# Patient Record
Sex: Female | Born: 1950
Health system: Southern US, Community
[De-identification: ages and names within clinical notes are randomized; demographics above are authoritative.]

## PROBLEM LIST (undated history)

## (undated) DIAGNOSIS — E785 Hyperlipidemia, unspecified: Secondary | ICD-10-CM

## (undated) DIAGNOSIS — E039 Hypothyroidism, unspecified: Secondary | ICD-10-CM

## (undated) DIAGNOSIS — M25511 Pain in right shoulder: Secondary | ICD-10-CM

## (undated) DIAGNOSIS — F419 Anxiety disorder, unspecified: Secondary | ICD-10-CM

## (undated) DIAGNOSIS — Z780 Asymptomatic menopausal state: Secondary | ICD-10-CM

## (undated) DIAGNOSIS — Z923 Personal history of irradiation: Secondary | ICD-10-CM

## (undated) DIAGNOSIS — C801 Malignant (primary) neoplasm, unspecified: Secondary | ICD-10-CM

## (undated) DIAGNOSIS — M1990 Unspecified osteoarthritis, unspecified site: Secondary | ICD-10-CM

## (undated) DIAGNOSIS — I10 Essential (primary) hypertension: Secondary | ICD-10-CM

## (undated) DIAGNOSIS — F32A Depression, unspecified: Secondary | ICD-10-CM

## (undated) DIAGNOSIS — M810 Age-related osteoporosis without current pathological fracture: Secondary | ICD-10-CM

## (undated) DIAGNOSIS — Z9221 Personal history of antineoplastic chemotherapy: Secondary | ICD-10-CM

## (undated) DIAGNOSIS — J45909 Unspecified asthma, uncomplicated: Secondary | ICD-10-CM

## (undated) HISTORY — DX: Anxiety disorder, unspecified: F41.9

## (undated) HISTORY — PX: OTHER SURGICAL HISTORY: SHX169

## (undated) HISTORY — DX: Hyperlipidemia, unspecified: E78.5

## (undated) HISTORY — DX: Pain in right shoulder: M25.511

## (undated) HISTORY — DX: Asymptomatic menopausal state: Z78.0

## (undated) HISTORY — DX: Unspecified osteoarthritis, unspecified site: M19.90

## (undated) HISTORY — DX: Unspecified asthma, uncomplicated: J45.909

---

## 2005-05-24 ENCOUNTER — Ambulatory Visit: Payer: Self-pay | Admitting: Unknown Physician Specialty

## 2007-05-19 ENCOUNTER — Ambulatory Visit: Payer: Self-pay | Admitting: Unknown Physician Specialty

## 2008-10-28 ENCOUNTER — Ambulatory Visit: Payer: Self-pay | Admitting: Unknown Physician Specialty

## 2010-01-12 ENCOUNTER — Ambulatory Visit: Payer: Self-pay | Admitting: Unknown Physician Specialty

## 2010-04-06 ENCOUNTER — Ambulatory Visit: Payer: Self-pay | Admitting: Internal Medicine

## 2011-11-05 ENCOUNTER — Ambulatory Visit: Payer: Self-pay | Admitting: Unknown Physician Specialty

## 2012-12-10 ENCOUNTER — Ambulatory Visit: Payer: Self-pay | Admitting: Physician Assistant

## 2013-08-25 DIAGNOSIS — F419 Anxiety disorder, unspecified: Secondary | ICD-10-CM | POA: Insufficient documentation

## 2013-08-25 DIAGNOSIS — I10 Essential (primary) hypertension: Secondary | ICD-10-CM | POA: Insufficient documentation

## 2013-08-25 DIAGNOSIS — M810 Age-related osteoporosis without current pathological fracture: Secondary | ICD-10-CM | POA: Insufficient documentation

## 2013-08-25 DIAGNOSIS — E559 Vitamin D deficiency, unspecified: Secondary | ICD-10-CM | POA: Insufficient documentation

## 2013-08-25 DIAGNOSIS — E785 Hyperlipidemia, unspecified: Secondary | ICD-10-CM | POA: Insufficient documentation

## 2014-01-03 ENCOUNTER — Ambulatory Visit: Payer: Self-pay | Admitting: Physician Assistant

## 2014-07-28 DIAGNOSIS — I10 Essential (primary) hypertension: Secondary | ICD-10-CM | POA: Insufficient documentation

## 2016-01-25 ENCOUNTER — Other Ambulatory Visit: Payer: Self-pay | Admitting: Internal Medicine

## 2016-01-25 DIAGNOSIS — E7800 Pure hypercholesterolemia, unspecified: Secondary | ICD-10-CM | POA: Insufficient documentation

## 2016-01-25 DIAGNOSIS — M810 Age-related osteoporosis without current pathological fracture: Secondary | ICD-10-CM | POA: Insufficient documentation

## 2016-01-25 DIAGNOSIS — Z1239 Encounter for other screening for malignant neoplasm of breast: Secondary | ICD-10-CM

## 2016-02-15 ENCOUNTER — Other Ambulatory Visit: Payer: Self-pay | Admitting: Unknown Physician Specialty

## 2016-02-15 DIAGNOSIS — D104 Benign neoplasm of tonsil: Secondary | ICD-10-CM

## 2016-02-19 ENCOUNTER — Ambulatory Visit: Payer: Self-pay

## 2016-02-22 ENCOUNTER — Ambulatory Visit
Admission: RE | Admit: 2016-02-22 | Discharge: 2016-02-22 | Disposition: A | Discharge status: Home / Self Care | Payer: BLUE CROSS/BLUE SHIELD | Source: Ambulatory Visit | Attending: Unknown Physician Specialty | Admitting: Unknown Physician Specialty

## 2016-02-22 DIAGNOSIS — D104 Benign neoplasm of tonsil: Secondary | ICD-10-CM

## 2016-02-22 DIAGNOSIS — I6523 Occlusion and stenosis of bilateral carotid arteries: Secondary | ICD-10-CM | POA: Insufficient documentation

## 2016-02-22 HISTORY — DX: Essential (primary) hypertension: I10

## 2016-02-22 MED ORDER — IOPAMIDOL (ISOVUE-300) INJECTION 61%
75.0000 mL | Freq: Once | INTRAVENOUS | Status: AC | PRN
  Administered 2016-02-22: 75 mL via INTRAVENOUS

## 2016-02-26 DIAGNOSIS — C801 Malignant (primary) neoplasm, unspecified: Secondary | ICD-10-CM | POA: Insufficient documentation

## 2016-02-26 HISTORY — DX: Malignant (primary) neoplasm, unspecified: C80.1

## 2016-03-01 ENCOUNTER — Other Ambulatory Visit: Payer: Self-pay | Admitting: Unknown Physician Specialty

## 2016-03-01 DIAGNOSIS — C099 Malignant neoplasm of tonsil, unspecified: Secondary | ICD-10-CM

## 2016-03-06 DIAGNOSIS — C099 Malignant neoplasm of tonsil, unspecified: Secondary | ICD-10-CM | POA: Insufficient documentation

## 2016-03-06 NOTE — Progress Notes (Signed)
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  INTERVAL HISTORY: Patient is a 65 year old female who initially presented with complaint of sore throat. Subsequent CT scan revealed a tonsillar mass and biopsy confirmed squamous cell carcinoma. She continues to have mild left neck tenderness, but otherwise feels well. She has no neurologic complaints. She denies any recent fevers. She has good appetite and denies weight loss. She denies any dysphasia. She has no chest pain or shortness of breath. She denies any nausea, vomiting, constipation, or diarrhea. She has no urinary complaints. Patient otherwise feels well and offers no further specific complaints.  REVIEW OF SYSTEMS:   Review of Systems  Constitutional: Negative.  Negative for fever, malaise/fatigue and weight loss.  HENT: Positive for sore throat.   Respiratory: Negative.  Negative for cough and shortness of breath.   Cardiovascular: Negative.  Negative for chest pain and leg swelling.  Gastrointestinal: Negative.  Negative for abdominal pain.  Genitourinary: Negative.   Musculoskeletal: Negative.   Neurological: Negative.  Negative for weakness.  Psychiatric/Behavioral: Negative.  The patient is not nervous/anxious.     As per HPI. Otherwise, a complete review of systems is negative.  PAST MEDICAL HISTORY: Past Medical History:  Diagnosis Date  . Arthritis   . Hypertension   . Postmenopausal     PAST SURGICAL HISTORY: No past surgical history on file.  FAMILY HISTORY: Negative and noncontributory. No reported history of malignancy or chronic disease.  ADVANCED DIRECTIVES (Y/N):  N  HEALTH MAINTENANCE: Social History  Substance Use Topics  . Smoking status: Never Smoker  .  Smokeless tobacco: Never Used  . Alcohol use Not on file     Colonoscopy:  PAP:  Bone density:  Lipid panel:  No Known Allergies  Current Outpatient Prescriptions  Medication Sig Dispense Refill  . alendronate (FOSAMAX) 70 MG tablet Take 70 mg by mouth once a week. Take with a full glass of water on an empty stomach.    . ALPRAZolam (XANAX) 1 MG tablet Take 1 mg by mouth at bedtime as needed for anxiety.    Marland Kitchen amLODipine (NORVASC) 10 MG tablet Take 10 mg by mouth daily.    . Cholecalciferol (VITAMIN D) 2000 units tablet Take 2,000 Units by mouth daily.    . clobetasol cream (TEMOVATE) [id number] % Apply 1 application topically 4 (four) times a week.    . co-enzyme Q-10 30 MG capsule Take 100 mg by mouth daily.    . finasteride (PROSCAR) 5 MG tablet Take 5 mg by mouth daily.    . hydrochlorothiazide (HYDRODIURIL) 25 MG tablet Take 25 mg by mouth daily.    Marland Kitchen lisinopril (PRINIVIL,ZESTRIL) 40 MG tablet Take 40 mg by mouth daily.    . niacin (NIASPAN) 500 MG CR tablet Take 500 mg by mouth at bedtime.    . Omega-3 Fatty Acids (FISH OIL OMEGA-3 PO) Take 1 capsule by mouth 4 (four) times a week.    . Pioglitazone HCl (ACTOS PO) Take 7.5 mg by mouth daily.    . pravastatin (PRAVACHOL) 40 MG tablet Take 40 mg by mouth daily.     No current facility-administered medications for this visit.     OBJECTIVE: Vitals:   03/08/16 1350  BP: (!) 146/77  Pulse: 84  Resp: 18  Temp:  99.2 F (37.3 C)     Body mass index is 24.76 kg/m.    ECOG FS:0 - Asymptomatic  General: Well-developed, well-nourished, no acute distress. Eyes: Pink conjunctiva, anicteric sclera. HEENT: Normocephalic, moist mucous membranes, mass seen on left tonsillar area. No palpable neck lymphadenopathy. Lungs: Clear to auscultation bilaterally. Heart: Regular rate and rhythm. No rubs, murmurs, or gallops. Abdomen: Soft, nontender, nondistended. No organomegaly noted, normoactive bowel sounds. Musculoskeletal: No edema, cyanosis,  or clubbing. Neuro: Alert, answering all questions appropriately. Cranial nerves grossly intact. Skin: No rashes or petechiae noted. Psych: Normal affect. Lymphatics: No calvicular, axillary or inguinal LAD.   LAB RESULTS:  No results found for: NA, K, CL, CO2, GLUCOSE, BUN, CREATININE, CALCIUM, PROT, ALBUMIN, AST, ALT, ALKPHOS, BILITOT, GFRNONAA, GFRAA  No results found for: WBC, NEUTROABS, HGB, HCT, MCV, PLT   STUDIES: Ct Soft Tissue Neck W Contrast  Addendum Date: 02/22/2016   ADDENDUM REPORT: 02/22/2016 11:22 ADDENDUM: Study discussed by telephone with Dr. Beverly Gust on 02/22/2016 At 1110 hours. Electronically Signed   By: Genevie Ann M.D.   On: 02/22/2016 11:22   Result Date: 02/22/2016 CLINICAL DATA:  65 year old female with non painful left submandibular region swelling since November. No known injury. Tonsillar mass. Initial encounter. EXAM: CT NECK WITH CONTRAST TECHNIQUE: Multidetector CT imaging of the neck was performed using the standard protocol following the bolus administration of intravenous contrast. CONTRAST:  27mL ISOVUE-300 IOPAMIDOL (ISOVUE-300) INJECTION 61% COMPARISON:  None. FINDINGS: Pharynx and larynx: The larynx appears normal. The hypopharynx and tongue base are within normal limits. There is a left palatine tonsil rounded soft tissue mass with indistinct margins. Size is estimated at 27 x 26 x 34 mm (AP by transverse by CC) . There is partial effacement of the left parapharyngeal space. The right palatine tonsil appears normal. The nasopharynx appears normal. Negative right parapharyngeal space. Negative retropharyngeal space except for an asymmetrically enlarged left retropharyngeal lymph node (series 2, image 21). See lymph node findings below. Salivary glands: Negative sublingual space. The right submandibular gland and both parotid glands are within normal limits. There is perhaps minimal asymmetric enlargement of the left submandibular gland, and the gland is  in proximity to malignant left level 2 lymph nodes, but no submandibular gland tumor or abnormality is identified. Thyroid: Negative. Lymph nodes: Palpable area of concern marked on series 2, image 42 corresponds to a partially cystic/necrotic left level IIa lymph node measuring up to 25 mm long axis (11 mm short axis). There is a nearby left level IIb 6 mm short axis node (14 mm long axis) which is asymmetric and highly suspicious. There are also multiple small but conspicuous left level III nodes (compare series compare sagittal images 108 through 110 on the left with the contralateral images 67 and 68). There is also an asymmetric and highly suspicious left retropharyngeal node measuring 5 mm short axis (series 2, images 20 and 21). No level 1, level 4 or level 5 lymphadenopathy. Vascular: Major vascular structures in the neck and at the skullbase are patent. There is moderate to severe calcified carotid atherosclerosis at the right ICA origin which appears likely hemodynamically significant (series 2, image 60). Limited intracranial: Negative. Visualized orbits: Negative. Mastoids and visualized paranasal sinuses: Trace ethmoid and maxillary sinus mucosal thickening. Tympanic cavities, mastoids, and pneumatized sphenoid wings and petrous apex air cells are clear. Skeleton: Dentition appears within normal limits. Degenerative changes in the cervical spine including C4-C5 spondylolisthesis. There is left side C2-C3 posterior element ankylosis. Widespread  cervical facet arthropathy. Benign-appearing and chronic appearing T5 compression fracture. No acute or suspicious osseous lesion identified. Upper chest: No superior mediastinal lymphadenopathy. Visible axillary lymph nodes are within normal limits. Negative lung apices. IMPRESSION: 1. Left tonsillar mass estimated at 3.4 cm. Malignant left level II lymph node(s) - the largest up to 25 mm corresponds to the palpable area of concern. Other smaller but suspicious  level II plus left retropharyngeal and level 3 lymph nodes. 2. Constellation consistent with Left Oropharyngeal Squamous Cell Carcinoma imaging stage T2, N1 vs. N2b. PET-CT could be used to further characterize the extent of lymph node metastases. 3. Right greater than left calcified carotid atherosclerosis, likely hemodynamically significant at the right ICA origin. 4. Chronic and benign appearing T5 compression fracture. Advanced degenerative changes in the cervical spine. Electronically Signed: By: Genevie Ann M.D. 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  CT and pathology results reviewed independently. Patient has multiple suspicious ipsilateral lymph nodes that could possibly increase her staging to T2N2bM0 which would be stage IVa disease. Patient has a PET scan scheduled for March 13, 2016 to complete the staging workup. Given the clinical stage of her disease, she will benefit from concurrent XRT and chemotherapy using weekly cisplatin. Pending results of her PET scan, would not recommend induction chemotherapy at this time.  Will arrange consultation with radiation oncology after patient's scheduled PET scan next week. Will also of a referral to dietary in the near future. Patient will follow-up in 1 week after her PET scan to discuss her final staging results and treatment planning.  Approximately 45 minutes was spent in discussion of which greater than 50% was consultation.  Patient expressed understanding and was in agreement with this plan. She also understands that She can call clinic at any time with any questions, concerns, or complaints.   Tonsillar cancer ([place])   Staging form: Pharynx - HPV-Mediated Oropharynx, AJCC 8th Edition   - Clinical stage from 03/08/2016: Stage I (cT2, cN1, cM0, p16: Positive) - Signed by Lloyd Huger, MD on  03/08/2016  Lloyd Huger, MD   03/08/2016 2:56 PM

## 2016-03-07 ENCOUNTER — Ambulatory Visit: Payer: Self-pay

## 2016-03-08 ENCOUNTER — Encounter: Payer: Self-pay | Admitting: Oncology

## 2016-03-08 ENCOUNTER — Inpatient Hospital Stay: Payer: BLUE CROSS/BLUE SHIELD | Attending: Oncology | Admitting: Oncology

## 2016-03-08 DIAGNOSIS — I10 Essential (primary) hypertension: Secondary | ICD-10-CM | POA: Diagnosis not present

## 2016-03-08 DIAGNOSIS — M129 Arthropathy, unspecified: Secondary | ICD-10-CM | POA: Diagnosis not present

## 2016-03-08 DIAGNOSIS — R590 Localized enlarged lymph nodes: Secondary | ICD-10-CM

## 2016-03-08 DIAGNOSIS — M47812 Spondylosis without myelopathy or radiculopathy, cervical region: Secondary | ICD-10-CM | POA: Diagnosis not present

## 2016-03-08 DIAGNOSIS — I6523 Occlusion and stenosis of bilateral carotid arteries: Secondary | ICD-10-CM | POA: Insufficient documentation

## 2016-03-08 DIAGNOSIS — C099 Malignant neoplasm of tonsil, unspecified: Secondary | ICD-10-CM | POA: Diagnosis not present

## 2016-03-08 NOTE — Progress Notes (Signed)
New evaluation for head and neck cancer. States is having mild discomfort to left side of neck.

## 2016-03-13 ENCOUNTER — Ambulatory Visit
Admission: RE | Admit: 2016-03-13 | Discharge: 2016-03-13 | Disposition: A | Discharge status: Home / Self Care | Payer: BLUE CROSS/BLUE SHIELD | Source: Ambulatory Visit | Attending: Unknown Physician Specialty | Admitting: Unknown Physician Specialty

## 2016-03-13 DIAGNOSIS — C099 Malignant neoplasm of tonsil, unspecified: Secondary | ICD-10-CM | POA: Diagnosis present

## 2016-03-13 HISTORY — DX: Malignant (primary) neoplasm, unspecified: C80.1

## 2016-03-13 LAB — GLUCOSE, CAPILLARY: GLUCOSE-CAPILLARY: 103 mg/dL — AB (ref 65–99)

## 2016-03-13 MED ORDER — FLUDEOXYGLUCOSE F - 18 (FDG) INJECTION
12.8100 | Freq: Once | INTRAVENOUS | Status: AC | PRN
  Administered 2016-03-13: 12.81 via INTRAVENOUS

## 2016-03-13 NOTE — Progress Notes (Signed)
[name]  [phone] [phone]  ID: Helen Snow OB: [birth year]  MR#: [id number]  [id number]  Patient Care Team: Glendon Axe, MD as PCP - General (Internal Medicine)  CHIEF COMPLAINT: Clinical stage IVa (T2,N2c,M0) left tonsillar squamous cell carcinoma.  INTERVAL HISTORY: Patient returns to clinic today for further evaluation, discussion of her PET scan results, and treatment planning. She continues to have left neck tenderness. She is also complained of decreased hearing and "drainage" from her left ear. She has no neurologic complaints. She denies any recent fevers. She has a good appetite and denies weight loss. She denies any dysphasia. She has no chest pain or shortness of breath. She denies any nausea, vomiting, constipation, or diarrhea. She has no urinary complaints. Patient otherwise feels well and offers no further specific complaints.  REVIEW OF SYSTEMS:   Review of Systems  Constitutional: Negative.  Negative for fever, malaise/fatigue and weight loss.  HENT: Positive for ear pain, hearing loss and sore throat.   Respiratory: Negative.  Negative for cough and shortness of breath.   Cardiovascular: Negative.  Negative for chest pain and leg swelling.  Gastrointestinal: Negative.  Negative for abdominal pain.  Genitourinary: Negative.   Musculoskeletal: Negative.   Neurological: Negative.  Negative for weakness.  Psychiatric/Behavioral: Negative.  The patient is not nervous/anxious.     As per HPI. Otherwise, a complete review of systems is negative.  PAST MEDICAL HISTORY: Past Medical History:  Diagnosis Date  . Arthritis   . Cancer ([place]) 03/13/2016   Malignant neoplasm of tonsil  . Hypertension   . Postmenopausal     PAST SURGICAL HISTORY: No past surgical history on file.  FAMILY HISTORY: Negative and noncontributory. No reported history of malignancy or chronic disease.  ADVANCED DIRECTIVES (Y/N):  N  HEALTH  MAINTENANCE: Social History  Substance Use Topics  . Smoking status: Never Smoker  . Smokeless tobacco: Never Used  . Alcohol use Not on file     Colonoscopy:  PAP:  Bone density:  Lipid panel:  No Known Allergies  Current Outpatient Prescriptions  Medication Sig Dispense Refill  . alendronate (FOSAMAX) 70 MG tablet Take 70 mg by mouth once a week. Take with a full glass of water on an empty stomach.    . ALPRAZolam (XANAX) 1 MG tablet Take 1 mg by mouth at bedtime as needed for anxiety.    Marland Kitchen amLODipine (NORVASC) 10 MG tablet Take 10 mg by mouth daily.    . Cholecalciferol (VITAMIN D) 2000 units tablet Take 2,000 Units by mouth daily.    . clobetasol cream (TEMOVATE) [id number] % Apply 1 application topically 4 (four) times a week.    . co-enzyme Q-10 30 MG capsule Take 100 mg by mouth daily.    . finasteride (PROSCAR) 5 MG tablet Take 5 mg by mouth daily.    . hydrochlorothiazide (HYDRODIURIL) 25 MG tablet Take 25 mg by mouth daily.    Marland Kitchen lisinopril (PRINIVIL,ZESTRIL) 40 MG tablet Take 40 mg by mouth daily.    . niacin (NIASPAN) 500 MG CR tablet Take 500 mg by mouth at bedtime.    . Omega-3 Fatty Acids (FISH OIL OMEGA-3 PO) Take 1 capsule by mouth 4 (four) times a week.    . Pioglitazone HCl (ACTOS PO) Take 7.5 mg by mouth daily.    . pravastatin (PRAVACHOL) 40 MG tablet Take 40 mg by mouth daily.     No current facility-administered medications for this visit.     OBJECTIVE:  There were no vitals filed for this visit.   There is no height or weight on file to calculate BMI.    ECOG FS:0 - Asymptomatic  General: Well-developed, well-nourished, no acute distress. Eyes: Pink conjunctiva, anicteric sclera. HEENT: Normocephalic, moist mucous membranes, mass seen on left tonsillar area. No palpable neck lymphadenopathy. Fluid seen behind left tympanic membrane. Lungs: Clear to auscultation bilaterally. Heart: Regular rate and rhythm. No rubs, murmurs, or gallops. Abdomen: Soft,  nontender, nondistended. No organomegaly noted, normoactive bowel sounds. Musculoskeletal: No edema, cyanosis, or clubbing. Neuro: Alert, answering all questions appropriately. Cranial nerves grossly intact. Skin: No rashes or petechiae noted. Psych: Normal affect. Lymphatics: No calvicular, axillary or inguinal LAD.   LAB RESULTS:  No results found for: NA, K, CL, CO2, GLUCOSE, BUN, CREATININE, CALCIUM, PROT, ALBUMIN, AST, ALT, ALKPHOS, BILITOT, GFRNONAA, GFRAA  No results found for: WBC, NEUTROABS, HGB, HCT, MCV, PLT   STUDIES: Ct Soft Tissue Neck W Contrast  Addendum Date: 02/22/2016   ADDENDUM REPORT: 02/22/2016 11:22 ADDENDUM: Study discussed by telephone with Dr. Beverly Gust on 02/22/2016 At 1110 hours. Electronically Signed   By: Genevie Ann M.D.   On: 02/22/2016 11:22   Result Date: 02/22/2016 CLINICAL DATA:  66 year old female with non painful left submandibular region swelling since November. No known injury. Tonsillar mass. Initial encounter. EXAM: CT NECK WITH CONTRAST TECHNIQUE: Multidetector CT imaging of the neck was performed using the standard protocol following the bolus administration of intravenous contrast. CONTRAST:  61mL ISOVUE-300 IOPAMIDOL (ISOVUE-300) INJECTION 61% COMPARISON:  None. FINDINGS: Pharynx and larynx: The larynx appears normal. The hypopharynx and tongue base are within normal limits. There is a left palatine tonsil rounded soft tissue mass with indistinct margins. Size is estimated at 27 x 26 x 34 mm (AP by transverse by CC) . There is partial effacement of the left parapharyngeal space. The right palatine tonsil appears normal. The nasopharynx appears normal. Negative right parapharyngeal space. Negative retropharyngeal space except for an asymmetrically enlarged left retropharyngeal lymph node (series 2, image 21). See lymph node findings below. Salivary glands: Negative sublingual space. The right submandibular gland and both parotid glands are within  normal limits. There is perhaps minimal asymmetric enlargement of the left submandibular gland, and the gland is in proximity to malignant left level 2 lymph nodes, but no submandibular gland tumor or abnormality is identified. Thyroid: Negative. Lymph nodes: Palpable area of concern marked on series 2, image 42 corresponds to a partially cystic/necrotic left level IIa lymph node measuring up to 25 mm long axis (11 mm short axis). There is a nearby left level IIb 6 mm short axis node (14 mm long axis) which is asymmetric and highly suspicious. There are also multiple small but conspicuous left level III nodes (compare series compare sagittal images 108 through 110 on the left with the contralateral images 67 and 68). There is also an asymmetric and highly suspicious left retropharyngeal node measuring 5 mm short axis (series 2, images 20 and 21). No level 1, level 4 or level 5 lymphadenopathy. Vascular: Major vascular structures in the neck and at the skullbase are patent. There is moderate to severe calcified carotid atherosclerosis at the right ICA origin which appears likely hemodynamically significant (series 2, image 60). Limited intracranial: Negative. Visualized orbits: Negative. Mastoids and visualized paranasal sinuses: Trace ethmoid and maxillary sinus mucosal thickening. Tympanic cavities, mastoids, and pneumatized sphenoid wings and petrous apex air cells are clear. Skeleton: Dentition appears within normal limits. Degenerative changes in the cervical  spine including C4-C5 spondylolisthesis. There is left side C2-C3 posterior element ankylosis. Widespread cervical facet arthropathy. Benign-appearing and chronic appearing T5 compression fracture. No acute or suspicious osseous lesion identified. Upper chest: No superior mediastinal lymphadenopathy. Visible axillary lymph nodes are within normal limits. Negative lung apices. IMPRESSION: 1. Left tonsillar mass estimated at 3.4 cm. Malignant left level II  lymph node(s) - the largest up to 25 mm corresponds to the palpable area of concern. Other smaller but suspicious level II plus left retropharyngeal and level 3 lymph nodes. 2. Constellation consistent with Left Oropharyngeal Squamous Cell Carcinoma imaging stage T2, N1 vs. N2b. PET-CT could be used to further characterize the extent of lymph node metastases. 3. Right greater than left calcified carotid atherosclerosis, likely hemodynamically significant at the right ICA origin. 4. Chronic and benign appearing T5 compression fracture. Advanced degenerative changes in the cervical spine. Electronically Signed: By: Genevie Ann M.D. On: 02/22/2016 08:45   Nm Pet Image Initial (pi) Skull Base To Thigh  Result Date: 03/13/2016 CLINICAL DATA:  Initial treatment strategy for head neck carcinoma. Malignant neoplasm of the LEFT tonsil. EXAM: NUCLEAR MEDICINE PET SKULL BASE TO THIGH TECHNIQUE: 02/2019 mCi F-18 FDG was injected intravenously. Full-ring PET imaging was performed from the skull base to thigh after the radiotracer. CT data was obtained and used for attenuation correction and anatomic localization. FASTING BLOOD GLUCOSE:  Value: 103 mg/dl COMPARISON:  Neck CT 02/22/2016 FINDINGS: NECK Hypermetabolic mass centered in the LEFT tonsil measures 2.2 cm with SUV max equal 11. There is hypermetabolic thickening of the RIGHT tonsil measuring 1.0 cm with SUV max equal 5.8. There bilateral hypermetabolic level 2 lymph nodes on image 34 of the fused data set. The RIGHT lymph node measures 12 mm with SUV max 4.3. The LEFT level 2 lymph node measures 12 mm (image 33, series 4) with SUV max equal 3.6. There is a more superior LEFT level 2 new lymph node measuring 7 mm (image 25, series 4) with intense metabolic activity for size (SUV max equal 3.8. Incidental note of hypermetabolic brown fat in the posterior neck sub occipital region. CHEST Hypermetabolic brown fat within the supraclavicular and sub pectoralis muscles as well as  along the spine (benign finding). No hypermetabolic axillary or mediastinal lymph nodes. No suspicious pulmonary nodules. ABDOMEN/PELVIS No abnormal hypermetabolic activity within the liver, pancreas, adrenal glands, or spleen. No hypermetabolic lymph nodes in the abdomen or pelvis. Uterus normal. SKELETON No focal hypermetabolic activity to suggest skeletal metastasis. IMPRESSION: 1. Hypermetabolic mass in the LEFT tonsil consists with primary carcinoma. 2. Hypermetabolic thickening in the RIGHT tonsillar region. Cannot exclude malignancy. Consider biopsy. 3. Bilateral level II hypermetabolic cervical lymph nodes. Two nodes on the LEFT and one on the RIGHT. 4. No evidence of distant metastatic nodal disease or soft tissue metastasis. Electronically Signed   By: Suzy Bouchard M.D.   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 PLAN:    1. 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 PET scan results reviewed independently and reported as above with possible disease in right tonsil as well as bilateral lymphadenopathy. Given the clinical stage of her disease, she will benefit from concurrent XRT and chemotherapy using weekly cisplatin. Would not recommend induction chemotherapy at this time. Patient also evaluated by radiation oncology today. Port placement was discussed, but patient declined at this time. Return to clinic on March 27, 2016 to initiate cycle 1 of  weekly cisplatin.   Approximately 30 minutes was spent in discussion of which greater than 50% was consultation.  Patient expressed understanding and was in agreement with this plan. She also understands that She can call clinic at any time with any questions, concerns, or complaints.    Lloyd Huger, MD   03/13/2016 11:39 PM

## 2016-03-14 ENCOUNTER — Ambulatory Visit
Admission: RE | Admit: 2016-03-14 | Discharge: 2016-03-14 | Disposition: A | Discharge status: Home / Self Care | Payer: BLUE CROSS/BLUE SHIELD | Source: Ambulatory Visit | Attending: Radiation Oncology | Admitting: Radiation Oncology

## 2016-03-14 ENCOUNTER — Inpatient Hospital Stay: Payer: BLUE CROSS/BLUE SHIELD | Attending: Oncology | Admitting: Oncology

## 2016-03-14 ENCOUNTER — Encounter: Payer: Self-pay | Admitting: Unknown Physician Specialty

## 2016-03-14 ENCOUNTER — Encounter: Payer: Self-pay | Admitting: Radiation Oncology

## 2016-03-14 VITALS — BP 132/65 | HR 88 | Temp 100.1°F | Resp 18 | Wt 152.1 lb

## 2016-03-14 DIAGNOSIS — G4700 Insomnia, unspecified: Secondary | ICD-10-CM | POA: Insufficient documentation

## 2016-03-14 DIAGNOSIS — C099 Malignant neoplasm of tonsil, unspecified: Secondary | ICD-10-CM

## 2016-03-14 DIAGNOSIS — M129 Arthropathy, unspecified: Secondary | ICD-10-CM | POA: Diagnosis not present

## 2016-03-14 DIAGNOSIS — R590 Localized enlarged lymph nodes: Secondary | ICD-10-CM | POA: Diagnosis not present

## 2016-03-14 DIAGNOSIS — J029 Acute pharyngitis, unspecified: Secondary | ICD-10-CM | POA: Diagnosis not present

## 2016-03-14 DIAGNOSIS — Z923 Personal history of irradiation: Secondary | ICD-10-CM | POA: Insufficient documentation

## 2016-03-14 DIAGNOSIS — M47812 Spondylosis without myelopathy or radiculopathy, cervical region: Secondary | ICD-10-CM | POA: Insufficient documentation

## 2016-03-14 DIAGNOSIS — H9192 Unspecified hearing loss, left ear: Secondary | ICD-10-CM | POA: Diagnosis not present

## 2016-03-14 DIAGNOSIS — I10 Essential (primary) hypertension: Secondary | ICD-10-CM | POA: Diagnosis not present

## 2016-03-14 DIAGNOSIS — F419 Anxiety disorder, unspecified: Secondary | ICD-10-CM | POA: Diagnosis not present

## 2016-03-14 DIAGNOSIS — Z79899 Other long term (current) drug therapy: Secondary | ICD-10-CM | POA: Insufficient documentation

## 2016-03-14 DIAGNOSIS — R110 Nausea: Secondary | ICD-10-CM | POA: Diagnosis not present

## 2016-03-14 DIAGNOSIS — H9202 Otalgia, left ear: Secondary | ICD-10-CM | POA: Diagnosis not present

## 2016-03-14 DIAGNOSIS — C090 Malignant neoplasm of tonsillar fossa: Secondary | ICD-10-CM | POA: Diagnosis not present

## 2016-03-14 DIAGNOSIS — I6523 Occlusion and stenosis of bilateral carotid arteries: Secondary | ICD-10-CM | POA: Diagnosis not present

## 2016-03-14 DIAGNOSIS — Z5111 Encounter for antineoplastic chemotherapy: Secondary | ICD-10-CM | POA: Insufficient documentation

## 2016-03-14 DIAGNOSIS — R51 Headache: Secondary | ICD-10-CM | POA: Diagnosis not present

## 2016-03-14 DIAGNOSIS — K5900 Constipation, unspecified: Secondary | ICD-10-CM | POA: Insufficient documentation

## 2016-03-14 DIAGNOSIS — R5383 Other fatigue: Secondary | ICD-10-CM | POA: Insufficient documentation

## 2016-03-14 DIAGNOSIS — Z510 Encounter for antineoplastic radiation therapy: Secondary | ICD-10-CM | POA: Insufficient documentation

## 2016-03-14 DIAGNOSIS — H6690 Otitis media, unspecified, unspecified ear: Secondary | ICD-10-CM

## 2016-03-14 MED ORDER — AMOXICILLIN-POT CLAVULANATE 875-125 MG PO TABS: 1.0000 | ORAL_TABLET | Freq: Two times a day (BID) | ORAL | 0 refills | Status: DC

## 2016-03-14 NOTE — Consult Note (Signed)
NEW PATIENT EVALUATION  Name: Helen Snow  MRN: [id number]  Date:   03/14/2016     DOB: 1950-12-02   This 66 y.o. 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  REFERRING PHYSICIAN: Glendon Axe, MD  CHIEF COMPLAINT:  Chief Complaint  Patient presents with  . Cancer    Tonsil cancer    DIAGNOSIS: The encounter diagnosis was Tonsillar cancer ([place]).   PREVIOUS INVESTIGATIONS:  PET CT scan reviewed Pathology reports reviewed Clinical notes reviewed  HPI: Patient is a 66 year old female who presented with several month history of sore throat and increasing left ear pain. CT scan showed a left tonsillar mass. Patient was seen by ENT and was noticed to have a left tonsillar mass biopsy positive for squamous cell carcinoma HPV positive. She's been seen by medical oncology. PET CT scan was ordered showing hypermetabolic activity left tonsillar primary region also some hypermetabolic activity and thickening in the right tonsillar region which cannot exclude malignancy. She had bilateral lateral level to hypermetabolic cervical lymph nodes 2 on the left and one on the right. No evidence of distant metastatic disease was noted. She is now referred to radiation oncology for consideration of treatment and opinion. She's been having some drainage from her left ear. She's recently been put on narcotic analgesics for ear pain.  PLANNED TREATMENT REGIMEN: Concurrent chemoradiation  PAST MEDICAL HISTORY:  has a past medical history of Arthritis; Cancer ([place]) (03/13/2016); Hypertension; and Postmenopausal.    PAST SURGICAL HISTORY: History reviewed. No pertinent surgical history.  FAMILY HISTORY: family history is not on file.  SOCIAL HISTORY:  reports that she has never smoked. She has never used smokeless tobacco.  ALLERGIES: Patient has no known allergies.  MEDICATIONS:  Current Outpatient  Prescriptions  Medication Sig Dispense Refill  . alendronate (FOSAMAX) 70 MG tablet Take 70 mg by mouth once a week. Take with a full glass of water on an empty stomach.    . ALPRAZolam (XANAX) 1 MG tablet Take 1 mg by mouth at bedtime as needed for anxiety.    Marland Kitchen amLODipine (NORVASC) 10 MG tablet Take 10 mg by mouth daily.    . Cholecalciferol (VITAMIN D) 2000 units tablet Take 2,000 Units by mouth daily.    . clobetasol cream (TEMOVATE) [id number] % Apply 1 application topically 4 (four) times a week.    . co-enzyme Q-10 30 MG capsule Take 100 mg by mouth daily.    . finasteride (PROSCAR) 5 MG tablet Take 5 mg by mouth daily.    . hydrochlorothiazide (HYDRODIURIL) 25 MG tablet Take 25 mg by mouth daily.    Marland Kitchen lisinopril (PRINIVIL,ZESTRIL) 40 MG tablet Take 40 mg by mouth daily.    . niacin (NIASPAN) 500 MG CR tablet Take 500 mg by mouth at bedtime.    . Omega-3 Fatty Acids (FISH OIL OMEGA-3 PO) Take 1 capsule by mouth 4 (four) times a week.    Marland Kitchen oxyCODONE-acetaminophen (PERCOCET/ROXICET) 5-325 MG tablet   0  . Pioglitazone HCl (ACTOS PO) Take 7.5 mg by mouth daily.    . pravastatin (PRAVACHOL) 40 MG tablet Take 40 mg by mouth daily.     No current facility-administered medications for this encounter.     ECOG PERFORMANCE STATUS:  1 - Symptomatic but completely ambulatory  REVIEW OF SYSTEMS: Except for the ear pain and drainage from her left ear Patient denies any weight loss, fatigue,  weakness, fever, chills or night sweats. Patient denies any loss of vision, blurred vision. Patient denies any ringing  of the ears or hearing loss. No irregular heartbeat. Patient denies heart murmur or history of fainting. Patient denies any chest pain or pain radiating to her upper extremities. Patient denies any shortness of breath, difficulty breathing at night, cough or hemoptysis. Patient denies any swelling in the lower legs. Patient denies any nausea vomiting, vomiting of blood, or coffee ground material in  the vomitus. Patient denies any stomach pain. Patient states has had normal bowel movements no significant constipation or diarrhea. Patient denies any dysuria, hematuria or significant nocturia. Patient denies any problems walking, swelling in the joints or loss of balance. Patient denies any skin changes, loss of hair or loss of weight. Patient denies any excessive worrying or anxiety or significant depression. Patient denies any problems with insomnia. Patient denies excessive thirst, polyuria, polydipsia. Patient denies any swollen glands, patient denies easy bruising or easy bleeding. Patient denies any recent infections, allergies or URI. Patient "s visual fields have not changed significantly in recent time.    PHYSICAL EXAM: BP 132/65   Pulse 88   Temp 100.1 F (37.8 C)   Resp 18   Wt 152 lb 1.9 oz (69 kg)   BMI 25.31 kg/m  Some slight tinged fluid is present in the left ear canal. Oral cavity shows bulging of the left tonsillar fossa right tonsillar fossa looks fairly normal. Indirect mirror examination shows upper airway clear vallecula and base of tongue within normal limits. Neck is clear without evidence of subject gastric cervical or supraclavicular adenopathy. Well-developed well-nourished patient in NAD. HEENT reveals PERLA, EOMI, discs not visualized.  Oral cavity is clear. No oral mucosal lesions are identified. Neck is clear without evidence of cervical or supraclavicular adenopathy. Lungs are clear to A&P. Cardiac examination is essentially unremarkable with regular rate and rhythm without murmur rub or thrill. Abdomen is benign with no organomegaly or masses noted. Motor sensory and DTR levels are equal and symmetric in the upper and lower extremities. Cranial nerves II through XII are grossly intact. Proprioception is intact. No peripheral adenopathy or edema is identified. No motor or sensory levels are noted. Crude visual fields are within normal range.  LABORATORY DATA:  Pathology reports reviewed    RADIOLOGY RESULTS: CT scan and PET CT scan reviewed   IMPRESSION: Stage III HPV positive squamous cell carcinoma the left tonsillar fossa in 66 year old female  PLAN: At this time I discussed the case personally with medical oncology I would plan concurrent chemoradiation with curative intent. I would treat her left tonsillar fossa as well as the right tonsillar fossa using IM RT radiation therapy up to 7000 cGy over 7 weeks. Would also incorporate her hypermetabolic lymph nodes that same dose. I would use PET fusion study to plan my radiation fields. Remaining lymph nodes would be treated to 5400 cGy using I MRT dose painting technique. Risks and benefits of treatment including skin reaction fatigue alteration of blood counts possible dysphasia oral mucositis skin reaction loss of taste and possible xerostomia all were discussed in detail with the patient. She seems to comprehend my treatment plan well.There will be extra effort by both professional staff as well as technical staff to coordinate and manage concurrent chemoradiation and ensuing side effects during her treatments. I personally set up and ordered CT simulation for early next week. I have again discussed the case with medical oncology and will be planning concurrent chemotherapy.  I've asked the patient to start taking a Claritin-D decongestant for some fluid in her ear and I've asked medical oncology to place her on antibiotic therapy.  I would like to take this opportunity to thank you for allowing me to participate in the care of your patient.Armstead Peaks., MD

## 2016-03-15 ENCOUNTER — Encounter: Payer: Self-pay | Admitting: Oncology

## 2016-03-15 DIAGNOSIS — Z7189 Other specified counseling: Secondary | ICD-10-CM | POA: Insufficient documentation

## 2016-03-15 MED ORDER — LIDOCAINE-PRILOCAINE 2.5-2.5 % EX CREA: TOPICAL_CREAM | CUTANEOUS | 3 refills | Status: DC

## 2016-03-15 MED ORDER — PROCHLORPERAZINE MALEATE 10 MG PO TABS: 10.0000 mg | ORAL_TABLET | Freq: Four times a day (QID) | ORAL | 1 refills | Status: DC | PRN

## 2016-03-15 MED ORDER — ONDANSETRON HCL 8 MG PO TABS: 8.0000 mg | ORAL_TABLET | Freq: Two times a day (BID) | ORAL | 1 refills | Status: DC | PRN

## 2016-03-15 NOTE — Progress Notes (Signed)
START OFF PATHWAY REGIMEN - Head and Neck  Off Pathway: Cisplatin 40 mg/m2 weekly (4 weeks per order sheet)  [id number]:Cisplatin 40 mg/m2 weekly (4 weeks per order sheet):   Administer weekly:     Cisplatin (Platinol(R)) 40 mg/m2 in 500 mL NS IV over 2 hours. *Prehydrate and consider post-hydration.* Dose Mod: None  **Always confirm dose/schedule in your pharmacy ordering system**    Patient Characteristics: Nasopharyngeal, Stage II - IVB Disease Classification: Nasopharyngeal AJCC M Stage: X AJCC N Stage: X AJCC T Stage: X Current Disease Status: No Distant Mets or Local Recurrence AJCC Stage Grouping: IVA  Intent of Therapy: Curative Intent, Discussed with Patient

## 2016-03-19 ENCOUNTER — Ambulatory Visit
Admission: RE | Admit: 2016-03-19 | Discharge: 2016-03-19 | Disposition: A | Discharge status: Home / Self Care | Payer: BLUE CROSS/BLUE SHIELD | Source: Ambulatory Visit | Attending: Radiation Oncology | Admitting: Radiation Oncology

## 2016-03-19 DIAGNOSIS — C090 Malignant neoplasm of tonsillar fossa: Secondary | ICD-10-CM | POA: Diagnosis not present

## 2016-03-20 ENCOUNTER — Ambulatory Visit: Payer: Self-pay

## 2016-03-20 NOTE — Patient Instructions (Signed)
Cisplatin injection What is this medicine? CISPLATIN (SIS pla tin) is a chemotherapy drug. It targets fast dividing cells, like cancer cells, and causes these cells to die. This medicine is used to treat many types of cancer like bladder, ovarian, and testicular cancers. This medicine may be used for other purposes; ask your health care provider or pharmacist if you have questions. COMMON BRAND NAME(S): Platinol, Platinol -AQ What should I tell my health care provider before I take this medicine? They need to know if you have any of these conditions: -blood disorders -hearing problems -kidney disease -recent or ongoing radiation therapy -an unusual or allergic reaction to cisplatin, carboplatin, other chemotherapy, other medicines, foods, dyes, or preservatives -pregnant or trying to get pregnant -breast-feeding How should I use this medicine? This drug is given as an infusion into a vein. It is administered in a hospital or clinic by a specially trained health care professional. Talk to your pediatrician regarding the use of this medicine in children. Special care may be needed. Overdosage: If you think you have taken too much of this medicine contact a poison control center or emergency room at once. NOTE: This medicine is only for you. Do not share this medicine with others. What if I miss a dose? It is important not to miss a dose. Call your doctor or health care professional if you are unable to keep an appointment. What may interact with this medicine? -dofetilide -foscarnet -medicines for seizures -medicines to increase blood counts like filgrastim, pegfilgrastim, sargramostim -probenecid -pyridoxine used with altretamine -rituximab -some antibiotics like amikacin, gentamicin, neomycin, polymyxin B, streptomycin, tobramycin -sulfinpyrazone -vaccines -zalcitabine Talk to your doctor or health care professional before taking any of these  medicines: -acetaminophen -aspirin -ibuprofen -ketoprofen -naproxen This list may not describe all possible interactions. Give your health care provider a list of all the medicines, herbs, non-prescription drugs, or dietary supplements you use. Also tell them if you smoke, drink alcohol, or use illegal drugs. Some items may interact with your medicine. What should I watch for while using this medicine? Your condition will be monitored carefully while you are receiving this medicine. You will need important blood work done while you are taking this medicine. This drug may make you feel generally unwell. This is not uncommon, as chemotherapy can affect healthy cells as well as cancer cells. Report any side effects. Continue your course of treatment even though you feel ill unless your doctor tells you to stop. In some cases, you may be given additional medicines to help with side effects. Follow all directions for their use. Call your doctor or health care professional for advice if you get a fever, chills or sore throat, or other symptoms of a cold or flu. Do not treat yourself. This drug decreases your body's ability to fight infections. Try to avoid being around people who are sick. This medicine may increase your risk to bruise or bleed. Call your doctor or health care professional if you notice any unusual bleeding. Be careful brushing and flossing your teeth or using a toothpick because you may get an infection or bleed more easily. If you have any dental work done, tell your dentist you are receiving this medicine. Avoid taking products that contain aspirin, acetaminophen, ibuprofen, naproxen, or ketoprofen unless instructed by your doctor. These medicines may hide a fever. Do not become pregnant while taking this medicine. Women should inform their doctor if they wish to become pregnant or think they might be pregnant. There is a   potential for serious side effects to an unborn child. Talk to  your health care professional or pharmacist for more information. Do not breast-feed an infant while taking this medicine. Drink fluids as directed while you are taking this medicine. This will help protect your kidneys. Call your doctor or health care professional if you get diarrhea. Do not treat yourself. What side effects may I notice from receiving this medicine? Side effects that you should report to your doctor or health care professional as soon as possible: -allergic reactions like skin rash, itching or hives, swelling of the face, lips, or tongue -signs of infection - fever or chills, cough, sore throat, pain or difficulty passing urine -signs of decreased platelets or bleeding - bruising, pinpoint red spots on the skin, black, tarry stools, nosebleeds -signs of decreased red blood cells - unusually weak or tired, fainting spells, lightheadedness -breathing problems -changes in hearing -gout pain -low blood counts - This drug may decrease the number of white blood cells, red blood cells and platelets. You may be at increased risk for infections and bleeding. -nausea and vomiting -pain, swelling, redness or irritation at the injection site -pain, tingling, numbness in the hands or feet -problems with balance, movement -trouble passing urine or change in the amount of urine Side effects that usually do not require medical attention (report to your doctor or health care professional if they continue or are bothersome): -changes in vision -loss of appetite -metallic taste in the mouth or changes in taste This list may not describe all possible side effects. Call your doctor for medical advice about side effects. You may report side effects to FDA at 1-800-FDA-1088. Where should I keep my medicine? This drug is given in a hospital or clinic and will not be stored at home. NOTE: This sheet is a summary. It may not cover all possible information. If you have questions about this medicine,  talk to your doctor, pharmacist, or health care provider.  2017 Elsevier/Gold Standard (2007-06-02 14:40:54)  

## 2016-03-21 ENCOUNTER — Inpatient Hospital Stay: Payer: BLUE CROSS/BLUE SHIELD

## 2016-03-25 DIAGNOSIS — C090 Malignant neoplasm of tonsillar fossa: Secondary | ICD-10-CM | POA: Diagnosis not present

## 2016-03-25 NOTE — Progress Notes (Deleted)
[facility]  [phone]) [phone] [phone]) [phone]  ID: Avila Sandine OB: 22-Oct-1950  MR#: [id number]  CSN#:[id number]  Patient Care Team: Glendon Axe, MD as PCP - General (Internal Medicine)  CHIEF COMPLAINT: Clinical stage IVa (T2,N2c,M0) left tonsillar squamous cell carcinoma.  INTERVAL HISTORY: Patient returns to clinic today for further evaluation, discussion of her PET scan results, and treatment planning. She continues to have left neck tenderness. She is also complained of decreased hearing and "drainage" from her left ear. She has no neurologic complaints. She denies any recent fevers. She has a good appetite and denies weight loss. She denies any dysphasia. She has no chest pain or shortness of breath. She denies any nausea, vomiting, constipation, or diarrhea. She has no urinary complaints. Patient otherwise feels well and offers no further specific complaints.  REVIEW OF SYSTEMS:   Review of Systems  Constitutional: Negative.  Negative for fever, malaise/fatigue and weight loss.  HENT: Positive for ear pain, hearing loss and sore throat.   Respiratory: Negative.  Negative for cough and shortness of breath.   Cardiovascular: Negative.  Negative for chest pain and leg swelling.  Gastrointestinal: Negative.  Negative for abdominal pain.  Genitourinary: Negative.   Musculoskeletal: Negative.   Neurological: Negative.  Negative for weakness.  Psychiatric/Behavioral: Negative.  The patient is not nervous/anxious.     As per HPI. Otherwise, a complete review of systems is negative.  PAST MEDICAL HISTORY: Past Medical History:  Diagnosis Date  . Arthritis   . Cancer ([place]) 03/13/2016   Malignant neoplasm of tonsil  . Hypertension   . Postmenopausal     PAST SURGICAL HISTORY: No past surgical history on file.  FAMILY HISTORY: Negative and noncontributory. No reported history of malignancy or chronic disease.  ADVANCED DIRECTIVES (Y/N):  N  HEALTH  MAINTENANCE: Social History  Substance Use Topics  . Smoking status: Never Smoker  . Smokeless tobacco: Never Used  . Alcohol use Not on file     Colonoscopy:  PAP:  Bone density:  Lipid panel:  No Known Allergies  Current Outpatient Prescriptions  Medication Sig Dispense Refill  . alendronate (FOSAMAX) 70 MG tablet Take 70 mg by mouth once a week. Take with a full glass of water on an empty stomach.    . ALPRAZolam (XANAX) 1 MG tablet Take 1 mg by mouth at bedtime as needed for anxiety.    Marland Kitchen amLODipine (NORVASC) 10 MG tablet Take 10 mg by mouth daily.    Marland Kitchen amoxicillin-clavulanate (AUGMENTIN) 875-125 MG tablet Take 1 tablet by mouth 2 (two) times daily. 14 tablet 0  . Cholecalciferol (VITAMIN D) 2000 units tablet Take 2,000 Units by mouth daily.    . clobetasol cream (TEMOVATE) [id number] % Apply 1 application topically 4 (four) times a week.    . co-enzyme Q-10 30 MG capsule Take 100 mg by mouth daily.    . finasteride (PROSCAR) 5 MG tablet Take 5 mg by mouth daily.    . hydrochlorothiazide (HYDRODIURIL) 25 MG tablet Take 25 mg by mouth daily.    Marland Kitchen lidocaine-prilocaine (EMLA) cream Apply to affected area once 30 g 3  . lisinopril (PRINIVIL,ZESTRIL) 40 MG tablet Take 40 mg by mouth daily.    . niacin (NIASPAN) 500 MG CR tablet Take 500 mg by mouth at bedtime.    . Omega-3 Fatty Acids (FISH OIL OMEGA-3 PO) Take 1 capsule by mouth 4 (four) times a week.    . ondansetron (ZOFRAN) 8 MG tablet Take 1 tablet (8  mg total) by mouth 2 (two) times daily as needed. 30 tablet 1  . oxyCODONE-acetaminophen (PERCOCET/ROXICET) 5-325 MG tablet   0  . Pioglitazone HCl (ACTOS PO) Take 7.5 mg by mouth daily.    . pravastatin (PRAVACHOL) 40 MG tablet Take 40 mg by mouth daily.    . prochlorperazine (COMPAZINE) 10 MG tablet Take 1 tablet (10 mg total) by mouth every 6 (six) hours as needed (Nausea or vomiting). 30 tablet 1   No current facility-administered medications for this visit.      OBJECTIVE: There were no vitals filed for this visit.   There is no height or weight on file to calculate BMI.    ECOG FS:0 - Asymptomatic  General: Well-developed, well-nourished, no acute distress. Eyes: Pink conjunctiva, anicteric sclera. HEENT: Normocephalic, moist mucous membranes, mass seen on left tonsillar area. No palpable neck lymphadenopathy. Fluid seen behind left tympanic membrane. Lungs: Clear to auscultation bilaterally. Heart: Regular rate and rhythm. No rubs, murmurs, or gallops. Abdomen: Soft, nontender, nondistended. No organomegaly noted, normoactive bowel sounds. Musculoskeletal: No edema, cyanosis, or clubbing. Neuro: Alert, answering all questions appropriately. Cranial nerves grossly intact. Skin: No rashes or petechiae noted. Psych: Normal affect. Lymphatics: No calvicular, axillary or inguinal LAD.   LAB RESULTS:  No results found for: NA, K, CL, CO2, GLUCOSE, BUN, CREATININE, CALCIUM, PROT, ALBUMIN, AST, ALT, ALKPHOS, BILITOT, GFRNONAA, GFRAA  No results found for: WBC, NEUTROABS, HGB, HCT, MCV, PLT   STUDIES: Nm Pet Image Initial (pi) Skull Base To Thigh  Result Date: 03/13/2016 CLINICAL DATA:  Initial treatment strategy for head neck carcinoma. Malignant neoplasm of the LEFT tonsil. EXAM: NUCLEAR MEDICINE PET SKULL BASE TO THIGH TECHNIQUE: 02/2019 mCi F-18 FDG was injected intravenously. Full-ring PET imaging was performed from the skull base to thigh after the radiotracer. CT data was obtained and used for attenuation correction and anatomic localization. FASTING BLOOD GLUCOSE:  Value: 103 mg/dl COMPARISON:  Neck CT 02/22/2016 FINDINGS: NECK Hypermetabolic mass centered in the LEFT tonsil measures 2.2 cm with SUV max equal 11. There is hypermetabolic thickening of the RIGHT tonsil measuring 1.0 cm with SUV max equal 5.8. There bilateral hypermetabolic level 2 lymph nodes on image 34 of the fused data set. The RIGHT lymph node measures 12 mm with SUV max  4.3. The LEFT level 2 lymph node measures 12 mm (image 33, series 4) with SUV max equal 3.6. There is a more superior LEFT level 2 new lymph node measuring 7 mm (image 25, series 4) with intense metabolic activity for size (SUV max equal 3.8. Incidental note of hypermetabolic brown fat in the posterior neck sub occipital region. CHEST Hypermetabolic brown fat within the supraclavicular and sub pectoralis muscles as well as along the spine (benign finding). No hypermetabolic axillary or mediastinal lymph nodes. No suspicious pulmonary nodules. ABDOMEN/PELVIS No abnormal hypermetabolic activity within the liver, pancreas, adrenal glands, or spleen. No hypermetabolic lymph nodes in the abdomen or pelvis. Uterus normal. SKELETON No focal hypermetabolic activity to suggest skeletal metastasis. IMPRESSION: 1. Hypermetabolic mass in the LEFT tonsil consists with primary carcinoma. 2. Hypermetabolic thickening in the RIGHT tonsillar region. Cannot exclude malignancy. Consider biopsy. 3. Bilateral level II hypermetabolic cervical lymph nodes. Two nodes on the LEFT and one on the RIGHT. 4. No evidence of distant metastatic nodal disease or soft tissue metastasis. Electronically Signed   By: Suzy Bouchard M.D.   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 PLAN:  1. 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 PET scan results reviewed independently and reported as above with possible disease in right tonsil as well as bilateral lymphadenopathy. Given the clinical stage of her disease, she will benefit from concurrent XRT and chemotherapy using weekly cisplatin. Would not recommend induction chemotherapy at this time. Patient also evaluated by radiation oncology today. Port placement was discussed, but patient declined at this time. Return to clinic on March 27, 2016 to initiate cycle 1 of weekly cisplatin.    Approximately 30 minutes was spent in discussion of which greater than 50% was consultation.  Patient expressed understanding and was in agreement with this plan. She also understands that She can call clinic at any time with any questions, concerns, or complaints.    Lloyd Huger, MD   03/25/2016 9:47 PM

## 2016-03-26 ENCOUNTER — Other Ambulatory Visit: Payer: BLUE CROSS/BLUE SHIELD

## 2016-03-27 ENCOUNTER — Inpatient Hospital Stay: Payer: BLUE CROSS/BLUE SHIELD

## 2016-03-27 ENCOUNTER — Other Ambulatory Visit: Payer: Self-pay | Admitting: Oncology

## 2016-03-27 ENCOUNTER — Inpatient Hospital Stay (HOSPITAL_BASED_OUTPATIENT_CLINIC_OR_DEPARTMENT_OTHER): Payer: BLUE CROSS/BLUE SHIELD | Admitting: Oncology

## 2016-03-27 ENCOUNTER — Inpatient Hospital Stay: Payer: BLUE CROSS/BLUE SHIELD | Admitting: Oncology

## 2016-03-27 VITALS — BP 109/71 | HR 69 | Temp 97.6°F | Resp 18 | Wt 148.1 lb

## 2016-03-27 DIAGNOSIS — M129 Arthropathy, unspecified: Secondary | ICD-10-CM

## 2016-03-27 DIAGNOSIS — I6523 Occlusion and stenosis of bilateral carotid arteries: Secondary | ICD-10-CM

## 2016-03-27 DIAGNOSIS — H9192 Unspecified hearing loss, left ear: Secondary | ICD-10-CM | POA: Diagnosis not present

## 2016-03-27 DIAGNOSIS — C099 Malignant neoplasm of tonsil, unspecified: Secondary | ICD-10-CM

## 2016-03-27 DIAGNOSIS — I10 Essential (primary) hypertension: Secondary | ICD-10-CM

## 2016-03-27 DIAGNOSIS — Z923 Personal history of irradiation: Secondary | ICD-10-CM

## 2016-03-27 DIAGNOSIS — Z79899 Other long term (current) drug therapy: Secondary | ICD-10-CM

## 2016-03-27 DIAGNOSIS — M47812 Spondylosis without myelopathy or radiculopathy, cervical region: Secondary | ICD-10-CM

## 2016-03-27 DIAGNOSIS — J029 Acute pharyngitis, unspecified: Secondary | ICD-10-CM | POA: Diagnosis not present

## 2016-03-27 DIAGNOSIS — H9202 Otalgia, left ear: Secondary | ICD-10-CM | POA: Diagnosis not present

## 2016-03-27 DIAGNOSIS — R590 Localized enlarged lymph nodes: Secondary | ICD-10-CM

## 2016-03-27 LAB — CBC WITH DIFFERENTIAL/PLATELET
BASOS ABS: 0 10*3/uL (ref 0–0.1)
BASOS PCT: 0 %
EOS ABS: 0.1 10*3/uL (ref 0–0.7)
EOS PCT: 1 %
HCT: 34.7 % — ABNORMAL LOW (ref 35.0–47.0)
Hemoglobin: 12.1 g/dL (ref 12.0–16.0)
Lymphocytes Relative: 14 %
Lymphs Abs: 1.1 10*3/uL (ref 1.0–3.6)
MCH: 32.2 pg (ref 26.0–34.0)
MCHC: 34.9 g/dL (ref 32.0–36.0)
MCV: 92.3 fL (ref 80.0–100.0)
MONO ABS: 0.7 10*3/uL (ref 0.2–0.9)
Monocytes Relative: 9 %
Neutro Abs: 5.9 10*3/uL (ref 1.4–6.5)
Neutrophils Relative %: 76 %
PLATELETS: 317 10*3/uL (ref 150–440)
RBC: 3.77 MIL/uL — ABNORMAL LOW (ref 3.80–5.20)
RDW: 12.1 % (ref 11.5–14.5)
WBC: 7.8 10*3/uL (ref 3.6–11.0)

## 2016-03-27 LAB — BASIC METABOLIC PANEL
Anion gap: 7 (ref 5–15)
BUN: 17 mg/dL (ref 6–20)
CALCIUM: 9 mg/dL (ref 8.9–10.3)
CO2: 26 mmol/L (ref 22–32)
Chloride: 99 mmol/L — ABNORMAL LOW (ref 101–111)
Creatinine, Ser: 0.6 mg/dL (ref 0.44–1.00)
GLUCOSE: 98 mg/dL (ref 65–99)
Potassium: 4 mmol/L (ref 3.5–5.1)
SODIUM: 132 mmol/L — AB (ref 135–145)

## 2016-03-27 MED ORDER — POTASSIUM CHLORIDE 2 MEQ/ML IV SOLN
Freq: Once | INTRAVENOUS | Status: AC
  Administered 2016-03-27: 11:00:00 via INTRAVENOUS
  Filled 2016-03-27: qty 1000

## 2016-03-27 MED ORDER — SODIUM CHLORIDE 0.9 % IV SOLN
Freq: Once | INTRAVENOUS | Status: DC
  Filled 2016-03-27: qty 5

## 2016-03-27 MED ORDER — SODIUM CHLORIDE 0.9 % IV SOLN: 40.0000 mg/m2 | Freq: Once | INTRAVENOUS | Status: DC

## 2016-03-27 MED ORDER — PALONOSETRON HCL INJECTION 0.25 MG/5ML: 0.2500 mg | Freq: Once | INTRAVENOUS | Status: DC

## 2016-03-27 MED ORDER — SODIUM CHLORIDE 0.9 % IV SOLN
Freq: Once | INTRAVENOUS | Status: AC
  Administered 2016-03-27: 13:00:00 via INTRAVENOUS
  Filled 2016-03-27: qty 5

## 2016-03-27 MED ORDER — SODIUM CHLORIDE 0.9 % IV SOLN
40.0000 mg/m2 | Freq: Once | INTRAVENOUS | Status: AC
  Administered 2016-03-27: 71 mg via INTRAVENOUS
  Filled 2016-03-27: qty 71

## 2016-03-27 MED ORDER — PALONOSETRON HCL INJECTION 0.25 MG/5ML
0.2500 mg | Freq: Once | INTRAVENOUS | Status: AC
  Administered 2016-03-27: 0.25 mg via INTRAVENOUS
  Filled 2016-03-27: qty 5

## 2016-03-27 MED ORDER — SODIUM CHLORIDE 0.9 % IV SOLN
Freq: Once | INTRAVENOUS | Status: AC
  Administered 2016-03-27: 11:00:00 via INTRAVENOUS
  Filled 2016-03-27: qty 1000

## 2016-03-27 NOTE — Progress Notes (Signed)
Patient is here today for follow up, she mentions pressure in her left ear, the pain went away but the pressure is worse. She has been taking OTC meds to help with her ear, she has been taking tylenol or advil.

## 2016-03-27 NOTE — Progress Notes (Signed)
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  INTERVAL HISTORY: Patient returns to clinic today for further evaluation and initiation of cycle 1 of weekly cisplatin along with concurrent XRT. She continues to have left neck tenderness, but states this is improved. She has decreased hearing, but no further drainage from her ear.  She has no neurologic complaints. She denies any recent fevers. She has a good appetite and denies weight loss. She denies any dysphasia. She has no chest pain or shortness of breath. She denies any nausea, vomiting, constipation, or diarrhea. She has no urinary complaints. Patient otherwise feels well and offers no further specific complaints.  REVIEW OF SYSTEMS:   Review of Systems  Constitutional: Negative.  Negative for fever, malaise/fatigue and weight loss.  HENT: Positive for ear pain, hearing loss and sore throat.   Respiratory: Negative.  Negative for cough and shortness of breath.   Cardiovascular: Negative.  Negative for chest pain and leg swelling.  Gastrointestinal: Negative.  Negative for abdominal pain.  Genitourinary: Negative.   Musculoskeletal: Negative.   Neurological: Negative.  Negative for weakness.  Psychiatric/Behavioral: Negative.  The patient is not nervous/anxious.     As per HPI. Otherwise, a complete review of systems is negative.  PAST MEDICAL HISTORY: Past Medical History:  Diagnosis Date  . Arthritis   . Cancer ([place]) 03/13/2016   Malignant neoplasm of tonsil  . Hypertension   . Postmenopausal     PAST SURGICAL HISTORY: No past surgical history on file.  FAMILY HISTORY: Negative and noncontributory. No reported history of malignancy or chronic disease.  ADVANCED  DIRECTIVES (Y/N):  N  HEALTH MAINTENANCE: Social History  Substance Use Topics  . Smoking status: Never Smoker  . Smokeless tobacco: Never Used  . Alcohol use Not on file     Colonoscopy:  PAP:  Bone density:  Lipid panel:  No Known Allergies  Current Outpatient Prescriptions  Medication Sig Dispense Refill  . alendronate (FOSAMAX) 70 MG tablet Take 70 mg by mouth once a week. Take with a full glass of water on an empty stomach.    . ALPRAZolam (XANAX) 1 MG tablet Take 1 mg by mouth at bedtime as needed for anxiety.    Marland Kitchen amLODipine (NORVASC) 10 MG tablet Take 10 mg by mouth daily.    Marland Kitchen amoxicillin-clavulanate (AUGMENTIN) 875-125 MG tablet Take 1 tablet by mouth 2 (two) times daily. 14 tablet 0  . Cholecalciferol (VITAMIN D) 2000 units tablet Take 2,000 Units by mouth daily.    . clobetasol cream (TEMOVATE) AB-123456789 % Apply 1 application topically 4 (four) times a week.    . co-enzyme Q-10 30 MG capsule Take 100 mg by mouth daily.    . finasteride (PROSCAR) 5 MG tablet Take 5 mg by mouth daily.    . hydrochlorothiazide (HYDRODIURIL) 25 MG tablet Take 25 mg by mouth daily.    Marland Kitchen lidocaine-prilocaine (EMLA) cream Apply to affected area once 30 g 3  . lisinopril (PRINIVIL,ZESTRIL) 40 MG tablet Take 40 mg by mouth daily.    . niacin (NIASPAN) 500 MG CR tablet Take 500 mg by mouth at bedtime.    . Omega-3 Fatty Acids (FISH OIL OMEGA-3 PO) Take 1 capsule by mouth 4 (four) times a week.    . ondansetron (ZOFRAN)  8 MG tablet Take 1 tablet (8 mg total) by mouth 2 (two) times daily as needed. 30 tablet 1  . oxyCODONE-acetaminophen (PERCOCET/ROXICET) 5-325 MG tablet   0  . Pioglitazone HCl (ACTOS PO) Take 7.5 mg by mouth daily.    . pravastatin (PRAVACHOL) 40 MG tablet Take 40 mg by mouth daily.    . prochlorperazine (COMPAZINE) 10 MG tablet Take 1 tablet (10 mg total) by mouth every 6 (six) hours as needed (Nausea or vomiting). 30 tablet 1   No current facility-administered medications for this  visit.     OBJECTIVE: Vitals:   03/27/16 1012  BP: 109/71  Pulse: 69  Resp: 18  Temp: 97.6 F (36.4 C)     Body mass index is 24.65 kg/m.    ECOG FS:0 - Asymptomatic  General: Well-developed, well-nourished, no acute distress. Eyes: Pink conjunctiva, anicteric sclera. HEENT: Normocephalic, moist mucous membranes, mass seen on left tonsillar area. No palpable neck lymphadenopathy.  Lungs: Clear to auscultation bilaterally. Heart: Regular rate and rhythm. No rubs, murmurs, or gallops. Abdomen: Soft, nontender, nondistended. No organomegaly noted, normoactive bowel sounds. Musculoskeletal: No edema, cyanosis, or clubbing. Neuro: Alert, answering all questions appropriately. Cranial nerves grossly intact. Skin: No rashes or petechiae noted. Psych: Normal affect. Lymphatics: No calvicular, axillary or inguinal LAD.   LAB RESULTS:  Lab Results  Component Value Date   NA 132 (L) 03/27/2016   K 4.0 03/27/2016   CL 99 (L) 03/27/2016   CO2 26 03/27/2016   GLUCOSE 98 03/27/2016   BUN 17 03/27/2016   CREATININE 0.60 03/27/2016   CALCIUM 9.0 03/27/2016   GFRNONAA >60 03/27/2016   GFRAA >60 03/27/2016    Lab Results  Component Value Date   WBC 7.8 03/27/2016   NEUTROABS 5.9 03/27/2016   HGB 12.1 03/27/2016   HCT 34.7 (L) 03/27/2016   MCV 92.3 03/27/2016   PLT 317 03/27/2016     STUDIES: Nm Pet Image Initial (pi) Skull Base To Thigh  Result Date: 03/13/2016 CLINICAL DATA:  Initial treatment strategy for head neck carcinoma. Malignant neoplasm of the LEFT tonsil. EXAM: NUCLEAR MEDICINE PET SKULL BASE TO THIGH TECHNIQUE: 02/2019 mCi F-18 FDG was injected intravenously. Full-ring PET imaging was performed from the skull base to thigh after the radiotracer. CT data was obtained and used for attenuation correction and anatomic localization. FASTING BLOOD GLUCOSE:  Value: 103 mg/dl COMPARISON:  Neck CT 02/22/2016 FINDINGS: NECK Hypermetabolic mass centered in the LEFT tonsil  measures 2.2 cm with SUV max equal 11. There is hypermetabolic thickening of the RIGHT tonsil measuring 1.0 cm with SUV max equal 5.8. There bilateral hypermetabolic level 2 lymph nodes on image 34 of the fused data set. The RIGHT lymph node measures 12 mm with SUV max 4.3. The LEFT level 2 lymph node measures 12 mm (image 33, series 4) with SUV max equal 3.6. There is a more superior LEFT level 2 new lymph node measuring 7 mm (image 25, series 4) with intense metabolic activity for size (SUV max equal 3.8. Incidental note of hypermetabolic brown fat in the posterior neck sub occipital region. CHEST Hypermetabolic brown fat within the supraclavicular and sub pectoralis muscles as well as along the spine (benign finding). No hypermetabolic axillary or mediastinal lymph nodes. No suspicious pulmonary nodules. ABDOMEN/PELVIS No abnormal hypermetabolic activity within the liver, pancreas, adrenal glands, or spleen. No hypermetabolic lymph nodes in the abdomen or pelvis. Uterus normal. SKELETON No focal hypermetabolic activity to suggest skeletal metastasis. IMPRESSION: 1. Hypermetabolic mass  in the LEFT tonsil consists with primary carcinoma. 2. Hypermetabolic thickening in the RIGHT tonsillar region. Cannot exclude malignancy. Consider biopsy. 3. Bilateral level II hypermetabolic cervical lymph nodes. Two nodes on the LEFT and one on the RIGHT. 4. No evidence of distant metastatic nodal disease or soft tissue metastasis. Electronically Signed   By: Suzy Bouchard M.D.   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 PLAN:    1. 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 PET scan results reviewed independently and reported as above with possible disease in right tonsil as well as bilateral lymphadenopathy. Given the clinical stage of her disease, she will benefit from concurrent XRT and chemotherapy using weekly  cisplatin. Port placement was discussed, but patient declined at this time. Proceed with cycle 1 of weekly cisplatin. Patient will initiate XRT in the next several days. Return to clinic in 1 week for further evaluation and consideration of cycle 2. Return to clinic on March 27, 2016 to initiate cycle 1 of weekly cisplatin.  2. Hearing loss: Possibly secondary to mass effect of malignancy, monitor. 3. Pain: Continue current treatment as prescribed.   Patient expressed understanding and was in agreement with this plan. She also understands that She can call clinic at any time with any questions, concerns, or complaints.    Lloyd Huger, MD   03/27/2016 10:41 AM

## 2016-03-28 ENCOUNTER — Ambulatory Visit: Payer: BLUE CROSS/BLUE SHIELD | Admitting: Oncology

## 2016-03-28 ENCOUNTER — Other Ambulatory Visit: Payer: BLUE CROSS/BLUE SHIELD

## 2016-03-28 ENCOUNTER — Ambulatory Visit: Payer: BLUE CROSS/BLUE SHIELD

## 2016-04-01 ENCOUNTER — Ambulatory Visit: Payer: BLUE CROSS/BLUE SHIELD

## 2016-04-01 ENCOUNTER — Ambulatory Visit: Payer: BLUE CROSS/BLUE SHIELD | Admitting: Oncology

## 2016-04-01 ENCOUNTER — Other Ambulatory Visit: Payer: BLUE CROSS/BLUE SHIELD

## 2016-04-01 NOTE — Progress Notes (Signed)
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  INTERVAL HISTORY: Patient returns to clinic today for further evaluation and consideration of cycle 2 of weekly cisplatin. She tolerated her first treatment well with only some mild nausea and constipation. She continues to have left neck tenderness. She has no neurologic complaints. She denies any recent fevers. She has a good appetite and denies weight loss. She denies any dysphasia. She has no chest pain or shortness of breath. She denies any nausea, vomiting, constipation, or diarrhea. She has no urinary complaints. Patient otherwise feels well and offers no further specific complaints.  REVIEW OF SYSTEMS:   Review of Systems  Constitutional: Negative.  Negative for fever, malaise/fatigue and weight loss.  HENT: Positive for sore throat. Negative for ear pain and hearing loss.   Respiratory: Negative.  Negative for cough and shortness of breath.   Cardiovascular: Negative.  Negative for chest pain and leg swelling.  Gastrointestinal: Positive for constipation and nausea. Negative for abdominal pain.  Genitourinary: Negative.   Musculoskeletal: Negative.   Neurological: Negative.  Negative for weakness.  Psychiatric/Behavioral: Negative.  The patient is not nervous/anxious.     As per HPI. Otherwise, a complete review of systems is negative.  PAST MEDICAL HISTORY: Past Medical History:  Diagnosis Date  . Arthritis   . Cancer ([place]) 03/13/2016   Malignant neoplasm of tonsil  . Hypertension   . Postmenopausal     PAST SURGICAL HISTORY: No past surgical history on file.  FAMILY HISTORY: Negative and noncontributory. No reported history of malignancy or chronic  disease.  ADVANCED DIRECTIVES (Y/N):  N  HEALTH MAINTENANCE: Social History  Substance Use Topics  . Smoking status: Never Smoker  . Smokeless tobacco: Never Used  . Alcohol use Not on file     Colonoscopy:  PAP:  Bone density:  Lipid panel:  No Known Allergies  Current Outpatient Prescriptions  Medication Sig Dispense Refill  . alendronate (FOSAMAX) 70 MG tablet Take 70 mg by mouth once a week. Take with a full glass of water on an empty stomach.    . ALPRAZolam (XANAX) 1 MG tablet Take 1 mg by mouth at bedtime as needed for anxiety.    Marland Kitchen amLODipine (NORVASC) 10 MG tablet Take 10 mg by mouth daily.    . Cholecalciferol (VITAMIN D) 2000 units tablet Take 2,000 Units by mouth daily.    . clobetasol cream (TEMOVATE) AB-123456789 % Apply 1 application topically 4 (four) times a week.    . co-enzyme Q-10 30 MG capsule Take 100 mg by mouth daily.    . finasteride (PROSCAR) 5 MG tablet Take 5 mg by mouth daily.    . hydrochlorothiazide (HYDRODIURIL) 25 MG tablet Take 25 mg by mouth daily.    Marland Kitchen lidocaine-prilocaine (EMLA) cream Apply to affected area once 30 g 3  . lisinopril (PRINIVIL,ZESTRIL) 40 MG tablet Take 40 mg by mouth daily.    . niacin (NIASPAN) 500 MG CR tablet Take 500 mg by mouth at bedtime.    . Omega-3 Fatty Acids (FISH OIL OMEGA-3 PO) Take 1 capsule by mouth 4 (four) times a week.    . ondansetron (ZOFRAN) 8 MG tablet Take 1 tablet (8 mg total) by mouth 2 (two) times daily as needed. 30 tablet 1  .  oxyCODONE-acetaminophen (PERCOCET/ROXICET) 5-325 MG tablet   0  . Pioglitazone HCl (ACTOS PO) Take 7.5 mg by mouth daily.    . pravastatin (PRAVACHOL) 40 MG tablet Take 40 mg by mouth daily.    . prochlorperazine (COMPAZINE) 10 MG tablet Take 1 tablet (10 mg total) by mouth every 6 (six) hours as needed (Nausea or vomiting). 30 tablet 1   No current facility-administered medications for this visit.     OBJECTIVE: Vitals:   04/02/16 0857  BP: 123/75  Pulse: 73  Resp: 18   Temp: 98.9 F (37.2 C)     Body mass index is 24.73 kg/m.    ECOG FS:0 - Asymptomatic  General: Well-developed, well-nourished, no acute distress. Eyes: Pink conjunctiva, anicteric sclera. HEENT: Normocephalic, moist mucous membranes, mass seen on left tonsillar area. No palpable neck lymphadenopathy. Fluid seen behind left tympanic membrane. Lungs: Clear to auscultation bilaterally. Heart: Regular rate and rhythm. No rubs, murmurs, or gallops. Abdomen: Soft, nontender, nondistended. No organomegaly noted, normoactive bowel sounds. Musculoskeletal: No edema, cyanosis, or clubbing. Neuro: Alert, answering all questions appropriately. Cranial nerves grossly intact. Skin: No rashes or petechiae noted. Psych: Normal affect. Lymphatics: No calvicular, axillary or inguinal LAD.   LAB RESULTS:  Lab Results  Component Value Date   NA 130 (L) 04/02/2016   K 4.0 04/02/2016   CL 97 (L) 04/02/2016   CO2 25 04/02/2016   GLUCOSE 94 04/02/2016   BUN 18 04/02/2016   CREATININE 0.57 04/02/2016   CALCIUM 8.7 (L) 04/02/2016   GFRNONAA >60 04/02/2016   GFRAA >60 04/02/2016    Lab Results  Component Value Date   WBC 7.7 04/02/2016   NEUTROABS 4.9 04/02/2016   HGB 11.6 (L) 04/02/2016   HCT 33.1 (L) 04/02/2016   MCV 91.8 04/02/2016   PLT 264 04/02/2016     STUDIES: Nm Pet Image Initial (pi) Skull Base To Thigh  Result Date: 03/13/2016 CLINICAL DATA:  Initial treatment strategy for head neck carcinoma. Malignant neoplasm of the LEFT tonsil. EXAM: NUCLEAR MEDICINE PET SKULL BASE TO THIGH TECHNIQUE: 02/2019 mCi F-18 FDG was injected intravenously. Full-ring PET imaging was performed from the skull base to thigh after the radiotracer. CT data was obtained and used for attenuation correction and anatomic localization. FASTING BLOOD GLUCOSE:  Value: 103 mg/dl COMPARISON:  Neck CT 02/22/2016 FINDINGS: NECK Hypermetabolic mass centered in the LEFT tonsil measures 2.2 cm with SUV max equal 11. There  is hypermetabolic thickening of the RIGHT tonsil measuring 1.0 cm with SUV max equal 5.8. There bilateral hypermetabolic level 2 lymph nodes on image 34 of the fused data set. The RIGHT lymph node measures 12 mm with SUV max 4.3. The LEFT level 2 lymph node measures 12 mm (image 33, series 4) with SUV max equal 3.6. There is a more superior LEFT level 2 new lymph node measuring 7 mm (image 25, series 4) with intense metabolic activity for size (SUV max equal 3.8. Incidental note of hypermetabolic brown fat in the posterior neck sub occipital region. CHEST Hypermetabolic brown fat within the supraclavicular and sub pectoralis muscles as well as along the spine (benign finding). No hypermetabolic axillary or mediastinal lymph nodes. No suspicious pulmonary nodules. ABDOMEN/PELVIS No abnormal hypermetabolic activity within the liver, pancreas, adrenal glands, or spleen. No hypermetabolic lymph nodes in the abdomen or pelvis. Uterus normal. SKELETON No focal hypermetabolic activity to suggest skeletal metastasis. IMPRESSION: 1. Hypermetabolic mass in the LEFT tonsil consists with primary carcinoma. 2. Hypermetabolic thickening in the RIGHT tonsillar  region. Cannot exclude malignancy. Consider biopsy. 3. Bilateral level II hypermetabolic cervical lymph nodes. Two nodes on the LEFT and one on the RIGHT. 4. No evidence of distant metastatic nodal disease or soft tissue metastasis. Electronically Signed   By: Suzy Bouchard M.D.   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 PLAN:    1. 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 PET scan results reviewed independently and reported as above. Given the clinical stage of her disease, she will benefit from concurrent XRT and chemotherapy using weekly cisplatin. Would not recommend induction chemotherapy at this time. Proceed with cycle 2 of weekly cisplatin today.  Patient is also initiating XRT today. Return to clinic in 1 week for further evaluation and consideration of cycle 3. 2. Ear pain: Likely secondary to malignancy. Continued symptomatic treatment as prescribed. 3. Constipation: Continue OTC treatments as needed.  4. Nausea: Mild, monitor.  Patient expressed understanding and was in agreement with this plan. She also understands that She can call clinic at any time with any questions, concerns, or complaints.    Lloyd Huger, MD   04/02/2016 9:16 AM

## 2016-04-02 ENCOUNTER — Inpatient Hospital Stay: Payer: BLUE CROSS/BLUE SHIELD

## 2016-04-02 ENCOUNTER — Ambulatory Visit: Payer: BLUE CROSS/BLUE SHIELD

## 2016-04-02 ENCOUNTER — Inpatient Hospital Stay (HOSPITAL_BASED_OUTPATIENT_CLINIC_OR_DEPARTMENT_OTHER): Payer: BLUE CROSS/BLUE SHIELD | Admitting: Oncology

## 2016-04-02 VITALS — BP 123/75 | HR 73 | Temp 98.9°F | Resp 18 | Wt 148.6 lb

## 2016-04-02 DIAGNOSIS — I10 Essential (primary) hypertension: Secondary | ICD-10-CM

## 2016-04-02 DIAGNOSIS — Z79899 Other long term (current) drug therapy: Secondary | ICD-10-CM

## 2016-04-02 DIAGNOSIS — M129 Arthropathy, unspecified: Secondary | ICD-10-CM

## 2016-04-02 DIAGNOSIS — K5900 Constipation, unspecified: Secondary | ICD-10-CM | POA: Diagnosis not present

## 2016-04-02 DIAGNOSIS — H9192 Unspecified hearing loss, left ear: Secondary | ICD-10-CM | POA: Diagnosis not present

## 2016-04-02 DIAGNOSIS — R590 Localized enlarged lymph nodes: Secondary | ICD-10-CM

## 2016-04-02 DIAGNOSIS — I6523 Occlusion and stenosis of bilateral carotid arteries: Secondary | ICD-10-CM

## 2016-04-02 DIAGNOSIS — Z923 Personal history of irradiation: Secondary | ICD-10-CM

## 2016-04-02 DIAGNOSIS — C090 Malignant neoplasm of tonsillar fossa: Secondary | ICD-10-CM | POA: Diagnosis not present

## 2016-04-02 DIAGNOSIS — J029 Acute pharyngitis, unspecified: Secondary | ICD-10-CM

## 2016-04-02 DIAGNOSIS — H9202 Otalgia, left ear: Secondary | ICD-10-CM | POA: Diagnosis not present

## 2016-04-02 DIAGNOSIS — M47812 Spondylosis without myelopathy or radiculopathy, cervical region: Secondary | ICD-10-CM

## 2016-04-02 DIAGNOSIS — R110 Nausea: Secondary | ICD-10-CM

## 2016-04-02 DIAGNOSIS — C099 Malignant neoplasm of tonsil, unspecified: Secondary | ICD-10-CM

## 2016-04-02 LAB — BASIC METABOLIC PANEL
ANION GAP: 8 (ref 5–15)
BUN: 18 mg/dL (ref 6–20)
CHLORIDE: 97 mmol/L — AB (ref 101–111)
CO2: 25 mmol/L (ref 22–32)
Calcium: 8.7 mg/dL — ABNORMAL LOW (ref 8.9–10.3)
Creatinine, Ser: 0.57 mg/dL (ref 0.44–1.00)
GFR calc Af Amer: >60 mL/min (ref >60)
GFR calc non Af Amer: >60 mL/min (ref >60)
GLUCOSE: 94 mg/dL (ref 65–99)
POTASSIUM: 4 mmol/L (ref 3.5–5.1)
Sodium: 130 mmol/L — ABNORMAL LOW (ref 135–145)

## 2016-04-02 LAB — CBC WITH DIFFERENTIAL/PLATELET
Basophils Absolute: 0 10*3/uL (ref 0–0.1)
Basophils Relative: 1 %
Eosinophils Absolute: 0.1 10*3/uL (ref 0–0.7)
Eosinophils Relative: 1 %
HEMATOCRIT: 33.1 % — AB (ref 35.0–47.0)
HEMOGLOBIN: 11.6 g/dL — AB (ref 12.0–16.0)
LYMPHS PCT: 21 %
Lymphs Abs: 1.6 10*3/uL (ref 1.0–3.6)
MCH: 32.1 pg (ref 26.0–34.0)
MCHC: 35 g/dL (ref 32.0–36.0)
MCV: 91.8 fL (ref 80.0–100.0)
MONO ABS: 1.1 10*3/uL — AB (ref 0.2–0.9)
Monocytes Relative: 14 %
NEUTROS ABS: 4.9 10*3/uL (ref 1.4–6.5)
Neutrophils Relative %: 63 %
Platelets: 264 10*3/uL (ref 150–440)
RBC: 3.6 MIL/uL — ABNORMAL LOW (ref 3.80–5.20)
RDW: 11.8 % (ref 11.5–14.5)
WBC: 7.7 10*3/uL (ref 3.6–11.0)

## 2016-04-02 MED ORDER — POTASSIUM CHLORIDE 2 MEQ/ML IV SOLN
Freq: Once | INTRAVENOUS | Status: AC
  Administered 2016-04-02: 10:00:00 via INTRAVENOUS
  Filled 2016-04-02: qty 1000

## 2016-04-02 MED ORDER — SODIUM CHLORIDE 0.9 % IV SOLN
40.0000 mg/m2 | Freq: Once | INTRAVENOUS | Status: AC
  Administered 2016-04-02: 71 mg via INTRAVENOUS
  Filled 2016-04-02: qty 71

## 2016-04-02 MED ORDER — SODIUM CHLORIDE 0.9 % IV SOLN
Freq: Once | INTRAVENOUS | Status: AC
  Administered 2016-04-02: 12:00:00 via INTRAVENOUS
  Filled 2016-04-02: qty 5

## 2016-04-02 MED ORDER — SODIUM CHLORIDE 0.9 % IV SOLN
Freq: Once | INTRAVENOUS | Status: AC
  Administered 2016-04-02: 10:00:00 via INTRAVENOUS
  Filled 2016-04-02: qty 1000

## 2016-04-02 MED ORDER — PALONOSETRON HCL INJECTION 0.25 MG/5ML
0.2500 mg | Freq: Once | INTRAVENOUS | Status: AC
  Administered 2016-04-02: 0.25 mg via INTRAVENOUS
  Filled 2016-04-02: qty 5

## 2016-04-02 NOTE — Progress Notes (Signed)
Patient is here today for follow up, she says her stomach is feeling a little uneasy.

## 2016-04-03 ENCOUNTER — Ambulatory Visit
Admission: RE | Admit: 2016-04-03 | Discharge: 2016-04-03 | Disposition: A | Discharge status: Home / Self Care | Payer: BLUE CROSS/BLUE SHIELD | Source: Ambulatory Visit | Attending: Radiation Oncology | Admitting: Radiation Oncology

## 2016-04-03 DIAGNOSIS — C090 Malignant neoplasm of tonsillar fossa: Secondary | ICD-10-CM | POA: Diagnosis not present

## 2016-04-04 ENCOUNTER — Ambulatory Visit: Payer: BLUE CROSS/BLUE SHIELD

## 2016-04-04 DIAGNOSIS — C090 Malignant neoplasm of tonsillar fossa: Secondary | ICD-10-CM | POA: Diagnosis not present

## 2016-04-05 DIAGNOSIS — C090 Malignant neoplasm of tonsillar fossa: Secondary | ICD-10-CM | POA: Diagnosis not present

## 2016-04-08 DIAGNOSIS — C090 Malignant neoplasm of tonsillar fossa: Secondary | ICD-10-CM | POA: Diagnosis not present

## 2016-04-08 NOTE — Progress Notes (Signed)
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  INTERVAL HISTORY: Patient returns to clinic today for further evaluation and consideration of cycle 3 of weekly cisplatin. She is tolerating treatment relatively well.  She complains of fatigue at the end of the day, and headache pain after radiation therapy treatments.  She reports mild nausea, described as an "unsettled stomach", effectively relieved with Zofran daily, Compazine approximately every other day.  Patient also using baking soda and water to decrease stomach acid. Constipation has resolved with 4-5 prunes at night, along with either milk of magnesia, or senna and colace.  She continues to have left neck tenderness.  Her left ear pain and recent infection have resolved, and the pressure and her hearing in that ear is improving. She states she hears "things moving around in there" and attributes that to tumor shrinkage.  She has no neurologic complaints. She denies any recent fevers. Her appetite remains good, although food tastes bitter, she denies weight loss.  She denies any dysphasia or mouth sores. She has no chest pain or shortness of breath. She denies any vomiting or diarrhea. She has no urinary complaints. She denies hematuria or hematochezia.  She reports occasional insomnia, difficulty getting back to sleep.  She is anxious/tearful when talking about XRT treatments, as they cause pain in her head and neck.  Patient otherwise feels well and offers no further specific complaints.  REVIEW OF SYSTEMS:   Review of Systems  Constitutional: Negative.  Negative for fever, malaise/fatigue and weight loss.  HENT: Positive for hearing loss. Negative for ear pain and sore throat.   Eyes:  Positive for redness.  Respiratory: Negative.  Negative for cough and shortness of breath.   Cardiovascular: Negative.  Negative for chest pain and leg swelling.  Gastrointestinal: Positive for constipation and nausea. Negative for abdominal pain.  Genitourinary: Negative.   Musculoskeletal: Negative.   Neurological: Positive for headaches. Negative for tingling, sensory change and weakness.  Psychiatric/Behavioral: The patient is nervous/anxious and has insomnia.     As per HPI. Otherwise, a complete review of systems is negative.  PAST MEDICAL HISTORY: Past Medical History:  Diagnosis Date  . Arthritis   . Cancer ([place]) 03/13/2016   Malignant neoplasm of tonsil  . Hypertension   . Postmenopausal     PAST SURGICAL HISTORY: No past surgical history on file.  FAMILY HISTORY: Negative and noncontributory. No reported history of malignancy or chronic disease.  ADVANCED DIRECTIVES (Y/N):  N  HEALTH MAINTENANCE: Social History  Substance Use Topics  . Smoking status: Never Smoker  . Smokeless tobacco: Never Used  . Alcohol use Not on file     Colonoscopy:  PAP:  Bone density:  Lipid panel:  No Known Allergies  Current Outpatient Prescriptions  Medication Sig Dispense Refill  . alendronate (FOSAMAX) 70 MG tablet Take 70 mg by mouth once a week. Take with a full glass of water on an empty stomach.    Marland Kitchen amLODipine (NORVASC) 10 MG tablet Take 10 mg by mouth daily.    . Cholecalciferol (VITAMIN D) 2000 units tablet Take 2,000 Units by mouth daily.    . clobetasol cream (TEMOVATE) AB-123456789 % Apply 1 application topically 4 (four) times a week.    . co-enzyme Q-10 30 MG capsule Take 100  mg by mouth daily.    . finasteride (PROSCAR) 5 MG tablet Take 5 mg by mouth daily.    . hydrochlorothiazide (HYDRODIURIL) 25 MG tablet Take 25 mg by mouth daily.    Marland Kitchen lidocaine-prilocaine (EMLA) cream Apply to affected area once 30 g 3  . lisinopril (PRINIVIL,ZESTRIL) 40 MG tablet Take 40 mg by  mouth daily.    . niacin (NIASPAN) 500 MG CR tablet Take 500 mg by mouth at bedtime.    . ondansetron (ZOFRAN) 8 MG tablet Take 1 tablet (8 mg total) by mouth 2 (two) times daily as needed. 30 tablet 1  . Pioglitazone HCl (ACTOS PO) Take 7.5 mg by mouth daily.    . pravastatin (PRAVACHOL) 40 MG tablet Take 40 mg by mouth daily.    . prochlorperazine (COMPAZINE) 10 MG tablet Take 1 tablet (10 mg total) by mouth every 6 (six) hours as needed (Nausea or vomiting). 30 tablet 1  . ALPRAZolam (XANAX) 1 MG tablet Take 1 mg by mouth at bedtime as needed for anxiety.    . Omega-3 Fatty Acids (FISH OIL OMEGA-3 PO) Take 1 capsule by mouth 4 (four) times a week.    Marland Kitchen oxyCODONE-acetaminophen (PERCOCET/ROXICET) 5-325 MG tablet   0   No current facility-administered medications for this visit.     OBJECTIVE: Vitals:   04/09/16 0900  BP: 137/80  Pulse: 74  Resp: 18  Temp: 99.2 F (37.3 C)     Body mass index is 24.51 kg/m.    ECOG FS:0 - Asymptomatic  General: Well-developed, well-nourished, no acute distress. Eyes: Pink conjunctiva; reddened, anicteric sclera. HEENT: Normocephalic, moist mucous membranes, mass seen on left tonsillar area. No palpable neck lymphadenopathy.  Lungs: Clear to auscultation bilaterally. Heart: Regular rate and rhythm. No rubs, murmurs, or gallops. Abdomen: Soft, nontender, nondistended. No organomegaly noted, normoactive bowel sounds. Musculoskeletal: No edema, cyanosis, or clubbing. Neuro: Alert, answering all questions appropriately. Cranial nerves grossly intact. Skin: No rashes or petechiae noted. Psych: Normal affect. Lymphatics: No calvicular, axillary or inguinal LAD.   LAB RESULTS:  Lab Results  Component Value Date   NA 130 (L) 04/09/2016   K 4.0 04/09/2016   CL 96 (L) 04/09/2016   CO2 26 04/09/2016   GLUCOSE 91 04/09/2016   BUN 20 04/09/2016   CREATININE 0.70 04/09/2016   CALCIUM 8.8 (L) 04/09/2016   GFRNONAA >60 04/09/2016   GFRAA >60  04/09/2016    Lab Results  Component Value Date   WBC 6.9 04/09/2016   NEUTROABS 5.1 04/09/2016   HGB 11.4 (L) 04/09/2016   HCT 32.7 (L) 04/09/2016   MCV 93.1 04/09/2016   PLT 187 04/09/2016     STUDIES: Nm Pet Image Initial (pi) Skull Base To Thigh  Result Date: 03/13/2016 CLINICAL DATA:  Initial treatment strategy for head neck carcinoma. Malignant neoplasm of the LEFT tonsil. EXAM: NUCLEAR MEDICINE PET SKULL BASE TO THIGH TECHNIQUE: 02/2019 mCi F-18 FDG was injected intravenously. Full-ring PET imaging was performed from the skull base to thigh after the radiotracer. CT data was obtained and used for attenuation correction and anatomic localization. FASTING BLOOD GLUCOSE:  Value: 103 mg/dl COMPARISON:  Neck CT 02/22/2016 FINDINGS: NECK Hypermetabolic mass centered in the LEFT tonsil measures 2.2 cm with SUV max equal 11. There is hypermetabolic thickening of the RIGHT tonsil measuring 1.0 cm with SUV max equal 5.8. There bilateral hypermetabolic level 2 lymph nodes on image 34 of the fused data set. The RIGHT lymph node measures 12 mm with  SUV max 4.3. The LEFT level 2 lymph node measures 12 mm (image 33, series 4) with SUV max equal 3.6. There is a more superior LEFT level 2 new lymph node measuring 7 mm (image 25, series 4) with intense metabolic activity for size (SUV max equal 3.8. Incidental note of hypermetabolic brown fat in the posterior neck sub occipital region. CHEST Hypermetabolic brown fat within the supraclavicular and sub pectoralis muscles as well as along the spine (benign finding). No hypermetabolic axillary or mediastinal lymph nodes. No suspicious pulmonary nodules. ABDOMEN/PELVIS No abnormal hypermetabolic activity within the liver, pancreas, adrenal glands, or spleen. No hypermetabolic lymph nodes in the abdomen or pelvis. Uterus normal. SKELETON No focal hypermetabolic activity to suggest skeletal metastasis. IMPRESSION: 1. Hypermetabolic mass in the LEFT tonsil consists  with primary carcinoma. 2. Hypermetabolic thickening in the RIGHT tonsillar region. Cannot exclude malignancy. Consider biopsy. 3. Bilateral level II hypermetabolic cervical lymph nodes. Two nodes on the LEFT and one on the RIGHT. 4. No evidence of distant metastatic nodal disease or soft tissue metastasis. Electronically Signed   By: Suzy Bouchard M.D.   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 PLAN:    1. 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 PET scan results reviewed independently and reported as above. Given the clinical stage of her disease, she will benefit from concurrent XRT and chemotherapy using weekly cisplatin. Would not recommend induction chemotherapy at this time. Proceed with cycle 3 of weekly cisplatin today. Return to clinic in 1 week for further evaluation and consideration of cycle 4. 2. Ear pain: Resolved.  Likely secondary to malignancy.  3. Constipation: Continue OTC treatments as needed.  4. Nausea: Mild, continue prescribed medications as needed.  Monitor. 5. Insomnia/Anxiety: Discussed 4-7-8 breathing technique at bedtime: breathe in to count of 4, hold breath for count of 7, exhale for count of 8; do 3-5 times for letting go of overactive thoughts.  Provided breath ratio chart to patient for relaxing, balancing, and energizing breathing techniques.   Patient expressed understanding and was in agreement with this plan. She also understands that She can call clinic at any time with any questions, concerns, or complaints.   Lucendia Herrlich, NP  04/09/16 1:29 PM   Patient was seen and evaluated independently and I agree with the assessment and plan as dictated above.  Lloyd Huger, MD 04/12/16 8:46 AM

## 2016-04-09 ENCOUNTER — Inpatient Hospital Stay: Payer: BLUE CROSS/BLUE SHIELD

## 2016-04-09 ENCOUNTER — Inpatient Hospital Stay (HOSPITAL_BASED_OUTPATIENT_CLINIC_OR_DEPARTMENT_OTHER): Payer: BLUE CROSS/BLUE SHIELD | Admitting: Oncology

## 2016-04-09 ENCOUNTER — Ambulatory Visit
Admission: RE | Admit: 2016-04-09 | Discharge: 2016-04-09 | Disposition: A | Discharge status: Home / Self Care | Payer: BLUE CROSS/BLUE SHIELD | Source: Ambulatory Visit | Attending: Radiation Oncology | Admitting: Radiation Oncology

## 2016-04-09 VITALS — BP 137/80 | HR 74 | Temp 99.2°F | Resp 18 | Wt 147.3 lb

## 2016-04-09 DIAGNOSIS — C090 Malignant neoplasm of tonsillar fossa: Secondary | ICD-10-CM | POA: Diagnosis not present

## 2016-04-09 DIAGNOSIS — R51 Headache: Secondary | ICD-10-CM

## 2016-04-09 DIAGNOSIS — F419 Anxiety disorder, unspecified: Secondary | ICD-10-CM

## 2016-04-09 DIAGNOSIS — C099 Malignant neoplasm of tonsil, unspecified: Secondary | ICD-10-CM

## 2016-04-09 DIAGNOSIS — G4700 Insomnia, unspecified: Secondary | ICD-10-CM

## 2016-04-09 DIAGNOSIS — M129 Arthropathy, unspecified: Secondary | ICD-10-CM

## 2016-04-09 DIAGNOSIS — H9202 Otalgia, left ear: Secondary | ICD-10-CM | POA: Diagnosis not present

## 2016-04-09 DIAGNOSIS — I6523 Occlusion and stenosis of bilateral carotid arteries: Secondary | ICD-10-CM

## 2016-04-09 DIAGNOSIS — H9192 Unspecified hearing loss, left ear: Secondary | ICD-10-CM

## 2016-04-09 DIAGNOSIS — R110 Nausea: Secondary | ICD-10-CM

## 2016-04-09 DIAGNOSIS — J029 Acute pharyngitis, unspecified: Secondary | ICD-10-CM

## 2016-04-09 DIAGNOSIS — R590 Localized enlarged lymph nodes: Secondary | ICD-10-CM

## 2016-04-09 DIAGNOSIS — I10 Essential (primary) hypertension: Secondary | ICD-10-CM

## 2016-04-09 DIAGNOSIS — K5900 Constipation, unspecified: Secondary | ICD-10-CM

## 2016-04-09 DIAGNOSIS — Z923 Personal history of irradiation: Secondary | ICD-10-CM

## 2016-04-09 DIAGNOSIS — R5383 Other fatigue: Secondary | ICD-10-CM

## 2016-04-09 DIAGNOSIS — Z79899 Other long term (current) drug therapy: Secondary | ICD-10-CM

## 2016-04-09 DIAGNOSIS — M47812 Spondylosis without myelopathy or radiculopathy, cervical region: Secondary | ICD-10-CM

## 2016-04-09 LAB — CBC WITH DIFFERENTIAL/PLATELET
BASOS PCT: 0 %
Basophils Absolute: 0 10*3/uL (ref 0–0.1)
EOS ABS: 0.1 10*3/uL (ref 0–0.7)
Eosinophils Relative: 1 %
HEMATOCRIT: 32.7 % — AB (ref 35.0–47.0)
Hemoglobin: 11.4 g/dL — ABNORMAL LOW (ref 12.0–16.0)
LYMPHS ABS: 0.9 10*3/uL — AB (ref 1.0–3.6)
Lymphocytes Relative: 13 %
MCH: 32.3 pg (ref 26.0–34.0)
MCHC: 34.7 g/dL (ref 32.0–36.0)
MCV: 93.1 fL (ref 80.0–100.0)
MONO ABS: 0.8 10*3/uL (ref 0.2–0.9)
MONOS PCT: 12 %
NEUTROS ABS: 5.1 10*3/uL (ref 1.4–6.5)
Neutrophils Relative %: 74 %
Platelets: 187 10*3/uL (ref 150–440)
RBC: 3.51 MIL/uL — ABNORMAL LOW (ref 3.80–5.20)
RDW: 12.5 % (ref 11.5–14.5)
WBC: 6.9 10*3/uL (ref 3.6–11.0)

## 2016-04-09 LAB — BASIC METABOLIC PANEL
Anion gap: 8 (ref 5–15)
BUN: 20 mg/dL (ref 6–20)
CALCIUM: 8.8 mg/dL — AB (ref 8.9–10.3)
CO2: 26 mmol/L (ref 22–32)
CREATININE: 0.7 mg/dL (ref 0.44–1.00)
Chloride: 96 mmol/L — ABNORMAL LOW (ref 101–111)
GFR calc non Af Amer: >60 mL/min (ref >60)
Glucose, Bld: 91 mg/dL (ref 65–99)
Potassium: 4 mmol/L (ref 3.5–5.1)
Sodium: 130 mmol/L — ABNORMAL LOW (ref 135–145)

## 2016-04-09 MED ORDER — PALONOSETRON HCL INJECTION 0.25 MG/5ML
0.2500 mg | Freq: Once | INTRAVENOUS | Status: AC
  Administered 2016-04-09: 0.25 mg via INTRAVENOUS
  Filled 2016-04-09: qty 5

## 2016-04-09 MED ORDER — SODIUM CHLORIDE 0.9 % IV SOLN
Freq: Once | INTRAVENOUS | Status: AC
  Administered 2016-04-09: 13:00:00 via INTRAVENOUS
  Filled 2016-04-09: qty 5

## 2016-04-09 MED ORDER — SODIUM CHLORIDE 0.9 % IV SOLN
40.0000 mg/m2 | Freq: Once | INTRAVENOUS | Status: AC
  Administered 2016-04-09: 71 mg via INTRAVENOUS
  Filled 2016-04-09: qty 71

## 2016-04-09 MED ORDER — SODIUM CHLORIDE 0.9 % IV SOLN
Freq: Once | INTRAVENOUS | Status: AC
  Administered 2016-04-09: 11:00:00 via INTRAVENOUS
  Filled 2016-04-09: qty 1000

## 2016-04-09 MED ORDER — POTASSIUM CHLORIDE 2 MEQ/ML IV SOLN
Freq: Once | INTRAVENOUS | Status: AC
  Administered 2016-04-09: 11:00:00 via INTRAVENOUS
  Filled 2016-04-09: qty 1000

## 2016-04-09 NOTE — Progress Notes (Signed)
Patient is here for follow up, she is doing much better as far as her ear pressure. She rates her hearing as about 40% where it was 10% when she had the ear pain and pressure

## 2016-04-10 ENCOUNTER — Ambulatory Visit
Admission: RE | Admit: 2016-04-10 | Discharge: 2016-04-10 | Disposition: A | Discharge status: Home / Self Care | Payer: BLUE CROSS/BLUE SHIELD | Source: Ambulatory Visit | Attending: Radiation Oncology | Admitting: Radiation Oncology

## 2016-04-10 DIAGNOSIS — C090 Malignant neoplasm of tonsillar fossa: Secondary | ICD-10-CM | POA: Diagnosis not present

## 2016-04-11 ENCOUNTER — Ambulatory Visit
Admission: RE | Admit: 2016-04-11 | Discharge: 2016-04-11 | Disposition: A | Discharge status: Home / Self Care | Payer: BLUE CROSS/BLUE SHIELD | Source: Ambulatory Visit | Attending: Radiation Oncology | Admitting: Radiation Oncology

## 2016-04-11 DIAGNOSIS — C090 Malignant neoplasm of tonsillar fossa: Secondary | ICD-10-CM | POA: Diagnosis not present

## 2016-04-12 ENCOUNTER — Ambulatory Visit
Admission: RE | Admit: 2016-04-12 | Discharge: 2016-04-12 | Disposition: A | Discharge status: Home / Self Care | Payer: BLUE CROSS/BLUE SHIELD | Source: Ambulatory Visit | Attending: Radiation Oncology | Admitting: Radiation Oncology

## 2016-04-12 DIAGNOSIS — C090 Malignant neoplasm of tonsillar fossa: Secondary | ICD-10-CM | POA: Diagnosis not present

## 2016-04-15 ENCOUNTER — Ambulatory Visit
Admission: RE | Admit: 2016-04-15 | Discharge: 2016-04-15 | Disposition: A | Discharge status: Home / Self Care | Payer: BLUE CROSS/BLUE SHIELD | Source: Ambulatory Visit | Attending: Radiation Oncology | Admitting: Radiation Oncology

## 2016-04-15 DIAGNOSIS — C090 Malignant neoplasm of tonsillar fossa: Secondary | ICD-10-CM | POA: Diagnosis not present

## 2016-04-15 NOTE — Progress Notes (Signed)
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  INTERVAL HISTORY: Patient returns to clinic today for further evaluation and consideration of cycle 4 of weekly cisplatin. She continues to tolerate her treatments without significant side effects. She has a decreased appetite which she attributes to change in taste. She does admit to weakness and fatigue particularly towards the end of the day. Her left neck tenderness has essentially resolved and her hearing is improving. She has no neurologic complaints. She denies any recent fevers. She denies any dysphasia or mouth sores. She has no chest pain or shortness of breath. She denies any vomiting or diarrhea. She has no urinary complaints. She denies hematuria or hematochezia. Patient otherwise feels well and offers no further specific complaints.  REVIEW OF SYSTEMS:   Review of Systems  Constitutional: Negative.  Negative for fever, malaise/fatigue and weight loss.  HENT: Negative for ear pain, hearing loss and sore throat.   Respiratory: Negative.  Negative for cough and shortness of breath.   Cardiovascular: Negative.  Negative for chest pain and leg swelling.  Gastrointestinal: Positive for constipation and nausea. Negative for abdominal pain.  Genitourinary: Negative.   Musculoskeletal: Negative.   Neurological: Negative for tingling, sensory change, weakness and headaches.  Psychiatric/Behavioral: The patient is nervous/anxious and has insomnia.     As per HPI. Otherwise, a complete review of systems is negative.  PAST MEDICAL HISTORY: Past Medical History:  Diagnosis Date  . Arthritis   . Cancer ([place]) 03/13/2016   Malignant neoplasm of tonsil  . Hypertension   . Postmenopausal      PAST SURGICAL HISTORY: No past surgical history on file.  FAMILY HISTORY: Negative and noncontributory. No reported history of malignancy or chronic disease.  ADVANCED DIRECTIVES (Y/N):  N  HEALTH MAINTENANCE: Social History  Substance Use Topics  . Smoking status: Never Smoker  . Smokeless tobacco: Never Used  . Alcohol use Not on file     Colonoscopy:  PAP:  Bone density:  Lipid panel:  No Known Allergies  Current Outpatient Prescriptions  Medication Sig Dispense Refill  . alendronate (FOSAMAX) 70 MG tablet Take 70 mg by mouth once a week. Take with a full glass of water on an empty stomach.    . ALPRAZolam (XANAX) 1 MG tablet Take 1 mg by mouth at bedtime as needed for anxiety.    Marland Kitchen amLODipine (NORVASC) 10 MG tablet Take 10 mg by mouth daily.    . Cholecalciferol (VITAMIN D) 2000 units tablet Take 2,000 Units by mouth daily.    . clobetasol cream (TEMOVATE) AB-123456789 % Apply 1 application topically 4 (four) times a week.    . co-enzyme Q-10 30 MG capsule Take 100 mg by mouth daily.    . finasteride (PROSCAR) 5 MG tablet Take 5 mg by mouth daily.    . hydrochlorothiazide (HYDRODIURIL) 25 MG tablet Take 25 mg by mouth daily.    Marland Kitchen lidocaine-prilocaine (EMLA) cream Apply to affected area once 30 g 3  . lisinopril (PRINIVIL,ZESTRIL) 40 MG tablet Take 40 mg by mouth daily.    . niacin (NIASPAN) 500 MG CR tablet Take 500 mg by mouth at bedtime.    . Omega-3 Fatty Acids (FISH OIL OMEGA-3 PO) Take 1 capsule by mouth 4 (four) times a week.    Marland Kitchen  ondansetron (ZOFRAN) 8 MG tablet Take 1 tablet (8 mg total) by mouth 2 (two) times daily as needed. 30 tablet 1  . oxyCODONE-acetaminophen (PERCOCET/ROXICET) 5-325 MG tablet   0  . Pioglitazone HCl (ACTOS PO) Take 7.5 mg by mouth daily.    . pravastatin (PRAVACHOL) 40 MG tablet Take 40 mg by mouth daily.    . prochlorperazine (COMPAZINE) 10 MG tablet Take 1 tablet (10 mg total) by mouth every 6 (six) hours as needed (Nausea or vomiting). 30  tablet 1  . zolpidem (AMBIEN) 5 MG tablet Take 1 tablet (5 mg total) by mouth at bedtime as needed for sleep. 30 tablet 0   No current facility-administered medications for this visit.     OBJECTIVE: Vitals:   04/16/16 1042  BP: 108/69  Pulse: 72  Resp: 18  Temp: 97 F (36.1 C)     Body mass index is 24.34 kg/m.    ECOG FS:0 - Asymptomatic  General: Well-developed, well-nourished, no acute distress. Eyes: Pink conjunctiva; reddened, anicteric sclera. HEENT: Normocephalic, moist mucous membranes, mass seen on left tonsillar area. No palpable neck lymphadenopathy.  Lungs: Clear to auscultation bilaterally. Heart: Regular rate and rhythm. No rubs, murmurs, or gallops. Abdomen: Soft, nontender, nondistended. No organomegaly noted, normoactive bowel sounds. Musculoskeletal: No edema, cyanosis, or clubbing. Neuro: Alert, answering all questions appropriately. Cranial nerves grossly intact. Skin: No rashes or petechiae noted. Psych: Normal affect. Lymphatics: No calvicular, axillary or inguinal LAD.   LAB RESULTS:  Lab Results  Component Value Date   NA 130 (L) 04/16/2016   K 3.6 04/16/2016   CL 95 (L) 04/16/2016   CO2 29 04/16/2016   GLUCOSE 93 04/16/2016   BUN 18 04/16/2016   CREATININE 0.68 04/16/2016   CALCIUM 9.3 04/16/2016   GFRNONAA >60 04/16/2016   GFRAA >60 04/16/2016    Lab Results  Component Value Date   WBC 5.3 04/16/2016   NEUTROABS 3.9 04/16/2016   HGB 12.0 04/16/2016   HCT 33.7 (L) 04/16/2016   MCV 91.7 04/16/2016   PLT 185 04/16/2016     STUDIES: No results found.  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 PLAN:    1. 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 PET scan results reviewed independently and reported as above. Given the clinical stage of her disease, she will benefit from concurrent XRT and chemotherapy using weekly cisplatin. Induction chemotherapy  was not recommended. Proceed with cycle 4 of weekly cisplatin today. Return to clinic in 1 week for further evaluation and consideration of cycle 5. 2. Ear pain: Resolved.  Likely secondary to malignancy.  3. Constipation: Continue OTC treatments as needed.  4. Nausea: Mild, continue prescribed medications as needed.  Monitor.   Patient expressed understanding and was in agreement with this plan. She also understands that She can call clinic at any time with any questions, concerns, or complaints.    Lloyd Huger, MD 04/20/16 8:08 AM

## 2016-04-16 ENCOUNTER — Inpatient Hospital Stay: Payer: BLUE CROSS/BLUE SHIELD

## 2016-04-16 ENCOUNTER — Ambulatory Visit
Admission: RE | Admit: 2016-04-16 | Discharge: 2016-04-16 | Disposition: A | Discharge status: Home / Self Care | Payer: BLUE CROSS/BLUE SHIELD | Source: Ambulatory Visit | Attending: Radiation Oncology | Admitting: Radiation Oncology

## 2016-04-16 ENCOUNTER — Inpatient Hospital Stay: Payer: BLUE CROSS/BLUE SHIELD | Attending: Oncology

## 2016-04-16 ENCOUNTER — Inpatient Hospital Stay (HOSPITAL_BASED_OUTPATIENT_CLINIC_OR_DEPARTMENT_OTHER): Payer: BLUE CROSS/BLUE SHIELD | Admitting: Oncology

## 2016-04-16 VITALS — BP 108/69 | HR 72 | Temp 97.0°F | Resp 18 | Wt 146.3 lb

## 2016-04-16 VITALS — BP 125/75 | HR 68 | Temp 97.1°F | Resp 18

## 2016-04-16 DIAGNOSIS — R531 Weakness: Secondary | ICD-10-CM | POA: Insufficient documentation

## 2016-04-16 DIAGNOSIS — R630 Anorexia: Secondary | ICD-10-CM | POA: Insufficient documentation

## 2016-04-16 DIAGNOSIS — R110 Nausea: Secondary | ICD-10-CM | POA: Diagnosis not present

## 2016-04-16 DIAGNOSIS — F419 Anxiety disorder, unspecified: Secondary | ICD-10-CM | POA: Insufficient documentation

## 2016-04-16 DIAGNOSIS — M542 Cervicalgia: Secondary | ICD-10-CM | POA: Diagnosis not present

## 2016-04-16 DIAGNOSIS — D696 Thrombocytopenia, unspecified: Secondary | ICD-10-CM | POA: Diagnosis not present

## 2016-04-16 DIAGNOSIS — G4700 Insomnia, unspecified: Secondary | ICD-10-CM | POA: Diagnosis not present

## 2016-04-16 DIAGNOSIS — R5383 Other fatigue: Secondary | ICD-10-CM

## 2016-04-16 DIAGNOSIS — Z5111 Encounter for antineoplastic chemotherapy: Secondary | ICD-10-CM | POA: Insufficient documentation

## 2016-04-16 DIAGNOSIS — Z79899 Other long term (current) drug therapy: Secondary | ICD-10-CM | POA: Diagnosis not present

## 2016-04-16 DIAGNOSIS — M129 Arthropathy, unspecified: Secondary | ICD-10-CM

## 2016-04-16 DIAGNOSIS — C090 Malignant neoplasm of tonsillar fossa: Secondary | ICD-10-CM | POA: Diagnosis not present

## 2016-04-16 DIAGNOSIS — I10 Essential (primary) hypertension: Secondary | ICD-10-CM | POA: Insufficient documentation

## 2016-04-16 DIAGNOSIS — C099 Malignant neoplasm of tonsil, unspecified: Secondary | ICD-10-CM

## 2016-04-16 DIAGNOSIS — D709 Neutropenia, unspecified: Secondary | ICD-10-CM | POA: Insufficient documentation

## 2016-04-16 DIAGNOSIS — K5900 Constipation, unspecified: Secondary | ICD-10-CM | POA: Diagnosis not present

## 2016-04-16 LAB — BASIC METABOLIC PANEL
Anion gap: 6 (ref 5–15)
BUN: 18 mg/dL (ref 6–20)
CHLORIDE: 95 mmol/L — AB (ref 101–111)
CO2: 29 mmol/L (ref 22–32)
Calcium: 9.3 mg/dL (ref 8.9–10.3)
Creatinine, Ser: 0.68 mg/dL (ref 0.44–1.00)
GFR calc non Af Amer: >60 mL/min (ref >60)
Glucose, Bld: 93 mg/dL (ref 65–99)
POTASSIUM: 3.6 mmol/L (ref 3.5–5.1)
Sodium: 130 mmol/L — ABNORMAL LOW (ref 135–145)

## 2016-04-16 LAB — CBC WITH DIFFERENTIAL/PLATELET
BASOS PCT: 0 %
Basophils Absolute: 0 10*3/uL (ref 0–0.1)
EOS ABS: 0.1 10*3/uL (ref 0–0.7)
Eosinophils Relative: 1 %
HEMATOCRIT: 33.7 % — AB (ref 35.0–47.0)
HEMOGLOBIN: 12 g/dL (ref 12.0–16.0)
LYMPHS ABS: 0.5 10*3/uL — AB (ref 1.0–3.6)
Lymphocytes Relative: 9 %
MCH: 32.6 pg (ref 26.0–34.0)
MCHC: 35.5 g/dL (ref 32.0–36.0)
MCV: 91.7 fL (ref 80.0–100.0)
MONOS PCT: 16 %
Monocytes Absolute: 0.9 10*3/uL (ref 0.2–0.9)
NEUTROS ABS: 3.9 10*3/uL (ref 1.4–6.5)
NEUTROS PCT: 74 %
Platelets: 185 10*3/uL (ref 150–440)
RBC: 3.68 MIL/uL — AB (ref 3.80–5.20)
RDW: 12.7 % (ref 11.5–14.5)
WBC: 5.3 10*3/uL (ref 3.6–11.0)

## 2016-04-16 MED ORDER — SODIUM CHLORIDE 0.9 % IV SOLN
Freq: Once | INTRAVENOUS | Status: AC
  Administered 2016-04-16: 12:00:00 via INTRAVENOUS
  Filled 2016-04-16: qty 1000

## 2016-04-16 MED ORDER — CISPLATIN CHEMO INJECTION 100MG/100ML
40.0000 mg/m2 | Freq: Once | INTRAVENOUS | Status: AC
  Administered 2016-04-16: 71 mg via INTRAVENOUS
  Filled 2016-04-16: qty 71

## 2016-04-16 MED ORDER — ZOLPIDEM TARTRATE 5 MG PO TABS: 5.0000 mg | ORAL_TABLET | Freq: Every evening | ORAL | 0 refills | Status: DC | PRN

## 2016-04-16 MED ORDER — PALONOSETRON HCL INJECTION 0.25 MG/5ML
0.2500 mg | Freq: Once | INTRAVENOUS | Status: AC
  Administered 2016-04-16: 0.25 mg via INTRAVENOUS
  Filled 2016-04-16: qty 5

## 2016-04-16 MED ORDER — SODIUM CHLORIDE 0.9 % IV SOLN
Freq: Once | INTRAVENOUS | Status: AC
  Administered 2016-04-16: 14:00:00 via INTRAVENOUS
  Filled 2016-04-16: qty 5

## 2016-04-16 MED ORDER — DEXTROSE-NACL 5-0.45 % IV SOLN
Freq: Once | INTRAVENOUS | Status: AC
  Administered 2016-04-16: 12:00:00 via INTRAVENOUS
  Filled 2016-04-16: qty 1000

## 2016-04-16 NOTE — Progress Notes (Signed)
Patient is here today for follow up, she does mention she eats but everything is bitter. She would like something to help her sleep.

## 2016-04-17 ENCOUNTER — Ambulatory Visit
Admission: RE | Admit: 2016-04-17 | Discharge: 2016-04-17 | Disposition: A | Discharge status: Home / Self Care | Payer: BLUE CROSS/BLUE SHIELD | Source: Ambulatory Visit | Attending: Radiation Oncology | Admitting: Radiation Oncology

## 2016-04-17 DIAGNOSIS — C090 Malignant neoplasm of tonsillar fossa: Secondary | ICD-10-CM | POA: Diagnosis not present

## 2016-04-18 ENCOUNTER — Ambulatory Visit
Admission: RE | Admit: 2016-04-18 | Discharge: 2016-04-18 | Disposition: A | Discharge status: Home / Self Care | Payer: BLUE CROSS/BLUE SHIELD | Source: Ambulatory Visit | Attending: Radiation Oncology | Admitting: Radiation Oncology

## 2016-04-18 DIAGNOSIS — C090 Malignant neoplasm of tonsillar fossa: Secondary | ICD-10-CM | POA: Diagnosis not present

## 2016-04-19 ENCOUNTER — Ambulatory Visit
Admission: RE | Admit: 2016-04-19 | Discharge: 2016-04-19 | Disposition: A | Discharge status: Home / Self Care | Payer: BLUE CROSS/BLUE SHIELD | Source: Ambulatory Visit | Attending: Radiation Oncology | Admitting: Radiation Oncology

## 2016-04-19 DIAGNOSIS — C090 Malignant neoplasm of tonsillar fossa: Secondary | ICD-10-CM | POA: Diagnosis not present

## 2016-04-22 ENCOUNTER — Ambulatory Visit
Admission: RE | Admit: 2016-04-22 | Discharge: 2016-04-22 | Disposition: A | Discharge status: Home / Self Care | Payer: BLUE CROSS/BLUE SHIELD | Source: Ambulatory Visit | Attending: Radiation Oncology | Admitting: Radiation Oncology

## 2016-04-22 ENCOUNTER — Other Ambulatory Visit: Payer: Self-pay | Admitting: *Deleted

## 2016-04-22 DIAGNOSIS — C090 Malignant neoplasm of tonsillar fossa: Secondary | ICD-10-CM | POA: Diagnosis not present

## 2016-04-22 MED ORDER — SUCRALFATE 1 G PO TABS: 1.0000 g | ORAL_TABLET | Freq: Four times a day (QID) | ORAL | 3 refills | Status: DC

## 2016-04-22 NOTE — Progress Notes (Signed)
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  INTERVAL HISTORY: Patient returns to clinic today for further evaluation and consideration of cycle 5 of weekly cisplatin. She continues to tolerate her treatments without significant side effects. She does admit to mild weakness and fatigue particularly towards the end of the day. Her left neck tenderness has essentially resolved and her hearing is improving. She has no neurologic complaints. She denies any recent fevers. She denies any dysphasia or mouth sores. She has no chest pain or shortness of breath. She denies any vomiting or diarrhea. She has no urinary complaints. She denies hematuria or hematochezia. Patient otherwise feels well and offers no further specific complaints.  REVIEW OF SYSTEMS:   Review of Systems  Constitutional: Negative.  Negative for fever, malaise/fatigue and weight loss.  HENT: Negative for ear pain, hearing loss and sore throat.   Respiratory: Negative.  Negative for cough and shortness of breath.   Cardiovascular: Negative.  Negative for chest pain and leg swelling.  Gastrointestinal: Positive for constipation and nausea. Negative for abdominal pain.  Genitourinary: Negative.   Musculoskeletal: Negative.   Neurological: Negative for tingling, sensory change, weakness and headaches.  Psychiatric/Behavioral: The patient is nervous/anxious and has insomnia.     As per HPI. Otherwise, a complete review of systems is negative.  PAST MEDICAL HISTORY: Past Medical History:  Diagnosis Date  . Arthritis   . Cancer ([place]) 03/13/2016   Malignant neoplasm of tonsil  . Hypertension   . Postmenopausal     PAST SURGICAL HISTORY: No past surgical history on  file.  FAMILY HISTORY: Negative and noncontributory. No reported history of malignancy or chronic disease.  ADVANCED DIRECTIVES (Y/N):  N  HEALTH MAINTENANCE: Social History  Substance Use Topics  . Smoking status: Never Smoker  . Smokeless tobacco: Never Used  . Alcohol use Not on file     Colonoscopy:  PAP:  Bone density:  Lipid panel:  No Known Allergies  Current Outpatient Prescriptions  Medication Sig Dispense Refill  . alendronate (FOSAMAX) 70 MG tablet Take 70 mg by mouth once a week. Take with a full glass of water on an empty stomach.    Marland Kitchen amLODipine (NORVASC) 10 MG tablet Take 10 mg by mouth daily.    . Cholecalciferol (VITAMIN D) 2000 units tablet Take 2,000 Units by mouth daily.    . clobetasol cream (TEMOVATE) AB-123456789 % Apply 1 application topically 4 (four) times a week.    . co-enzyme Q-10 30 MG capsule Take 100 mg by mouth daily.    . finasteride (PROSCAR) 5 MG tablet Take 5 mg by mouth daily.    . hydrochlorothiazide (HYDRODIURIL) 25 MG tablet Take 25 mg by mouth daily.    Marland Kitchen lidocaine-prilocaine (EMLA) cream Apply to affected area once 30 g 3  . lisinopril (PRINIVIL,ZESTRIL) 40 MG tablet Take 40 mg by mouth daily.    . niacin (NIASPAN) 500 MG CR tablet Take 500 mg by mouth at bedtime.    . Omega-3 Fatty Acids (FISH OIL OMEGA-3 PO) Take 1 capsule by mouth 4 (four) times a week.    . ondansetron (ZOFRAN) 8 MG tablet Take 1 tablet (8 mg total) by mouth 2 (two) times daily as needed. 30 tablet 1  . Pioglitazone HCl (ACTOS PO) Take  7.5 mg by mouth daily.    . pravastatin (PRAVACHOL) 40 MG tablet Take 40 mg by mouth daily.    . prochlorperazine (COMPAZINE) 10 MG tablet Take 1 tablet (10 mg total) by mouth every 6 (six) hours as needed (Nausea or vomiting). 30 tablet 1  . sucralfate (CARAFATE) 1 g tablet Take 1 tablet (1 g total) by mouth 4 (four) times daily. 120 tablet 3  . zolpidem (AMBIEN) 5 MG tablet Take 1 tablet (5 mg total) by mouth at bedtime as needed for  sleep. 30 tablet 0  . oxyCODONE-acetaminophen (PERCOCET/ROXICET) 5-325 MG tablet   0   No current facility-administered medications for this visit.     OBJECTIVE: Vitals:   04/23/16 1007  BP: 105/88  Pulse: 73  Resp: 18  Temp: 98.7 F (37.1 C)     Body mass index is 24.56 kg/m.    ECOG FS:0 - Asymptomatic  General: Well-developed, well-nourished, no acute distress. Eyes: Pink conjunctiva; reddened, anicteric sclera. HEENT: Normocephalic, moist mucous membranes, mass seen on left tonsillar area. No palpable neck lymphadenopathy.  Lungs: Clear to auscultation bilaterally. Heart: Regular rate and rhythm. No rubs, murmurs, or gallops. Abdomen: Soft, nontender, nondistended. No organomegaly noted, normoactive bowel sounds. Musculoskeletal: No edema, cyanosis, or clubbing. Neuro: Alert, answering all questions appropriately. Cranial nerves grossly intact. Skin: No rashes or petechiae noted. Psych: Normal affect. Lymphatics: No calvicular, axillary or inguinal LAD.   LAB RESULTS:  Lab Results  Component Value Date   NA 127 (L) 04/23/2016   K 4.1 04/23/2016   CL 93 (L) 04/23/2016   CO2 26 04/23/2016   GLUCOSE 95 04/23/2016   BUN 15 04/23/2016   CREATININE 0.67 04/23/2016   CALCIUM 8.7 (L) 04/23/2016   GFRNONAA >60 04/23/2016   GFRAA >60 04/23/2016    Lab Results  Component Value Date   WBC 4.1 04/23/2016   NEUTROABS 2.9 04/23/2016   HGB 9.9 (L) 04/23/2016   HCT 27.5 (L) 04/23/2016   MCV 91.6 04/23/2016   PLT 128 (L) 04/23/2016     STUDIES: No results found.  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 PLAN:    1. 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 PET scan results reviewed independently. Given the clinical stage of her disease, she will benefit from concurrent XRT and chemotherapy using weekly cisplatin. Induction chemotherapy was not recommended. Proceed with cycle 5 of  weekly cisplatin today. Continue daily XRT which will be completed on May 20, 2016.  Return to clinic in 1 week for further evaluation and consideration of cycle 6. 2. Ear pain: Resolved.  Likely secondary to malignancy.  3. Constipation: Continue OTC treatments as needed.  4. Nausea: Mild, continue prescribed medications as needed.  Monitor.   Patient expressed understanding and was in agreement with this plan. She also understands that She can call clinic at any time with any questions, concerns, or complaints.    Lloyd Huger, MD 04/25/16 6:07 PM

## 2016-04-23 ENCOUNTER — Inpatient Hospital Stay (HOSPITAL_BASED_OUTPATIENT_CLINIC_OR_DEPARTMENT_OTHER): Payer: BLUE CROSS/BLUE SHIELD | Admitting: Oncology

## 2016-04-23 ENCOUNTER — Ambulatory Visit
Admission: RE | Admit: 2016-04-23 | Discharge: 2016-04-23 | Disposition: A | Discharge status: Home / Self Care | Payer: BLUE CROSS/BLUE SHIELD | Source: Ambulatory Visit | Attending: Radiation Oncology | Admitting: Radiation Oncology

## 2016-04-23 ENCOUNTER — Inpatient Hospital Stay: Payer: BLUE CROSS/BLUE SHIELD

## 2016-04-23 VITALS — BP 105/88 | HR 73 | Temp 98.7°F | Resp 18 | Wt 147.6 lb

## 2016-04-23 DIAGNOSIS — C099 Malignant neoplasm of tonsil, unspecified: Secondary | ICD-10-CM

## 2016-04-23 DIAGNOSIS — C090 Malignant neoplasm of tonsillar fossa: Secondary | ICD-10-CM | POA: Diagnosis not present

## 2016-04-23 DIAGNOSIS — K5900 Constipation, unspecified: Secondary | ICD-10-CM | POA: Diagnosis not present

## 2016-04-23 DIAGNOSIS — Z79899 Other long term (current) drug therapy: Secondary | ICD-10-CM

## 2016-04-23 LAB — BASIC METABOLIC PANEL
ANION GAP: 8 (ref 5–15)
BUN: 15 mg/dL (ref 6–20)
CALCIUM: 8.7 mg/dL — AB (ref 8.9–10.3)
CO2: 26 mmol/L (ref 22–32)
Chloride: 93 mmol/L — ABNORMAL LOW (ref 101–111)
Creatinine, Ser: 0.67 mg/dL (ref 0.44–1.00)
Glucose, Bld: 95 mg/dL (ref 65–99)
Potassium: 4.1 mmol/L (ref 3.5–5.1)
Sodium: 127 mmol/L — ABNORMAL LOW (ref 135–145)

## 2016-04-23 LAB — CBC WITH DIFFERENTIAL/PLATELET
BASOS ABS: 0 10*3/uL (ref 0–0.1)
BASOS PCT: 0 %
EOS ABS: 0 10*3/uL (ref 0–0.7)
EOS PCT: 1 %
HCT: 27.5 % — ABNORMAL LOW (ref 35.0–47.0)
Hemoglobin: 9.9 g/dL — ABNORMAL LOW (ref 12.0–16.0)
Lymphocytes Relative: 11 %
Lymphs Abs: 0.4 10*3/uL — ABNORMAL LOW (ref 1.0–3.6)
MCH: 33.1 pg (ref 26.0–34.0)
MCHC: 36.2 g/dL — ABNORMAL HIGH (ref 32.0–36.0)
MCV: 91.6 fL (ref 80.0–100.0)
Monocytes Absolute: 0.6 10*3/uL (ref 0.2–0.9)
Monocytes Relative: 16 %
Neutro Abs: 2.9 10*3/uL (ref 1.4–6.5)
Neutrophils Relative %: 72 %
PLATELETS: 128 10*3/uL — AB (ref 150–440)
RBC: 3 MIL/uL — ABNORMAL LOW (ref 3.80–5.20)
RDW: 13 % (ref 11.5–14.5)
WBC: 4.1 10*3/uL (ref 3.6–11.0)

## 2016-04-23 MED ORDER — DEXAMETHASONE 4 MG PO TABS
8.0000 mg | ORAL_TABLET | Freq: Every day | ORAL | Status: DC
  Administered 2016-04-23: 8 mg via ORAL
  Filled 2016-04-23: qty 2

## 2016-04-23 MED ORDER — SODIUM CHLORIDE 0.9 % IV SOLN
40.0000 mg/m2 | Freq: Once | INTRAVENOUS | Status: AC
  Administered 2016-04-23: 71 mg via INTRAVENOUS
  Filled 2016-04-23: qty 71

## 2016-04-23 MED ORDER — ONDANSETRON HCL 4 MG PO TABS
8.0000 mg | ORAL_TABLET | Freq: Once | ORAL | Status: AC
  Administered 2016-04-23: 8 mg via ORAL
  Filled 2016-04-23: qty 2

## 2016-04-23 MED ORDER — POTASSIUM CHLORIDE 2 MEQ/ML IV SOLN
Freq: Once | INTRAVENOUS | Status: AC
  Administered 2016-04-23: 11:00:00 via INTRAVENOUS
  Filled 2016-04-23: qty 10

## 2016-04-23 MED ORDER — SODIUM CHLORIDE 0.9 % IV SOLN
Freq: Once | INTRAVENOUS | Status: AC
  Administered 2016-04-23: 11:00:00 via INTRAVENOUS
  Filled 2016-04-23: qty 1000

## 2016-04-23 NOTE — Progress Notes (Signed)
Patient does not want IV premeds.  MD changed to PO pre meds.

## 2016-04-23 NOTE — Progress Notes (Signed)
Patient is here for follow up, she is doing ok she has some pain in her throat.

## 2016-04-24 ENCOUNTER — Ambulatory Visit
Admission: RE | Admit: 2016-04-24 | Discharge: 2016-04-24 | Disposition: A | Discharge status: Home / Self Care | Payer: BLUE CROSS/BLUE SHIELD | Source: Ambulatory Visit | Attending: Radiation Oncology | Admitting: Radiation Oncology

## 2016-04-24 DIAGNOSIS — C090 Malignant neoplasm of tonsillar fossa: Secondary | ICD-10-CM | POA: Diagnosis not present

## 2016-04-25 ENCOUNTER — Ambulatory Visit
Admission: RE | Admit: 2016-04-25 | Discharge: 2016-04-25 | Disposition: A | Discharge status: Home / Self Care | Payer: BLUE CROSS/BLUE SHIELD | Source: Ambulatory Visit | Attending: Radiation Oncology | Admitting: Radiation Oncology

## 2016-04-25 DIAGNOSIS — C090 Malignant neoplasm of tonsillar fossa: Secondary | ICD-10-CM | POA: Diagnosis not present

## 2016-04-26 ENCOUNTER — Ambulatory Visit
Admission: RE | Admit: 2016-04-26 | Discharge: 2016-04-26 | Disposition: A | Discharge status: Home / Self Care | Payer: BLUE CROSS/BLUE SHIELD | Source: Ambulatory Visit | Attending: Radiation Oncology | Admitting: Radiation Oncology

## 2016-04-26 DIAGNOSIS — C090 Malignant neoplasm of tonsillar fossa: Secondary | ICD-10-CM | POA: Diagnosis not present

## 2016-04-29 ENCOUNTER — Ambulatory Visit
Admission: RE | Admit: 2016-04-29 | Discharge: 2016-04-29 | Disposition: A | Discharge status: Home / Self Care | Payer: BLUE CROSS/BLUE SHIELD | Source: Ambulatory Visit | Attending: Radiation Oncology | Admitting: Radiation Oncology

## 2016-04-29 DIAGNOSIS — C090 Malignant neoplasm of tonsillar fossa: Secondary | ICD-10-CM | POA: Diagnosis not present

## 2016-04-29 NOTE — Progress Notes (Signed)
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  INTERVAL HISTORY: Patient returns to clinic today for further evaluation and consideration of cycle 6 of weekly cisplatin. She continues to tolerate her treatments without significant side effects, although is having more pain this week. She also continues to have mild weakness and fatigue particularly towards the end of the day. She has no neurologic complaints. She denies any recent fevers. She denies any dysphasia or mouth sores. She has no chest pain or shortness of breath. She denies any vomiting or diarrhea. She has no urinary complaints. She denies hematuria or hematochezia. Patient otherwise feels well and offers no further specific complaints.  REVIEW OF SYSTEMS:   Review of Systems  Constitutional: Negative.  Negative for fever, malaise/fatigue and weight loss.  HENT: Negative for ear pain, hearing loss and sore throat.   Respiratory: Negative.  Negative for cough and shortness of breath.   Cardiovascular: Negative.  Negative for chest pain and leg swelling.  Gastrointestinal: Positive for constipation and nausea. Negative for abdominal pain.  Genitourinary: Negative.   Musculoskeletal: Positive for neck pain.  Neurological: Negative for tingling, sensory change, weakness and headaches.  Psychiatric/Behavioral: The patient is nervous/anxious and has insomnia.     As per HPI. Otherwise, a complete review of systems is negative.  PAST MEDICAL HISTORY: Past Medical History:  Diagnosis Date  . Arthritis   . Cancer ([place]) 03/13/2016   Malignant neoplasm of tonsil  . Hypertension   . Postmenopausal     PAST SURGICAL HISTORY: History reviewed. No pertinent surgical  history.  FAMILY HISTORY: Negative and noncontributory. No reported history of malignancy or chronic disease.  ADVANCED DIRECTIVES (Y/N):  N  HEALTH MAINTENANCE: Social History  Substance Use Topics  . Smoking status: Never Smoker  . Smokeless tobacco: Never Used  . Alcohol use Not on file     Colonoscopy:  PAP:  Bone density:  Lipid panel:  No Known Allergies  Current Outpatient Prescriptions  Medication Sig Dispense Refill  . alendronate (FOSAMAX) 70 MG tablet Take 70 mg by mouth once a week. Take with a full glass of water on an empty stomach.    Marland Kitchen amLODipine (NORVASC) 10 MG tablet Take 10 mg by mouth daily.    . Cholecalciferol (VITAMIN D) 2000 units tablet Take 2,000 Units by mouth daily.    . clobetasol cream (TEMOVATE) AB-123456789 % Apply 1 application topically 4 (four) times a week.    . co-enzyme Q-10 30 MG capsule Take 100 mg by mouth daily.    . finasteride (PROSCAR) 5 MG tablet Take 5 mg by mouth daily.    . hydrochlorothiazide (HYDRODIURIL) 25 MG tablet Take 25 mg by mouth daily.    Marland Kitchen lidocaine-prilocaine (EMLA) cream Apply to affected area once 30 g 3  . lisinopril (PRINIVIL,ZESTRIL) 40 MG tablet Take 40 mg by mouth daily.    . magic mouthwash w/lidocaine SOLN Take 5 mLs by mouth 3 (three) times daily as needed for mouth pain. 300 mL 0  . niacin (NIASPAN) 500 MG CR tablet Take 500 mg by mouth at bedtime.    . Omega-3 Fatty Acids (FISH OIL OMEGA-3 PO) Take 1 capsule by mouth 4 (four) times a week.    . ondansetron (ZOFRAN) 8 MG tablet Take 1 tablet (  8 mg total) by mouth 2 (two) times daily as needed. 30 tablet 1  . Pioglitazone HCl (ACTOS PO) Take 7.5 mg by mouth daily.    . pravastatin (PRAVACHOL) 40 MG tablet Take 40 mg by mouth daily.    . prochlorperazine (COMPAZINE) 10 MG tablet Take 1 tablet (10 mg total) by mouth every 6 (six) hours as needed (Nausea or vomiting). 60 tablet 1  . sucralfate (CARAFATE) 1 g tablet Take 1 tablet (1 g total) by mouth 4 (four) times  daily. 120 tablet 3  . zolpidem (AMBIEN) 5 MG tablet Take 1 tablet (5 mg total) by mouth at bedtime as needed for sleep. 30 tablet 0  . oxyCODONE-acetaminophen (PERCOCET/ROXICET) 5-325 MG tablet Take 1 tablet by mouth every 4 (four) hours as needed for severe pain. 30 tablet 0  . silver sulfADIAZINE (SILVADENE) 1 % cream Apply 1 application topically 2 (two) times daily. 50 g 2   No current facility-administered medications for this visit.     OBJECTIVE: Vitals:   04/30/16 1108  BP: 105/68  Pulse: 75     Body mass index is 24.03 kg/m.    ECOG FS:0 - Asymptomatic  General: Well-developed, well-nourished, no acute distress. Eyes: Pink conjunctiva; reddened, anicteric sclera. HEENT: Normocephalic, moist mucous membranes, mass seen on left tonsillar area. No palpable neck lymphadenopathy.  Lungs: Clear to auscultation bilaterally. Heart: Regular rate and rhythm. No rubs, murmurs, or gallops. Abdomen: Soft, nontender, nondistended. No organomegaly noted, normoactive bowel sounds. Musculoskeletal: No edema, cyanosis, or clubbing. Neuro: Alert, answering all questions appropriately. Cranial nerves grossly intact. Skin: No rashes or petechiae noted. Psych: Normal affect. Lymphatics: No calvicular, axillary or inguinal LAD.   LAB RESULTS:  Lab Results  Component Value Date   NA 128 (L) 04/30/2016   K 3.5 04/30/2016   CL 92 (L) 04/30/2016   CO2 27 04/30/2016   GLUCOSE 108 (H) 04/30/2016   BUN 14 04/30/2016   CREATININE 0.84 04/30/2016   CALCIUM 9.0 04/30/2016   GFRNONAA >60 04/30/2016   GFRAA >60 04/30/2016    Lab Results  Component Value Date   WBC 2.7 (L) 04/30/2016   NEUTROABS 2.0 04/30/2016   HGB 9.6 (L) 04/30/2016   HCT 26.2 (L) 04/30/2016   MCV 91.2 04/30/2016   PLT 115 (L) 04/30/2016     STUDIES: No results found.  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 PLAN:    1. Clinical stage IVa (T2,N2c,M0) left tonsillar  squamous cell carcinoma: HPV positive. PET scan results reviewed independently. Given the clinical stage of her disease, she will benefit from concurrent XRT and chemotherapy using weekly cisplatin. Induction chemotherapy was not recommended. Proceed with cycle 6 of weekly cisplatin today. Continue daily XRT which will be completed on May 20, 2016.  Return to clinic in 1 week for further evaluation and consideration of cycle 7. 2. Ear pain: Resolved.  Likely secondary to malignancy.  3. Constipation: Continue OTC treatments as needed.  4. Nausea: Mild, continue prescribed medications as needed.  Monitor. 5. Neck pain: Patient was given a prescription for Magic mouthwash as well as a refill on her Percocet.   Patient expressed understanding and was in agreement with this plan. She also understands that She can call clinic at any time with any questions, concerns, or complaints.    Lloyd Huger, MD 05/01/16 5:56 PM

## 2016-04-30 ENCOUNTER — Inpatient Hospital Stay (HOSPITAL_BASED_OUTPATIENT_CLINIC_OR_DEPARTMENT_OTHER): Payer: BLUE CROSS/BLUE SHIELD | Admitting: Oncology

## 2016-04-30 ENCOUNTER — Inpatient Hospital Stay: Payer: BLUE CROSS/BLUE SHIELD

## 2016-04-30 ENCOUNTER — Encounter: Payer: Self-pay | Admitting: Oncology

## 2016-04-30 ENCOUNTER — Other Ambulatory Visit: Payer: Self-pay | Admitting: *Deleted

## 2016-04-30 ENCOUNTER — Ambulatory Visit
Admission: RE | Admit: 2016-04-30 | Discharge: 2016-04-30 | Disposition: A | Discharge status: Home / Self Care | Payer: BLUE CROSS/BLUE SHIELD | Source: Ambulatory Visit | Attending: Radiation Oncology | Admitting: Radiation Oncology

## 2016-04-30 VITALS — BP 105/68 | HR 75 | Wt 144.4 lb

## 2016-04-30 DIAGNOSIS — M542 Cervicalgia: Secondary | ICD-10-CM | POA: Diagnosis not present

## 2016-04-30 DIAGNOSIS — R630 Anorexia: Secondary | ICD-10-CM

## 2016-04-30 DIAGNOSIS — C099 Malignant neoplasm of tonsil, unspecified: Secondary | ICD-10-CM

## 2016-04-30 DIAGNOSIS — K5900 Constipation, unspecified: Secondary | ICD-10-CM | POA: Diagnosis not present

## 2016-04-30 DIAGNOSIS — R110 Nausea: Secondary | ICD-10-CM

## 2016-04-30 DIAGNOSIS — I10 Essential (primary) hypertension: Secondary | ICD-10-CM | POA: Diagnosis not present

## 2016-04-30 DIAGNOSIS — R531 Weakness: Secondary | ICD-10-CM

## 2016-04-30 DIAGNOSIS — R5383 Other fatigue: Secondary | ICD-10-CM | POA: Diagnosis not present

## 2016-04-30 DIAGNOSIS — G4700 Insomnia, unspecified: Secondary | ICD-10-CM

## 2016-04-30 DIAGNOSIS — M129 Arthropathy, unspecified: Secondary | ICD-10-CM

## 2016-04-30 DIAGNOSIS — Z79899 Other long term (current) drug therapy: Secondary | ICD-10-CM | POA: Diagnosis not present

## 2016-04-30 DIAGNOSIS — F419 Anxiety disorder, unspecified: Secondary | ICD-10-CM | POA: Diagnosis not present

## 2016-04-30 DIAGNOSIS — C090 Malignant neoplasm of tonsillar fossa: Secondary | ICD-10-CM | POA: Diagnosis not present

## 2016-04-30 LAB — BASIC METABOLIC PANEL
ANION GAP: 9 (ref 5–15)
BUN: 14 mg/dL (ref 6–20)
CALCIUM: 9 mg/dL (ref 8.9–10.3)
CO2: 27 mmol/L (ref 22–32)
Chloride: 92 mmol/L — ABNORMAL LOW (ref 101–111)
Creatinine, Ser: 0.84 mg/dL (ref 0.44–1.00)
Glucose, Bld: 108 mg/dL — ABNORMAL HIGH (ref 65–99)
Potassium: 3.5 mmol/L (ref 3.5–5.1)
Sodium: 128 mmol/L — ABNORMAL LOW (ref 135–145)

## 2016-04-30 LAB — CBC WITH DIFFERENTIAL/PLATELET
Basophils Absolute: 0 10*3/uL (ref 0–0.1)
Basophils Relative: 0 %
Eosinophils Absolute: 0.1 10*3/uL (ref 0–0.7)
Eosinophils Relative: 2 %
HCT: 26.2 % — ABNORMAL LOW (ref 35.0–47.0)
Hemoglobin: 9.6 g/dL — ABNORMAL LOW (ref 12.0–16.0)
Lymphocytes Relative: 10 %
Lymphs Abs: 0.3 10*3/uL — ABNORMAL LOW (ref 1.0–3.6)
MCH: 33.4 pg (ref 26.0–34.0)
MCHC: 36.6 g/dL — ABNORMAL HIGH (ref 32.0–36.0)
MCV: 91.2 fL (ref 80.0–100.0)
Monocytes Absolute: 0.5 10*3/uL (ref 0.2–0.9)
Monocytes Relative: 17 %
Neutro Abs: 2 10*3/uL (ref 1.4–6.5)
Neutrophils Relative %: 71 %
Platelets: 115 10*3/uL — ABNORMAL LOW (ref 150–440)
RBC: 2.88 MIL/uL — ABNORMAL LOW (ref 3.80–5.20)
RDW: 13.5 % (ref 11.5–14.5)
WBC: 2.7 10*3/uL — ABNORMAL LOW (ref 3.6–11.0)

## 2016-04-30 MED ORDER — POTASSIUM CHLORIDE 2 MEQ/ML IV SOLN
Freq: Once | INTRAVENOUS | Status: AC
  Administered 2016-04-30: 11:00:00 via INTRAVENOUS
  Filled 2016-04-30: qty 1000

## 2016-04-30 MED ORDER — SODIUM CHLORIDE 0.9 % IV SOLN
40.0000 mg/m2 | Freq: Once | INTRAVENOUS | Status: AC
  Administered 2016-04-30: 71 mg via INTRAVENOUS
  Filled 2016-04-30: qty 71

## 2016-04-30 MED ORDER — ONDANSETRON HCL 4 MG PO TABS
8.0000 mg | ORAL_TABLET | Freq: Once | ORAL | Status: AC
  Administered 2016-04-30: 8 mg via ORAL
  Filled 2016-04-30: qty 2

## 2016-04-30 MED ORDER — MAGIC MOUTHWASH W/LIDOCAINE: 5.0000 mL | Freq: Three times a day (TID) | ORAL | 0 refills | Status: DC | PRN

## 2016-04-30 MED ORDER — SODIUM CHLORIDE 0.9 % IV SOLN
Freq: Once | INTRAVENOUS | Status: AC
  Administered 2016-04-30: 10:00:00 via INTRAVENOUS
  Filled 2016-04-30: qty 1000

## 2016-04-30 MED ORDER — DEXAMETHASONE 4 MG PO TABS
8.0000 mg | ORAL_TABLET | Freq: Every day | ORAL | Status: DC
  Administered 2016-04-30: 8 mg via ORAL
  Filled 2016-04-30: qty 2

## 2016-04-30 MED ORDER — SILVER SULFADIAZINE 1 % EX CREA: 1.0000 "application " | TOPICAL_CREAM | Freq: Two times a day (BID) | CUTANEOUS | 2 refills | Status: DC

## 2016-04-30 MED ORDER — PROCHLORPERAZINE MALEATE 10 MG PO TABS: 10.0000 mg | ORAL_TABLET | Freq: Four times a day (QID) | ORAL | 1 refills | Status: DC | PRN

## 2016-05-01 ENCOUNTER — Telehealth: Payer: Self-pay | Admitting: *Deleted

## 2016-05-01 ENCOUNTER — Ambulatory Visit: Payer: BLUE CROSS/BLUE SHIELD

## 2016-05-01 MED ORDER — OXYCODONE-ACETAMINOPHEN 5-325 MG PO TABS: 1.0000 | ORAL_TABLET | ORAL | 0 refills | Status: DC | PRN

## 2016-05-01 MED ORDER — MAGIC MOUTHWASH W/LIDOCAINE: 5.0000 mL | Freq: Three times a day (TID) | ORAL | 0 refills | Status: DC | PRN

## 2016-05-01 NOTE — Telephone Encounter (Signed)
States MMW did not get to pharmacy yesterday and she needs a refill of Percocet offered by Dr Grayland Ormond to refill

## 2016-05-02 ENCOUNTER — Ambulatory Visit: Payer: BLUE CROSS/BLUE SHIELD

## 2016-05-03 ENCOUNTER — Ambulatory Visit: Payer: BLUE CROSS/BLUE SHIELD

## 2016-05-06 ENCOUNTER — Ambulatory Visit
Admission: RE | Admit: 2016-05-06 | Discharge: 2016-05-06 | Disposition: A | Discharge status: Home / Self Care | Payer: BLUE CROSS/BLUE SHIELD | Source: Ambulatory Visit | Attending: Radiation Oncology | Admitting: Radiation Oncology

## 2016-05-06 DIAGNOSIS — C090 Malignant neoplasm of tonsillar fossa: Secondary | ICD-10-CM | POA: Diagnosis not present

## 2016-05-06 NOTE — Progress Notes (Signed)
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  INTERVAL HISTORY: Patient returns to clinic today for further evaluation and consideration of cycle 7 of weekly cisplatin. She continues to tolerate her treatments without significant side effects, although she admits to more mouth and throat soreness this week. She also continues to have mild weakness and fatigue particularly towards the end of the day. She has no neurologic complaints. She denies any recent fevers. She denies any dysphasia or mouth sores. She has no chest pain or shortness of breath. She denies any vomiting or diarrhea. She has no urinary complaints. She denies hematuria or hematochezia. Patient otherwise feels well and offers no further specific complaints.  REVIEW OF SYSTEMS:   Review of Systems  Constitutional: Negative.  Negative for fever, malaise/fatigue and weight loss.  HENT: Negative for ear pain, hearing loss and sore throat.   Respiratory: Negative.  Negative for cough and shortness of breath.   Cardiovascular: Negative.  Negative for chest pain and leg swelling.  Gastrointestinal: Negative for abdominal pain, constipation and nausea.  Genitourinary: Negative.   Musculoskeletal: Negative.  Negative for neck pain.  Neurological: Negative for tingling, sensory change, weakness and headaches.  Psychiatric/Behavioral: The patient is nervous/anxious. The patient does not have insomnia.     As per HPI. Otherwise, a complete review of systems is negative.  PAST MEDICAL HISTORY: Past Medical History:  Diagnosis Date  . Arthritis   . Cancer ([place]) 03/13/2016   Malignant neoplasm of tonsil  . Hypertension   . Postmenopausal     PAST SURGICAL HISTORY: No past  surgical history on file.  FAMILY HISTORY: Negative and noncontributory. No reported history of malignancy or chronic disease.  ADVANCED DIRECTIVES (Y/N):  N  HEALTH MAINTENANCE: Social History  Substance Use Topics  . Smoking status: Never Smoker  . Smokeless tobacco: Never Used  . Alcohol use Not on file     Colonoscopy:  PAP:  Bone density:  Lipid panel:  No Known Allergies  Current Outpatient Prescriptions  Medication Sig Dispense Refill  . alendronate (FOSAMAX) 70 MG tablet Take 70 mg by mouth once a week. Take with a full glass of water on an empty stomach.    Marland Kitchen amLODipine (NORVASC) 10 MG tablet Take 10 mg by mouth daily.    . Cholecalciferol (VITAMIN D) 2000 units tablet Take 2,000 Units by mouth daily.    . clobetasol cream (TEMOVATE) AB-123456789 % Apply 1 application topically 4 (four) times a week.    . co-enzyme Q-10 30 MG capsule Take 100 mg by mouth daily.    . finasteride (PROSCAR) 5 MG tablet Take 5 mg by mouth daily.    . hydrochlorothiazide (HYDRODIURIL) 25 MG tablet Take 25 mg by mouth daily.    Marland Kitchen lidocaine-prilocaine (EMLA) cream Apply to affected area once 30 g 3  . lisinopril (PRINIVIL,ZESTRIL) 40 MG tablet Take 40 mg by mouth daily.    . magic mouthwash w/lidocaine SOLN Take 5 mLs by mouth 3 (three) times daily as needed for mouth pain. 300 mL 0  . magic mouthwash w/lidocaine SOLN Take 5 mLs by mouth 3 (three) times daily as needed.  0  . niacin (NIASPAN) 500 MG CR tablet Take 500 mg by mouth at bedtime.    . Omega-3 Fatty Acids (  FISH OIL OMEGA-3 PO) Take 1 capsule by mouth 4 (four) times a week.    . ondansetron (ZOFRAN) 8 MG tablet Take 1 tablet (8 mg total) by mouth 2 (two) times daily as needed. 30 tablet 1  . oxyCODONE-acetaminophen (PERCOCET/ROXICET) 5-325 MG tablet Take 1 tablet by mouth every 4 (four) hours as needed for severe pain. 120 tablet 0  . Pioglitazone HCl (ACTOS PO) Take 7.5 mg by mouth daily.    . pravastatin (PRAVACHOL) 40 MG tablet Take 40  mg by mouth daily.    . prochlorperazine (COMPAZINE) 10 MG tablet Take 1 tablet (10 mg total) by mouth every 6 (six) hours as needed (Nausea or vomiting). 60 tablet 1  . silver sulfADIAZINE (SILVADENE) 1 % cream Apply 1 application topically 2 (two) times daily. 50 g 2  . sucralfate (CARAFATE) 1 g tablet Take 1 tablet (1 g total) by mouth 4 (four) times daily. 120 tablet 3  . zolpidem (AMBIEN) 5 MG tablet Take 1 tablet (5 mg total) by mouth at bedtime as needed for sleep. 30 tablet 0   No current facility-administered medications for this visit.     OBJECTIVE: Vitals:   05/07/16 0916  BP: 105/65  Pulse: 89  Resp: 18  Temp: 97.3 F (36.3 C)     Body mass index is 24.18 kg/m.    ECOG FS:0 - Asymptomatic  General: Well-developed, well-nourished, no acute distress. Eyes: Pink conjunctiva; reddened, anicteric sclera. HEENT: Normocephalic, moist mucous membranes, mass seen on left tonsillar area. No palpable neck lymphadenopathy.  Lungs: Clear to auscultation bilaterally. Heart: Regular rate and rhythm. No rubs, murmurs, or gallops. Abdomen: Soft, nontender, nondistended. No organomegaly noted, normoactive bowel sounds. Musculoskeletal: No edema, cyanosis, or clubbing. Neuro: Alert, answering all questions appropriately. Cranial nerves grossly intact. Skin: No rashes or petechiae noted. Psych: Normal affect. Lymphatics: No calvicular, axillary or inguinal LAD.   LAB RESULTS:  Lab Results  Component Value Date   NA 130 (L) 05/07/2016   K 3.5 05/07/2016   CL 94 (L) 05/07/2016   CO2 29 05/07/2016   GLUCOSE 101 (H) 05/07/2016   BUN 13 05/07/2016   CREATININE 0.82 05/07/2016   CALCIUM 8.8 (L) 05/07/2016   GFRNONAA >60 05/07/2016   GFRAA >60 05/07/2016    Lab Results  Component Value Date   WBC 1.2 (LL) 05/07/2016   NEUTROABS 0.8 (L) 05/07/2016   HGB 8.5 (L) 05/07/2016   HCT 23.5 (L) 05/07/2016   MCV 91.9 05/07/2016   PLT 90 (L) 05/07/2016     STUDIES: No results  found.  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 PLAN:    1. 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 PET scan results reviewed independently. Given the clinical stage of her disease, she will benefit from concurrent XRT and chemotherapy using weekly cisplatin. Induction chemotherapy was not recommended. Delay cycle 7 of weekly cisplatin today secondary to neutropenia and thrombocytopenia. Continue daily XRT which will be completed on May 23, 2016.  Return to clinic in 1 week for further evaluation and reconsideration of cycle 7. 2. Ear pain: Resolved.  Likely secondary to malignancy.  3. Constipation: Continue OTC treatments as needed.  4. Nausea: Mild, continue prescribed medications as needed.  Monitor. 5. Mouth soreness: Continue Magic mouthwash and Percocet as needed. 6. Neutropenia: Delay treatment as above. 7. Thrombocytopenia: Delay treatment as above.   Patient expressed understanding and was in agreement with this plan. She also understands that  She can call clinic at any time with any questions, concerns, or complaints.    Lloyd Huger, MD 05/10/16 11:31 AM

## 2016-05-07 ENCOUNTER — Inpatient Hospital Stay (HOSPITAL_BASED_OUTPATIENT_CLINIC_OR_DEPARTMENT_OTHER): Payer: BLUE CROSS/BLUE SHIELD | Admitting: Oncology

## 2016-05-07 ENCOUNTER — Inpatient Hospital Stay: Payer: BLUE CROSS/BLUE SHIELD

## 2016-05-07 ENCOUNTER — Ambulatory Visit
Admission: RE | Admit: 2016-05-07 | Discharge: 2016-05-07 | Disposition: A | Discharge status: Home / Self Care | Payer: BLUE CROSS/BLUE SHIELD | Source: Ambulatory Visit | Attending: Radiation Oncology | Admitting: Radiation Oncology

## 2016-05-07 VITALS — BP 105/65 | HR 89 | Temp 97.3°F | Resp 18 | Wt 145.3 lb

## 2016-05-07 DIAGNOSIS — G4700 Insomnia, unspecified: Secondary | ICD-10-CM

## 2016-05-07 DIAGNOSIS — C099 Malignant neoplasm of tonsil, unspecified: Secondary | ICD-10-CM

## 2016-05-07 DIAGNOSIS — R630 Anorexia: Secondary | ICD-10-CM

## 2016-05-07 DIAGNOSIS — R110 Nausea: Secondary | ICD-10-CM

## 2016-05-07 DIAGNOSIS — M129 Arthropathy, unspecified: Secondary | ICD-10-CM

## 2016-05-07 DIAGNOSIS — R531 Weakness: Secondary | ICD-10-CM

## 2016-05-07 DIAGNOSIS — D709 Neutropenia, unspecified: Secondary | ICD-10-CM

## 2016-05-07 DIAGNOSIS — I10 Essential (primary) hypertension: Secondary | ICD-10-CM

## 2016-05-07 DIAGNOSIS — R5383 Other fatigue: Secondary | ICD-10-CM

## 2016-05-07 DIAGNOSIS — D696 Thrombocytopenia, unspecified: Secondary | ICD-10-CM | POA: Diagnosis not present

## 2016-05-07 DIAGNOSIS — K5900 Constipation, unspecified: Secondary | ICD-10-CM

## 2016-05-07 DIAGNOSIS — F419 Anxiety disorder, unspecified: Secondary | ICD-10-CM

## 2016-05-07 DIAGNOSIS — Z79899 Other long term (current) drug therapy: Secondary | ICD-10-CM

## 2016-05-07 DIAGNOSIS — M542 Cervicalgia: Secondary | ICD-10-CM

## 2016-05-07 DIAGNOSIS — C090 Malignant neoplasm of tonsillar fossa: Secondary | ICD-10-CM | POA: Diagnosis not present

## 2016-05-07 LAB — BASIC METABOLIC PANEL
ANION GAP: 7 (ref 5–15)
BUN: 13 mg/dL (ref 6–20)
CALCIUM: 8.8 mg/dL — AB (ref 8.9–10.3)
CHLORIDE: 94 mmol/L — AB (ref 101–111)
CO2: 29 mmol/L (ref 22–32)
CREATININE: 0.82 mg/dL (ref 0.44–1.00)
GFR calc non Af Amer: >60 mL/min (ref >60)
Glucose, Bld: 101 mg/dL — ABNORMAL HIGH (ref 65–99)
Potassium: 3.5 mmol/L (ref 3.5–5.1)
SODIUM: 130 mmol/L — AB (ref 135–145)

## 2016-05-07 LAB — CBC WITH DIFFERENTIAL/PLATELET
BASOS ABS: 0 10*3/uL (ref 0–0.1)
BASOS PCT: 0 %
EOS ABS: 0 10*3/uL (ref 0–0.7)
Eosinophils Relative: 2 %
HCT: 23.5 % — ABNORMAL LOW (ref 35.0–47.0)
Hemoglobin: 8.5 g/dL — ABNORMAL LOW (ref 12.0–16.0)
Lymphocytes Relative: 16 %
Lymphs Abs: 0.2 10*3/uL — ABNORMAL LOW (ref 1.0–3.6)
MCH: 33.1 pg (ref 26.0–34.0)
MCHC: 36 g/dL (ref 32.0–36.0)
MCV: 91.9 fL (ref 80.0–100.0)
Monocytes Absolute: 0.3 10*3/uL (ref 0.2–0.9)
Monocytes Relative: 20 %
NEUTROS PCT: 62 %
Neutro Abs: 0.8 10*3/uL — ABNORMAL LOW (ref 1.4–6.5)
Platelets: 90 10*3/uL — ABNORMAL LOW (ref 150–440)
RBC: 2.56 MIL/uL — ABNORMAL LOW (ref 3.80–5.20)
RDW: 14.4 % (ref 11.5–14.5)
WBC: 1.2 10*3/uL — AB (ref 3.6–11.0)

## 2016-05-07 MED ORDER — OXYCODONE-ACETAMINOPHEN 5-325 MG PO TABS: 1.0000 | ORAL_TABLET | ORAL | 0 refills | Status: DC | PRN

## 2016-05-07 NOTE — Progress Notes (Signed)
Patient states she threw up this morning.  Has had problems swallowing.  Throat is sore from radiation.  Eating soft diet.  States she had low BP over the weekend.  Okay today 105/65 Hr 89.  Patient requesting refill for Oxycodone. Asking for 3 weeks worth.

## 2016-05-08 ENCOUNTER — Ambulatory Visit
Admission: RE | Admit: 2016-05-08 | Discharge: 2016-05-08 | Disposition: A | Discharge status: Home / Self Care | Payer: BLUE CROSS/BLUE SHIELD | Source: Ambulatory Visit | Attending: Radiation Oncology | Admitting: Radiation Oncology

## 2016-05-08 DIAGNOSIS — C090 Malignant neoplasm of tonsillar fossa: Secondary | ICD-10-CM | POA: Diagnosis not present

## 2016-05-09 ENCOUNTER — Ambulatory Visit
Admission: RE | Admit: 2016-05-09 | Discharge: 2016-05-09 | Disposition: A | Discharge status: Home / Self Care | Payer: BLUE CROSS/BLUE SHIELD | Source: Ambulatory Visit | Attending: Radiation Oncology | Admitting: Radiation Oncology

## 2016-05-09 DIAGNOSIS — C090 Malignant neoplasm of tonsillar fossa: Secondary | ICD-10-CM | POA: Diagnosis not present

## 2016-05-10 ENCOUNTER — Ambulatory Visit
Admission: RE | Admit: 2016-05-10 | Discharge: 2016-05-10 | Disposition: A | Discharge status: Home / Self Care | Payer: BLUE CROSS/BLUE SHIELD | Source: Ambulatory Visit | Attending: Radiation Oncology | Admitting: Radiation Oncology

## 2016-05-10 DIAGNOSIS — C090 Malignant neoplasm of tonsillar fossa: Secondary | ICD-10-CM | POA: Diagnosis not present

## 2016-05-13 ENCOUNTER — Ambulatory Visit: Payer: BLUE CROSS/BLUE SHIELD

## 2016-05-13 NOTE — Progress Notes (Signed)
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  INTERVAL HISTORY: Patient returns to clinic today for further evaluation and reconsideration of cycle 7 of weekly cisplatin. She has noticed increased weakness and fatigue over the past week. She also has worsening mouth pain and soreness. She has increased nausea and a poor appetite. She has no neurologic complaints. She denies any recent fevers. She has no chest pain or shortness of breath. She denies any vomiting or diarrhea. She has no urinary complaints. She denies hematuria or hematochezia. Patient otherwise feels well and offers no further specific complaints.  REVIEW OF SYSTEMS:   Review of Systems  Constitutional: Positive for malaise/fatigue and weight loss. Negative for fever.  HENT: Negative for ear pain, hearing loss and sore throat.   Respiratory: Negative.  Negative for cough and shortness of breath.   Cardiovascular: Negative.  Negative for chest pain and leg swelling.  Gastrointestinal: Positive for nausea. Negative for abdominal pain and constipation.  Genitourinary: Negative.   Musculoskeletal: Negative.  Negative for neck pain.  Neurological: Positive for weakness. Negative for tingling, sensory change and headaches.  Psychiatric/Behavioral: The patient is nervous/anxious. The patient does not have insomnia.     As per HPI. Otherwise, a complete review of systems is negative.  PAST MEDICAL HISTORY: Past Medical History:  Diagnosis Date  . Arthritis   . Cancer ([place]) 03/13/2016   Malignant neoplasm of tonsil  . Hypertension   . Postmenopausal     PAST SURGICAL HISTORY: History reviewed. No pertinent surgical history.  FAMILY HISTORY: Negative and  noncontributory. No reported history of malignancy or chronic disease.  ADVANCED DIRECTIVES (Y/N):  N  HEALTH MAINTENANCE: Social History  Substance Use Topics  . Smoking status: Never Smoker  . Smokeless tobacco: Never Used  . Alcohol use Not on file     Colonoscopy:  PAP:  Bone density:  Lipid panel:  No Known Allergies  Current Outpatient Prescriptions  Medication Sig Dispense Refill  . alendronate (FOSAMAX) 70 MG tablet Take 70 mg by mouth once a week. Take with a full glass of water on an empty stomach.    Marland Kitchen amLODipine (NORVASC) 10 MG tablet Take 10 mg by mouth daily.    . Cholecalciferol (VITAMIN D) 2000 units tablet Take 2,000 Units by mouth daily.    . clobetasol cream (TEMOVATE) AB-123456789 % Apply 1 application topically 4 (four) times a week.    . co-enzyme Q-10 30 MG capsule Take 100 mg by mouth daily.    . finasteride (PROSCAR) 5 MG tablet Take 5 mg by mouth daily.    . hydrochlorothiazide (HYDRODIURIL) 25 MG tablet Take 25 mg by mouth daily.    Marland Kitchen lidocaine-prilocaine (EMLA) cream Apply to affected area once 30 g 3  . lisinopril (PRINIVIL,ZESTRIL) 40 MG tablet Take 40 mg by mouth daily.    . magic mouthwash w/lidocaine SOLN Take 5 mLs by mouth 3 (three) times daily as needed for mouth pain. 300 mL 0  . magic mouthwash w/lidocaine SOLN Take 5 mLs by mouth 3 (three) times daily as needed.  0  . niacin (NIASPAN) 500 MG CR tablet Take 500 mg by mouth at bedtime.    . Omega-3 Fatty Acids (FISH OIL OMEGA-3 PO) Take 1 capsule by mouth 4 (four) times a  week.    . ondansetron (ZOFRAN) 8 MG tablet Take 1 tablet (8 mg total) by mouth 2 (two) times daily as needed. 30 tablet 1  . oxyCODONE-acetaminophen (PERCOCET/ROXICET) 5-325 MG tablet Take 1 tablet by mouth every 4 (four) hours as needed for severe pain. 120 tablet 0  . Pioglitazone HCl (ACTOS PO) Take 7.5 mg by mouth daily.    . pravastatin (PRAVACHOL) 40 MG tablet Take 40 mg by mouth daily.    . prochlorperazine (COMPAZINE) 10 MG  tablet Take 1 tablet (10 mg total) by mouth every 6 (six) hours as needed (Nausea or vomiting). 60 tablet 1  . silver sulfADIAZINE (SILVADENE) 1 % cream Apply 1 application topically 2 (two) times daily. 50 g 2  . sucralfate (CARAFATE) 1 g tablet Take 1 tablet (1 g total) by mouth 4 (four) times daily. 120 tablet 3  . zolpidem (AMBIEN) 5 MG tablet Take 1 tablet (5 mg total) by mouth at bedtime as needed for sleep. 30 tablet 0   No current facility-administered medications for this visit.     OBJECTIVE: Vitals:   05/14/16 1025  BP: 119/60  Pulse: 81  Temp: 97.3 F (36.3 C)     Body mass index is 24.4 kg/m.    ECOG FS:0 - Asymptomatic  General: Well-developed, well-nourished, no acute distress. Eyes: Pink conjunctiva; reddened, anicteric sclera. HEENT: Normocephalic, moist mucous membranes, mass seen on left tonsillar area. No palpable neck lymphadenopathy.  Lungs: Clear to auscultation bilaterally. Heart: Regular rate and rhythm. No rubs, murmurs, or gallops. Abdomen: Soft, nontender, nondistended. No organomegaly noted, normoactive bowel sounds. Musculoskeletal: No edema, cyanosis, or clubbing. Neuro: Alert, answering all questions appropriately. Cranial nerves grossly intact. Skin: No rashes or petechiae noted. Psych: Normal affect. Lymphatics: No calvicular, axillary or inguinal LAD.   LAB RESULTS:  Lab Results  Component Value Date   NA 125 (L) 05/14/2016   K 4.0 05/14/2016   CL 91 (L) 05/14/2016   CO2 27 05/14/2016   GLUCOSE 110 (H) 05/14/2016   BUN 14 05/14/2016   CREATININE 0.76 05/14/2016   CALCIUM 8.3 (L) 05/14/2016   GFRNONAA >60 05/14/2016   GFRAA >60 05/14/2016    Lab Results  Component Value Date   WBC 1.9 (L) 05/14/2016   NEUTROABS 1.2 (L) 05/14/2016   HGB 7.0 (L) 05/14/2016   HCT 18.9 (L) 05/14/2016   MCV 92.7 05/14/2016   PLT 99 (L) 05/14/2016     STUDIES: No results found.  ASSESSMENT: Clinical stage IVa (T2,N2c,M0) left tonsillar squamous  cell carcinoma. PLAN:    1. 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 PET scan results reviewed independently. Given the clinical stage of her disease, she will benefit from concurrent XRT and chemotherapy using weekly cisplatin. Induction chemotherapy was not recommended. Delay cycle 7 of weekly cisplatin today again secondary to declining performance status, mild neutropenia and thrombocytopenia. Continue daily XRT which will be completed on May 24, 2016.  Return to clinic in 1 week for further evaluation and reconsideration of cycle 7, although likely will discontinue treatment altogether at this time. 2. Ear pain: Resolved.  Likely secondary to malignancy.  3. Constipation: Continue OTC treatments as needed.  4. Nausea: Mild, continue prescribed medications as needed.  Monitor. 5. Mouth soreness: Continue Magic mouthwash and Percocet as needed. 6. Neutropenia: Delay treatment as above. 7. Thrombocytopenia: Delay treatment as above. 8. Hyponatremia: Likely secondary to dehydration. Patient declined IV fluids today. Delay treatment as above.   Patient  expressed understanding and was in agreement with this plan. She also understands that She can call clinic at any time with any questions, concerns, or complaints.    Lloyd Huger, MD 05/18/16 6:28 PM

## 2016-05-14 ENCOUNTER — Inpatient Hospital Stay: Payer: BLUE CROSS/BLUE SHIELD | Attending: Oncology | Admitting: Oncology

## 2016-05-14 ENCOUNTER — Inpatient Hospital Stay: Payer: BLUE CROSS/BLUE SHIELD

## 2016-05-14 ENCOUNTER — Encounter: Payer: Self-pay | Admitting: Oncology

## 2016-05-14 ENCOUNTER — Ambulatory Visit
Admission: RE | Admit: 2016-05-14 | Discharge: 2016-05-14 | Disposition: A | Discharge status: Home / Self Care | Payer: BLUE CROSS/BLUE SHIELD | Source: Ambulatory Visit | Attending: Radiation Oncology | Admitting: Radiation Oncology

## 2016-05-14 VITALS — BP 119/60 | HR 81 | Temp 97.3°F | Wt 146.6 lb

## 2016-05-14 DIAGNOSIS — Z923 Personal history of irradiation: Secondary | ICD-10-CM | POA: Insufficient documentation

## 2016-05-14 DIAGNOSIS — R630 Anorexia: Secondary | ICD-10-CM | POA: Diagnosis not present

## 2016-05-14 DIAGNOSIS — R110 Nausea: Secondary | ICD-10-CM | POA: Diagnosis not present

## 2016-05-14 DIAGNOSIS — E860 Dehydration: Secondary | ICD-10-CM | POA: Insufficient documentation

## 2016-05-14 DIAGNOSIS — R5383 Other fatigue: Secondary | ICD-10-CM

## 2016-05-14 DIAGNOSIS — C099 Malignant neoplasm of tonsil, unspecified: Secondary | ICD-10-CM

## 2016-05-14 DIAGNOSIS — M189 Osteoarthritis of first carpometacarpal joint, unspecified: Secondary | ICD-10-CM

## 2016-05-14 DIAGNOSIS — K5900 Constipation, unspecified: Secondary | ICD-10-CM | POA: Diagnosis not present

## 2016-05-14 DIAGNOSIS — R531 Weakness: Secondary | ICD-10-CM | POA: Diagnosis not present

## 2016-05-14 DIAGNOSIS — M129 Arthropathy, unspecified: Secondary | ICD-10-CM | POA: Diagnosis not present

## 2016-05-14 DIAGNOSIS — K1379 Other lesions of oral mucosa: Secondary | ICD-10-CM | POA: Diagnosis not present

## 2016-05-14 DIAGNOSIS — Z9221 Personal history of antineoplastic chemotherapy: Secondary | ICD-10-CM | POA: Diagnosis not present

## 2016-05-14 DIAGNOSIS — I10 Essential (primary) hypertension: Secondary | ICD-10-CM | POA: Insufficient documentation

## 2016-05-14 DIAGNOSIS — D709 Neutropenia, unspecified: Secondary | ICD-10-CM

## 2016-05-14 DIAGNOSIS — Z79899 Other long term (current) drug therapy: Secondary | ICD-10-CM

## 2016-05-14 DIAGNOSIS — E871 Hypo-osmolality and hyponatremia: Secondary | ICD-10-CM | POA: Diagnosis not present

## 2016-05-14 DIAGNOSIS — D696 Thrombocytopenia, unspecified: Secondary | ICD-10-CM | POA: Diagnosis not present

## 2016-05-14 DIAGNOSIS — C090 Malignant neoplasm of tonsillar fossa: Secondary | ICD-10-CM | POA: Diagnosis not present

## 2016-05-14 LAB — BASIC METABOLIC PANEL
Anion gap: 7 (ref 5–15)
BUN: 14 mg/dL (ref 6–20)
CALCIUM: 8.3 mg/dL — AB (ref 8.9–10.3)
CO2: 27 mmol/L (ref 22–32)
Chloride: 91 mmol/L — ABNORMAL LOW (ref 101–111)
Creatinine, Ser: 0.76 mg/dL (ref 0.44–1.00)
GFR calc Af Amer: >60 mL/min (ref >60)
GFR calc non Af Amer: >60 mL/min (ref >60)
GLUCOSE: 110 mg/dL — AB (ref 65–99)
POTASSIUM: 4 mmol/L (ref 3.5–5.1)
Sodium: 125 mmol/L — ABNORMAL LOW (ref 135–145)

## 2016-05-14 LAB — CBC WITH DIFFERENTIAL/PLATELET
BASOS ABS: 0 10*3/uL (ref 0–0.1)
Basophils Relative: 0 %
Eosinophils Absolute: 0 10*3/uL (ref 0–0.7)
Eosinophils Relative: 1 %
HEMATOCRIT: 18.9 % — AB (ref 35.0–47.0)
Hemoglobin: 7 g/dL — ABNORMAL LOW (ref 12.0–16.0)
LYMPHS ABS: 0.2 10*3/uL — AB (ref 1.0–3.6)
LYMPHS PCT: 9 %
MCH: 34.2 pg — AB (ref 26.0–34.0)
MCHC: 36.9 g/dL — ABNORMAL HIGH (ref 32.0–36.0)
MCV: 92.7 fL (ref 80.0–100.0)
MONO ABS: 0.5 10*3/uL (ref 0.2–0.9)
Monocytes Relative: 27 %
NEUTROS ABS: 1.2 10*3/uL — AB (ref 1.4–6.5)
Neutrophils Relative %: 63 %
Platelets: 99 10*3/uL — ABNORMAL LOW (ref 150–440)
RBC: 2.04 MIL/uL — ABNORMAL LOW (ref 3.80–5.20)
RDW: 18.8 % — AB (ref 11.5–14.5)
WBC: 1.9 10*3/uL — ABNORMAL LOW (ref 3.6–11.0)

## 2016-05-15 ENCOUNTER — Ambulatory Visit
Admission: RE | Admit: 2016-05-15 | Discharge: 2016-05-15 | Disposition: A | Discharge status: Home / Self Care | Payer: BLUE CROSS/BLUE SHIELD | Source: Ambulatory Visit | Attending: Radiation Oncology | Admitting: Radiation Oncology

## 2016-05-15 DIAGNOSIS — C090 Malignant neoplasm of tonsillar fossa: Secondary | ICD-10-CM | POA: Diagnosis not present

## 2016-05-16 ENCOUNTER — Ambulatory Visit
Admission: RE | Admit: 2016-05-16 | Discharge: 2016-05-16 | Disposition: A | Discharge status: Home / Self Care | Payer: BLUE CROSS/BLUE SHIELD | Source: Ambulatory Visit | Attending: Radiation Oncology | Admitting: Radiation Oncology

## 2016-05-16 DIAGNOSIS — C090 Malignant neoplasm of tonsillar fossa: Secondary | ICD-10-CM | POA: Diagnosis not present

## 2016-05-17 ENCOUNTER — Ambulatory Visit
Admission: RE | Admit: 2016-05-17 | Discharge: 2016-05-17 | Disposition: A | Discharge status: Home / Self Care | Payer: BLUE CROSS/BLUE SHIELD | Source: Ambulatory Visit | Attending: Radiation Oncology | Admitting: Radiation Oncology

## 2016-05-17 DIAGNOSIS — C090 Malignant neoplasm of tonsillar fossa: Secondary | ICD-10-CM | POA: Diagnosis not present

## 2016-05-19 NOTE — Progress Notes (Signed)
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  INTERVAL HISTORY: Patient returns to clinic today for further evaluation and discussion of discontinuing treatment. She continues to have increased weakness and fatigue which is unchanged. Her mouth pain and soreness is unchanged as well. She has increased nausea and a poor appetite. She has no neurologic complaints. She denies any recent fevers. She has no chest pain or shortness of breath. She denies any vomiting or diarrhea. She has no urinary complaints. She denies hematuria or hematochezia. Patient otherwise feels well and offers no further specific complaints.  REVIEW OF SYSTEMS:   Review of Systems  Constitutional: Positive for malaise/fatigue and weight loss. Negative for fever.  HENT: Negative for ear pain, hearing loss and sore throat.   Respiratory: Negative.  Negative for cough and shortness of breath.   Cardiovascular: Negative.  Negative for chest pain and leg swelling.  Gastrointestinal: Positive for nausea. Negative for abdominal pain and constipation.  Genitourinary: Negative.   Musculoskeletal: Negative.  Negative for neck pain.  Neurological: Positive for weakness. Negative for tingling, sensory change and headaches.  Psychiatric/Behavioral: The patient is nervous/anxious. The patient does not have insomnia.     As per HPI. Otherwise, a complete review of systems is negative.  PAST MEDICAL HISTORY: Past Medical History:  Diagnosis Date  . Arthritis   . Cancer ([place]) 03/13/2016   Malignant neoplasm of tonsil  . Hypertension   . Postmenopausal     PAST SURGICAL HISTORY: No past surgical history on file.  FAMILY HISTORY: Negative and noncontributory. No reported  history of malignancy or chronic disease.  ADVANCED DIRECTIVES (Y/N):  N  HEALTH MAINTENANCE: Social History  Substance Use Topics  . Smoking status: Never Smoker  . Smokeless tobacco: Never Used  . Alcohol use Not on file     Colonoscopy:  PAP:  Bone density:  Lipid panel:  No Known Allergies  Current Outpatient Prescriptions  Medication Sig Dispense Refill  . alendronate (FOSAMAX) 70 MG tablet Take 70 mg by mouth once a week. Take with a full glass of water on an empty stomach.    Marland Kitchen amLODipine (NORVASC) 10 MG tablet Take 10 mg by mouth daily.    . Cholecalciferol (VITAMIN D) 2000 units tablet Take 2,000 Units by mouth daily.    . clobetasol cream (TEMOVATE) 4.16 % Apply 1 application topically 4 (four) times a week.    . co-enzyme Q-10 30 MG capsule Take 100 mg by mouth daily.    . finasteride (PROSCAR) 5 MG tablet Take 5 mg by mouth daily.    . hydrochlorothiazide (HYDRODIURIL) 25 MG tablet Take 25 mg by mouth daily.    Marland Kitchen lisinopril (PRINIVIL,ZESTRIL) 40 MG tablet Take 40 mg by mouth daily.    . magic mouthwash w/lidocaine SOLN Take 5 mLs by mouth 3 (three) times daily as needed for mouth pain. 300 mL 0  . magic mouthwash w/lidocaine SOLN Take 5 mLs by mouth 3 (three) times daily as needed.  0  . niacin (NIASPAN) 500 MG CR tablet Take 500 mg by mouth at bedtime.    . Omega-3 Fatty Acids (FISH OIL OMEGA-3 PO) Take 1 capsule by mouth 4 (four) times a week.    . Pioglitazone HCl (ACTOS PO) Take 7.5 mg by mouth daily.    Marland Kitchen  pravastatin (PRAVACHOL) 40 MG tablet Take 40 mg by mouth daily.    . prochlorperazine (COMPAZINE) 10 MG tablet Take 1 tablet (10 mg total) by mouth every 6 (six) hours as needed (Nausea or vomiting). 60 tablet 1  . silver sulfADIAZINE (SILVADENE) 1 % cream Apply 1 application topically 2 (two) times daily. 50 g 2  . sucralfate (CARAFATE) 1 g tablet Take 1 tablet (1 g total) by mouth 4 (four) times daily. 120 tablet 3  . zolpidem (AMBIEN) 5 MG tablet Take 1  tablet (5 mg total) by mouth at bedtime as needed for sleep. 30 tablet 0  . oxyCODONE-acetaminophen (PERCOCET/ROXICET) 5-325 MG tablet Take 1 tablet by mouth every 4 (four) hours as needed for severe pain. (Patient not taking: Reported on 05/21/2016) 120 tablet 0   No current facility-administered medications for this visit.     OBJECTIVE: Vitals:   05/21/16 1123  BP: 129/80  Pulse: 88  Resp: 18  Temp: 98.9 F (37.2 C)     Body mass index is 24.74 kg/m.    ECOG FS:0 - Asymptomatic  General: Well-developed, well-nourished, no acute distress. Eyes: Pink conjunctiva; reddened, anicteric sclera. HEENT: Normocephalic, moist mucous membranes, mass seen on left tonsillar area. No palpable neck lymphadenopathy.  Lungs: Clear to auscultation bilaterally. Heart: Regular rate and rhythm. No rubs, murmurs, or gallops. Abdomen: Soft, nontender, nondistended. No organomegaly noted, normoactive bowel sounds. Musculoskeletal: No edema, cyanosis, or clubbing. Neuro: Alert, answering all questions appropriately. Cranial nerves grossly intact. Skin: No rashes or petechiae noted. Psych: Normal affect. Lymphatics: No calvicular, axillary or inguinal LAD.   LAB RESULTS:  Lab Results  Component Value Date   NA 124 (L) 05/21/2016   K 3.6 05/21/2016   CL 91 (L) 05/21/2016   CO2 25 05/21/2016   GLUCOSE 107 (H) 05/21/2016   BUN 8 05/21/2016   CREATININE 0.65 05/21/2016   CALCIUM 8.5 (L) 05/21/2016   GFRNONAA >60 05/21/2016   GFRAA >60 05/21/2016    Lab Results  Component Value Date   WBC 7.0 05/21/2016   NEUTROABS 5.9 05/21/2016   HGB 7.5 (L) 05/21/2016   HCT 20.7 (L) 05/21/2016   MCV 96.4 05/21/2016   PLT 203 05/21/2016     STUDIES: No results found.  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  PLAN:    1. 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 PET scan results reviewed independently. Given the  clinical stage of her disease, she will benefit from concurrent XRT and chemotherapy using weekly cisplatin. Induction chemotherapy was not recommended. After discussion with the patient, given her declining performance status will discontinue chemotherapy altogether. She has been instructed to continue her daily XRT which will be completed on May 24, 2016.  No further treatments are planned at this time. Will reimage with PET scan in 8 weeks and then patient will follow-up 1-2 days later for further evaluation and discussion of the results. 2. Ear pain: Resolved.  Likely secondary to malignancy.  3. Constipation: Continue OTC treatments as needed.  4. Nausea:Continue prescribed medications as needed.  Monitor. 5. Mouth soreness: Continue Magic mouthwash and Percocet as needed. 6. Hyponatremia: Likely secondary to dehydration. Patient declined IV fluids today. Delay treatment as above.   Patient expressed understanding and was in agreement with this plan. She also understands that She can call clinic at any time with any questions, concerns, or complaints.    Lloyd Huger, MD 05/23/16 9:05 AM

## 2016-05-20 ENCOUNTER — Ambulatory Visit
Admission: RE | Admit: 2016-05-20 | Discharge: 2016-05-20 | Disposition: A | Discharge status: Home / Self Care | Payer: BLUE CROSS/BLUE SHIELD | Source: Ambulatory Visit | Attending: Radiation Oncology | Admitting: Radiation Oncology

## 2016-05-20 ENCOUNTER — Ambulatory Visit: Payer: BLUE CROSS/BLUE SHIELD

## 2016-05-20 DIAGNOSIS — C090 Malignant neoplasm of tonsillar fossa: Secondary | ICD-10-CM | POA: Diagnosis not present

## 2016-05-21 ENCOUNTER — Other Ambulatory Visit: Payer: Self-pay | Admitting: Oncology

## 2016-05-21 ENCOUNTER — Inpatient Hospital Stay: Payer: BLUE CROSS/BLUE SHIELD

## 2016-05-21 ENCOUNTER — Inpatient Hospital Stay (HOSPITAL_BASED_OUTPATIENT_CLINIC_OR_DEPARTMENT_OTHER): Payer: BLUE CROSS/BLUE SHIELD | Admitting: Oncology

## 2016-05-21 ENCOUNTER — Ambulatory Visit
Admission: RE | Admit: 2016-05-21 | Discharge: 2016-05-21 | Disposition: A | Discharge status: Home / Self Care | Payer: BLUE CROSS/BLUE SHIELD | Source: Ambulatory Visit | Attending: Radiation Oncology | Admitting: Radiation Oncology

## 2016-05-21 VITALS — BP 129/80 | HR 88 | Temp 98.9°F | Resp 18 | Wt 148.7 lb

## 2016-05-21 DIAGNOSIS — R531 Weakness: Secondary | ICD-10-CM

## 2016-05-21 DIAGNOSIS — E871 Hypo-osmolality and hyponatremia: Secondary | ICD-10-CM

## 2016-05-21 DIAGNOSIS — R5383 Other fatigue: Secondary | ICD-10-CM

## 2016-05-21 DIAGNOSIS — C099 Malignant neoplasm of tonsil, unspecified: Secondary | ICD-10-CM

## 2016-05-21 DIAGNOSIS — E709 Disorder of aromatic amino-acid metabolism, unspecified: Secondary | ICD-10-CM | POA: Diagnosis not present

## 2016-05-21 DIAGNOSIS — R630 Anorexia: Secondary | ICD-10-CM | POA: Diagnosis not present

## 2016-05-21 DIAGNOSIS — I10 Essential (primary) hypertension: Secondary | ICD-10-CM | POA: Diagnosis not present

## 2016-05-21 DIAGNOSIS — K1379 Other lesions of oral mucosa: Secondary | ICD-10-CM | POA: Diagnosis not present

## 2016-05-21 DIAGNOSIS — K5900 Constipation, unspecified: Secondary | ICD-10-CM

## 2016-05-21 DIAGNOSIS — R110 Nausea: Secondary | ICD-10-CM | POA: Diagnosis not present

## 2016-05-21 DIAGNOSIS — E860 Dehydration: Secondary | ICD-10-CM | POA: Diagnosis not present

## 2016-05-21 DIAGNOSIS — C090 Malignant neoplasm of tonsillar fossa: Secondary | ICD-10-CM | POA: Diagnosis not present

## 2016-05-21 DIAGNOSIS — D696 Thrombocytopenia, unspecified: Secondary | ICD-10-CM | POA: Diagnosis not present

## 2016-05-21 DIAGNOSIS — M129 Arthropathy, unspecified: Secondary | ICD-10-CM

## 2016-05-21 DIAGNOSIS — Z923 Personal history of irradiation: Secondary | ICD-10-CM

## 2016-05-21 DIAGNOSIS — Z9221 Personal history of antineoplastic chemotherapy: Secondary | ICD-10-CM

## 2016-05-21 DIAGNOSIS — Z79899 Other long term (current) drug therapy: Secondary | ICD-10-CM

## 2016-05-21 LAB — CBC WITH DIFFERENTIAL/PLATELET
BASOS PCT: 0 %
Basophils Absolute: 0 10*3/uL (ref 0–0.1)
EOS PCT: 0 %
Eosinophils Absolute: 0 10*3/uL (ref 0–0.7)
HEMATOCRIT: 20.7 % — AB (ref 35.0–47.0)
Hemoglobin: 7.5 g/dL — ABNORMAL LOW (ref 12.0–16.0)
Lymphocytes Relative: 5 %
Lymphs Abs: 0.3 10*3/uL — ABNORMAL LOW (ref 1.0–3.6)
MCH: 34.8 pg — ABNORMAL HIGH (ref 26.0–34.0)
MCHC: 36.1 g/dL — AB (ref 32.0–36.0)
MCV: 96.4 fL (ref 80.0–100.0)
MONO ABS: 0.8 10*3/uL (ref 0.2–0.9)
MONOS PCT: 11 %
NEUTROS ABS: 5.9 10*3/uL (ref 1.4–6.5)
Neutrophils Relative %: 84 %
Platelets: 203 10*3/uL (ref 150–440)
RBC: 2.15 MIL/uL — ABNORMAL LOW (ref 3.80–5.20)
RDW: 21.2 % — AB (ref 11.5–14.5)
WBC: 7 10*3/uL (ref 3.6–11.0)

## 2016-05-21 LAB — BASIC METABOLIC PANEL
Anion gap: 8 (ref 5–15)
BUN: 8 mg/dL (ref 6–20)
CALCIUM: 8.5 mg/dL — AB (ref 8.9–10.3)
CO2: 25 mmol/L (ref 22–32)
CREATININE: 0.65 mg/dL (ref 0.44–1.00)
Chloride: 91 mmol/L — ABNORMAL LOW (ref 101–111)
GFR calc Af Amer: >60 mL/min (ref >60)
GFR calc non Af Amer: >60 mL/min (ref >60)
GLUCOSE: 107 mg/dL — AB (ref 65–99)
Potassium: 3.6 mmol/L (ref 3.5–5.1)
Sodium: 124 mmol/L — ABNORMAL LOW (ref 135–145)

## 2016-05-21 NOTE — Progress Notes (Signed)
Patient is here for follow up, everything she eats tastes like trash but she does have protein shakes.

## 2016-05-22 ENCOUNTER — Ambulatory Visit
Admission: RE | Admit: 2016-05-22 | Discharge: 2016-05-22 | Disposition: A | Discharge status: Home / Self Care | Payer: BLUE CROSS/BLUE SHIELD | Source: Ambulatory Visit | Attending: Radiation Oncology | Admitting: Radiation Oncology

## 2016-05-22 ENCOUNTER — Inpatient Hospital Stay: Payer: BLUE CROSS/BLUE SHIELD

## 2016-05-22 DIAGNOSIS — C090 Malignant neoplasm of tonsillar fossa: Secondary | ICD-10-CM | POA: Diagnosis not present

## 2016-05-23 ENCOUNTER — Ambulatory Visit
Admission: RE | Admit: 2016-05-23 | Discharge: 2016-05-23 | Disposition: A | Discharge status: Home / Self Care | Payer: BLUE CROSS/BLUE SHIELD | Source: Ambulatory Visit | Attending: Radiation Oncology | Admitting: Radiation Oncology

## 2016-05-23 ENCOUNTER — Ambulatory Visit: Payer: BLUE CROSS/BLUE SHIELD

## 2016-05-23 DIAGNOSIS — C090 Malignant neoplasm of tonsillar fossa: Secondary | ICD-10-CM | POA: Diagnosis not present

## 2016-05-24 ENCOUNTER — Ambulatory Visit
Admission: RE | Admit: 2016-05-24 | Discharge: 2016-05-24 | Disposition: A | Discharge status: Home / Self Care | Payer: BLUE CROSS/BLUE SHIELD | Source: Ambulatory Visit | Attending: Radiation Oncology | Admitting: Radiation Oncology

## 2016-05-24 DIAGNOSIS — C090 Malignant neoplasm of tonsillar fossa: Secondary | ICD-10-CM | POA: Diagnosis not present

## 2016-06-07 ENCOUNTER — Encounter: Payer: Self-pay | Admitting: *Deleted

## 2016-06-20 ENCOUNTER — Other Ambulatory Visit: Payer: Self-pay | Admitting: *Deleted

## 2016-06-21 ENCOUNTER — Other Ambulatory Visit: Payer: Self-pay | Admitting: *Deleted

## 2016-06-21 MED ORDER — SUCRALFATE 1 G PO TABS: 1.0000 g | ORAL_TABLET | Freq: Four times a day (QID) | ORAL | 2 refills | Status: DC

## 2016-06-27 ENCOUNTER — Ambulatory Visit: Payer: BLUE CROSS/BLUE SHIELD | Admitting: Radiation Oncology

## 2016-07-02 ENCOUNTER — Ambulatory Visit
Admission: RE | Admit: 2016-07-02 | Discharge: 2016-07-02 | Disposition: A | Discharge status: Home / Self Care | Payer: BLUE CROSS/BLUE SHIELD | Source: Ambulatory Visit | Attending: Radiation Oncology | Admitting: Radiation Oncology

## 2016-07-02 ENCOUNTER — Encounter: Payer: Self-pay | Admitting: Radiation Oncology

## 2016-07-02 VITALS — BP 123/71 | HR 72 | Temp 98.1°F | Resp 18 | Wt 141.3 lb

## 2016-07-02 DIAGNOSIS — Z85818 Personal history of malignant neoplasm of other sites of lip, oral cavity, and pharynx: Secondary | ICD-10-CM | POA: Diagnosis not present

## 2016-07-02 DIAGNOSIS — C099 Malignant neoplasm of tonsil, unspecified: Secondary | ICD-10-CM

## 2016-07-02 DIAGNOSIS — Z923 Personal history of irradiation: Secondary | ICD-10-CM | POA: Insufficient documentation

## 2016-07-02 NOTE — Progress Notes (Signed)
Radiation Oncology Follow up Note  Name: Helen Snow   Date:   07/02/2016 MRN:  [id number] DOB: [birth year]    This 66 y.o. female presents to the clinic today for one-month follow-up status post combined modality treatment for stage III squamous cell carcinoma the left tonsil.  REFERRING PROVIDER: Glendon Axe, MD  HPI: Patient is a 66 year old female now out 1 month having completed combined modality treatment for stage III (T2 N2 M0) squamous cell carcinoma of the left tonsil HPV positive. Seen today in routine follow-up she is really doing well. No head and neck pain or dysphagia.. Her ear pain has resolved. She is not yet scheduled follow-up with ENT.  COMPLICATIONS OF TREATMENT: none  FOLLOW UP COMPLIANCE: keeps appointments   PHYSICAL EXAM:  BP 123/71   Pulse 72   Temp 98.1 F (36.7 C)   Resp 18   Wt 141 lb 5 oz (64.1 kg)   BMI 23.52 kg/m  Oral cavity is clear tonsil regions within normal limits indirect mirror examination shows upper airway clear vallecula and base of tongue within normal limits. Neck is clear without evidence of subject gastric cervical or supraclavicular adenopathy. Well-developed well-nourished patient in NAD. HEENT reveals PERLA, EOMI, discs not visualized.  Oral cavity is clear. No oral mucosal lesions are identified. Neck is clear without evidence of cervical or supraclavicular adenopathy. Lungs are clear to A&P. Cardiac examination is essentially unremarkable with regular rate and rhythm without murmur rub or thrill. Abdomen is benign with no organomegaly or masses noted. Motor sensory and DTR levels are equal and symmetric in the upper and lower extremities. Cranial nerves II through XII are grossly intact. Proprioception is intact. No peripheral adenopathy or edema is identified. No motor or sensory levels are noted. Crude visual fields are within normal range.  RADIOLOGY RESULTS: No current films for review  PLAN: Present time she is current doing well  1 month out with no significant side effects. I'm please were overall progress. She has no evidence of disease. I've asked her to make a follow-up appointment with ENT for the next several weeks and I have asked to see her back in 4 months for follow-up. She knows to call sooner with any concerns. She is scheduled for a PET CT scan in about 2 weeks and I will review that when it becomes available.  I would like to take this opportunity to thank you for allowing me to participate in the care of your patient.Armstead Peaks., MD

## 2016-07-05 ENCOUNTER — Inpatient Hospital Stay: Payer: BLUE CROSS/BLUE SHIELD | Attending: Oncology

## 2016-07-08 ENCOUNTER — Telehealth: Payer: Self-pay

## 2016-07-08 NOTE — Progress Notes (Signed)
Nutrition  Met patient at survivorship nutrition program and patient gave me phone number to call and speak with her.  Called patient this pm and left message on voicemail. Will await return call and follow-up as able.  Tomeca Helm B. Zenia Resides, [place], [place] Registered Dietitian [phone] (pager)

## 2016-07-08 NOTE — Telephone Encounter (Signed)
Survivorship Care Plan visit completed.  Treatment summary reviewed and given to patient.  ASCO answers booklet reviewed and given to patient.  CARE program and Cancer Transitions discussed with patient along with other resources cancer center offers to patients and caregivers.  Patient verbalized understanding.    

## 2016-07-17 ENCOUNTER — Ambulatory Visit
Admission: RE | Admit: 2016-07-17 | Discharge: 2016-07-17 | Disposition: A | Discharge status: Home / Self Care | Payer: Medicare Other | Source: Ambulatory Visit | Attending: Oncology | Admitting: Oncology

## 2016-07-17 ENCOUNTER — Inpatient Hospital Stay: Payer: Medicare Other | Attending: Oncology | Admitting: *Deleted

## 2016-07-17 DIAGNOSIS — C099 Malignant neoplasm of tonsil, unspecified: Secondary | ICD-10-CM

## 2016-07-17 DIAGNOSIS — I700 Atherosclerosis of aorta: Secondary | ICD-10-CM | POA: Insufficient documentation

## 2016-07-17 DIAGNOSIS — M129 Arthropathy, unspecified: Secondary | ICD-10-CM | POA: Diagnosis not present

## 2016-07-17 DIAGNOSIS — D649 Anemia, unspecified: Secondary | ICD-10-CM | POA: Insufficient documentation

## 2016-07-17 DIAGNOSIS — R5383 Other fatigue: Secondary | ICD-10-CM | POA: Diagnosis not present

## 2016-07-17 DIAGNOSIS — C760 Malignant neoplasm of head, face and neck: Secondary | ICD-10-CM | POA: Diagnosis not present

## 2016-07-17 DIAGNOSIS — I10 Essential (primary) hypertension: Secondary | ICD-10-CM | POA: Diagnosis not present

## 2016-07-17 DIAGNOSIS — R531 Weakness: Secondary | ICD-10-CM | POA: Insufficient documentation

## 2016-07-17 DIAGNOSIS — K5900 Constipation, unspecified: Secondary | ICD-10-CM | POA: Diagnosis not present

## 2016-07-17 DIAGNOSIS — E871 Hypo-osmolality and hyponatremia: Secondary | ICD-10-CM | POA: Diagnosis not present

## 2016-07-17 LAB — BASIC METABOLIC PANEL
ANION GAP: 7 (ref 5–15)
BUN: 21 mg/dL — ABNORMAL HIGH (ref 6–20)
CO2: 27 mmol/L (ref 22–32)
Calcium: 9.1 mg/dL (ref 8.9–10.3)
Chloride: 96 mmol/L — ABNORMAL LOW (ref 101–111)
Creatinine, Ser: 0.81 mg/dL (ref 0.44–1.00)
GFR calc Af Amer: >60 mL/min (ref >60)
Glucose, Bld: 144 mg/dL — ABNORMAL HIGH (ref 65–99)
POTASSIUM: 3.6 mmol/L (ref 3.5–5.1)
SODIUM: 130 mmol/L — AB (ref 135–145)

## 2016-07-17 LAB — CBC WITH DIFFERENTIAL/PLATELET
BASOS ABS: 0 10*3/uL (ref 0–0.1)
Basophils Relative: 0 %
EOS ABS: 0 10*3/uL (ref 0–0.7)
EOS PCT: 1 %
HCT: 31.8 % — ABNORMAL LOW (ref 35.0–47.0)
Hemoglobin: 11.5 g/dL — ABNORMAL LOW (ref 12.0–16.0)
Lymphocytes Relative: 20 %
Lymphs Abs: 0.7 10*3/uL — ABNORMAL LOW (ref 1.0–3.6)
MCH: 35.3 pg — ABNORMAL HIGH (ref 26.0–34.0)
MCHC: 36.1 g/dL — ABNORMAL HIGH (ref 32.0–36.0)
MCV: 97.9 fL (ref 80.0–100.0)
Monocytes Absolute: 0.4 10*3/uL (ref 0.2–0.9)
Monocytes Relative: 10 %
Neutro Abs: 2.6 10*3/uL (ref 1.4–6.5)
Neutrophils Relative %: 69 %
PLATELETS: 153 10*3/uL (ref 150–440)
RBC: 3.24 MIL/uL — ABNORMAL LOW (ref 3.80–5.20)
RDW: 11.8 % (ref 11.5–14.5)
WBC: 3.7 10*3/uL (ref 3.6–11.0)

## 2016-07-17 LAB — GLUCOSE, CAPILLARY: GLUCOSE-CAPILLARY: 93 mg/dL (ref 65–99)

## 2016-07-17 MED ORDER — FLUDEOXYGLUCOSE F - 18 (FDG) INJECTION
12.3500 | Freq: Once | INTRAVENOUS | Status: AC | PRN
  Administered 2016-07-17: 12.35 via INTRAVENOUS

## 2016-07-18 NOTE — Progress Notes (Signed)
Nutrition Assessment   Reason for Assessment:   Patient with no taste following radiation and weight loss.  Met in survivor program.  ASSESSMENT:  Received return phone call from patient this am from message left on 4/30.   66 year old female with stage III squamous cell cancer left tonsil HPV positive.  Patient has completed radiation on 3/16 was not able to finish chemotherapy due to declining status.  Patient reports had PET scan yesterday.   Patient reports that taste has not fully come back at this time and food does not have much taste.  Reports that she is continuing to eat but is only eating to live.  Patient reports that it is hard to eat for one person as she is widowed.  She often finds herself going to [employer], [employer] or other places to pick up something because she does not want to go to all the trouble to prepare a meal that she can't taste.  Reports that she likes and enjoys carnation instant breakfast and will add low carb ice cream to it and make milkshake.    Patient reports that she recently has gone back to work and that has helped take her mind off of her cancer and lack of taste.    Nutrition Focused Physical Exam: deferred, phone call  Medications: reviewed  Labs: reviewed  Anthropometrics:   Height: 65 inches Weight: 136 lb (5/9) UBW: 148 lb in Jan 2018 BMI: 22  8% weight loss in the last 4 months, significant Patient reports that she is happy with her current weight and wants to maintain this weight   NUTRITION DIAGNOSIS: Inadequate oral intake related to cancer related treatments as evidenced by 8% weight loss in 4 months and poor appetite   INTERVENTION:   Discussed strategies to help with lack of taste.  Fact sheet will be left with RN as patient coming in for visit on May 14th. Discussed importance of small frequent meals Discussed importance of good overall nutrition despite decreased taste.  Encouraged good sources of calories and protein.  Patient  verbalized understanding    MONITORING, EVALUATION, GOAL: Patient will consume adequate calories to prevent further weight loss   NEXT VISIT: as needed  Keegen Heffern B. Zenia Resides, [place], [place] Registered Dietitian [phone] (pager)

## 2016-07-19 DIAGNOSIS — Z131 Encounter for screening for diabetes mellitus: Secondary | ICD-10-CM | POA: Diagnosis not present

## 2016-07-19 DIAGNOSIS — E7800 Pure hypercholesterolemia, unspecified: Secondary | ICD-10-CM | POA: Diagnosis not present

## 2016-07-19 DIAGNOSIS — I10 Essential (primary) hypertension: Secondary | ICD-10-CM | POA: Diagnosis not present

## 2016-07-19 DIAGNOSIS — E559 Vitamin D deficiency, unspecified: Secondary | ICD-10-CM | POA: Diagnosis not present

## 2016-07-21 NOTE — Progress Notes (Signed)
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  INTERVAL HISTORY: Patient returns to clinic today for further evaluation and discussion of her PET scan results. She has mild weakness and fatigue in the morning, but otherwise feels well and back to baseline. She does not complain of mouth pain or dysphagia today. She has a good appetite. She has no neurologic complaints. She denies any recent fevers. She has no chest pain or shortness of breath. She denies any nausea, vomiting, constipation, or diarrhea. She has no urinary complaints. Patient offers no further specific complaints today.  REVIEW OF SYSTEMS:   Review of Systems  Constitutional: Negative for fever, malaise/fatigue and weight loss.  HENT: Negative for ear pain, hearing loss and sore throat.   Respiratory: Negative.  Negative for cough and shortness of breath.   Cardiovascular: Negative.  Negative for chest pain and leg swelling.  Gastrointestinal: Negative.  Negative for abdominal pain, constipation and nausea.  Genitourinary: Negative.   Musculoskeletal: Negative.  Negative for neck pain.  Neurological: Negative for tingling, sensory change, weakness and headaches.  Psychiatric/Behavioral: Negative.  The patient is not nervous/anxious and does not have insomnia.     As per HPI. Otherwise, a complete review of systems is negative.  PAST MEDICAL HISTORY: Past Medical History:  Diagnosis Date  . Arthritis   . Cancer ([place]) 03/13/2016   Malignant neoplasm of tonsil  . Hypertension   . Postmenopausal     PAST SURGICAL HISTORY: No past surgical  history on file.  FAMILY HISTORY: Negative and noncontributory. No reported history of malignancy or chronic disease.  ADVANCED DIRECTIVES (Y/N):  N  HEALTH MAINTENANCE: Social History  Substance Use Topics  . Smoking status: Never Smoker  . Smokeless tobacco: Never Used  . Alcohol use Not on file     Colonoscopy:  PAP:  Bone density:  Lipid panel:  No Known Allergies  Current Outpatient Prescriptions  Medication Sig Dispense Refill  . alendronate (FOSAMAX) 70 MG tablet Take 70 mg by mouth once a week. Take with a full glass of water on an empty stomach.    Marland Kitchen amLODipine (NORVASC) 10 MG tablet Take 5 mg by mouth daily.     . Cholecalciferol (VITAMIN D) 2000 units tablet Take 2,000 Units by mouth daily.    . clobetasol cream (TEMOVATE) 4.74 % Apply 1 application topically 4 (four) times a week.    . co-enzyme Q-10 30 MG capsule Take 100 mg by mouth daily.    . finasteride (PROSCAR) 5 MG tablet Take 5 mg by mouth daily.    . hydrochlorothiazide (HYDRODIURIL) 25 MG tablet Take 12.5 mg by mouth daily.     Marland Kitchen lisinopril (PRINIVIL,ZESTRIL) 40 MG tablet Take 20 mg by mouth daily.     . magic mouthwash w/lidocaine SOLN Take 5 mLs by mouth 3 (three) times daily as needed for mouth pain. 300 mL 0  . niacin (NIASPAN) 500 MG CR tablet Take 500 mg by mouth at bedtime.    . Omega-3 Fatty Acids (FISH OIL OMEGA-3 PO) Take 1 capsule by mouth 4 (four) times a week.    . Pioglitazone HCl (ACTOS PO) Take 15 mg by mouth daily.     Marland Kitchen  pravastatin (PRAVACHOL) 40 MG tablet Take 40 mg by mouth daily.    Marland Kitchen zolpidem (AMBIEN) 5 MG tablet Take 1 tablet (5 mg total) by mouth at bedtime as needed for sleep. 30 tablet 0   No current facility-administered medications for this visit.     OBJECTIVE: Vitals:   07/22/16 1350  BP: 110/70  Pulse: 79  Resp: 18  Temp: 98.2 F (36.8 C)     Body mass index is 23.2 kg/m.    ECOG FS:0 - Asymptomatic  General: Well-developed, well-nourished, no acute  distress. Eyes: Pink conjunctiva; reddened, anicteric sclera. HEENT: Normocephalic, moist mucous membranes, clear oropharynx. No palpable neck lymphadenopathy.  Lungs: Clear to auscultation bilaterally. Heart: Regular rate and rhythm. No rubs, murmurs, or gallops. Abdomen: Soft, nontender, nondistended. No organomegaly noted, normoactive bowel sounds. Musculoskeletal: No edema, cyanosis, or clubbing. Neuro: Alert, answering all questions appropriately. Cranial nerves grossly intact. Skin: No rashes or petechiae noted. Psych: Normal affect. Lymphatics: No calvicular, axillary or inguinal LAD.   LAB RESULTS:  Lab Results  Component Value Date   NA 130 (L) 07/17/2016   K 3.6 07/17/2016   CL 96 (L) 07/17/2016   CO2 27 07/17/2016   GLUCOSE 144 (H) 07/17/2016   BUN 21 (H) 07/17/2016   CREATININE 0.81 07/17/2016   CALCIUM 9.1 07/17/2016   GFRNONAA >60 07/17/2016   GFRAA >60 07/17/2016    Lab Results  Component Value Date   WBC 3.7 07/17/2016   NEUTROABS 2.6 07/17/2016   HGB 11.5 (L) 07/17/2016   HCT 31.8 (L) 07/17/2016   MCV 97.9 07/17/2016   PLT 153 07/17/2016     STUDIES: Nm Pet Image Restag (ps) Skull Base To Thigh  Result Date: 07/17/2016 CLINICAL DATA:  Subsequent treatment strategy for head and neck cancer. Left tonsil. EXAM: NUCLEAR MEDICINE PET SKULL BASE TO THIGH TECHNIQUE: 12.35 mCi F-18 FDG was injected intravenously. Full-ring PET imaging was performed from the skull base to thigh after the radiotracer. CT data was obtained and used for attenuation correction and anatomic localization. FASTING BLOOD GLUCOSE:  Value: 93 mg/dl COMPARISON:  03/13/2016 FINDINGS: NECK Resolution of abnormal increased radiotracer uptake within the left tonsillar region. There has also been interval resolution of hypermetabolic cervical lymph nodes. CHEST No hypermetabolic mediastinal or hilar nodes. Aortic atherosclerosis noted. Calcifications within the LAD and left circumflex coronary  artery noted. No suspicious pulmonary nodules on the CT scan. ABDOMEN/PELVIS No abnormal hypermetabolic activity within the liver, pancreas, adrenal glands, or spleen. Aortic atherosclerosis noted. No hypermetabolic lymph nodes in the abdomen or pelvis. SKELETON No focal hypermetabolic activity to suggest skeletal metastasis. IMPRESSION: 1. Examination demonstrates interval complete metabolic response to therapy. 2. No areas of hypermetabolism identified to suggest distant metastatic disease. Electronically Signed   By: Kerby Moors M.D.   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  PLAN:    1. 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 PET scan results from Jul 17, 2017 reviewed independently and reported as above with complete metabolic response to therapy. No further intervention is needed at this time. Patient reports she has an ENT appointment on Thursday, Jul 25, 2016. Will consider repeat imaging with CT scan in 6-9 months.  Return to clinic in 3 months with repeat laboratory work and further evaluation.  2. Constipation: Continue OTC treatments as needed.  3. Hyponatremia: Mild, monitor.  4. Anemia: Mild.   Patient expressed understanding and was in  agreement with this plan. She also understands that She can call clinic at any time with any questions, concerns, or complaints.    Lloyd Huger, MD 07/22/16 2:11 PM

## 2016-07-22 ENCOUNTER — Inpatient Hospital Stay (HOSPITAL_BASED_OUTPATIENT_CLINIC_OR_DEPARTMENT_OTHER): Payer: Medicare Other | Admitting: Oncology

## 2016-07-22 VITALS — BP 110/70 | HR 79 | Temp 98.2°F | Resp 18 | Wt 139.4 lb

## 2016-07-22 DIAGNOSIS — C099 Malignant neoplasm of tonsil, unspecified: Secondary | ICD-10-CM

## 2016-07-22 DIAGNOSIS — I10 Essential (primary) hypertension: Secondary | ICD-10-CM | POA: Diagnosis not present

## 2016-07-22 DIAGNOSIS — R5383 Other fatigue: Secondary | ICD-10-CM | POA: Diagnosis not present

## 2016-07-22 DIAGNOSIS — D649 Anemia, unspecified: Secondary | ICD-10-CM | POA: Diagnosis not present

## 2016-07-22 DIAGNOSIS — K5900 Constipation, unspecified: Secondary | ICD-10-CM

## 2016-07-22 DIAGNOSIS — R531 Weakness: Secondary | ICD-10-CM

## 2016-07-22 DIAGNOSIS — E871 Hypo-osmolality and hyponatremia: Secondary | ICD-10-CM

## 2016-07-22 DIAGNOSIS — M129 Arthropathy, unspecified: Secondary | ICD-10-CM

## 2016-07-22 DIAGNOSIS — I700 Atherosclerosis of aorta: Secondary | ICD-10-CM | POA: Diagnosis not present

## 2016-07-22 NOTE — Progress Notes (Signed)
Complains of fatigue and generalized weakness in the mornings.

## 2016-07-25 DIAGNOSIS — C099 Malignant neoplasm of tonsil, unspecified: Secondary | ICD-10-CM | POA: Diagnosis not present

## 2016-08-08 DIAGNOSIS — I10 Essential (primary) hypertension: Secondary | ICD-10-CM | POA: Diagnosis not present

## 2016-08-08 DIAGNOSIS — M8589 Other specified disorders of bone density and structure, multiple sites: Secondary | ICD-10-CM | POA: Diagnosis not present

## 2016-08-08 DIAGNOSIS — E7800 Pure hypercholesterolemia, unspecified: Secondary | ICD-10-CM | POA: Diagnosis not present

## 2016-08-08 DIAGNOSIS — Z1231 Encounter for screening mammogram for malignant neoplasm of breast: Secondary | ICD-10-CM | POA: Diagnosis not present

## 2016-08-08 DIAGNOSIS — Z0000 Encounter for general adult medical examination without abnormal findings: Secondary | ICD-10-CM | POA: Diagnosis not present

## 2016-10-21 NOTE — Progress Notes (Signed)
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  INTERVAL HISTORY: Patient returns to clinic today for follow up for left tonsillar squamous cell carcinoma. Most recent PET showed complete metabolic response to treatment. She says that she feels well and is at her baseline. She complains of a new ulceration in her mouth that is sensitive to texture but not temperature. She has not tried otc treatments. She continues to suffer from dry mouth since XRT. She uses biotene with some relief. She has mild weakness and fatigue but feels this is improving. She denies mouth pain and dysphagia. She feels that her appetite is good but notices her tastes have changed. She has lost 13lbs since March but says this is purposeful. She has no neurologic complaints. She denies any recent fevers. She has no chest pain or shortness of breath. She denies any nausea, vomiting, constipation, or diarrhea. She has no urinary complaints. Patient offers no further specific complaints today.  REVIEW OF SYSTEMS:   Review of Systems  Constitutional: Positive for malaise/fatigue and weight loss. Negative for fever.  HENT: Negative for ear pain, hearing loss and sore throat.        Mouth sore  Respiratory: Negative.  Negative for cough and shortness of breath.   Cardiovascular: Negative.  Negative for chest pain and leg swelling.  Gastrointestinal: Negative.  Negative for abdominal pain, constipation and nausea.  Genitourinary: Negative.   Musculoskeletal: Negative.  Negative for neck pain.  Neurological: Negative for tingling, sensory change,  weakness and headaches.  Psychiatric/Behavioral: Negative.  The patient is not nervous/anxious and does not have insomnia.     As per HPI. Otherwise, a complete review of systems is negative.  PAST MEDICAL HISTORY: Past Medical History:  Diagnosis Date  . Arthritis   . Cancer ([place]) 03/13/2016   Malignant neoplasm of tonsil  . Hypertension   . Postmenopausal     PAST SURGICAL HISTORY: No past surgical history on file.  FAMILY HISTORY: Negative and noncontributory. No reported history of malignancy or chronic disease.  ADVANCED DIRECTIVES (Y/N):  N  HEALTH MAINTENANCE: Social History  Substance Use Topics  . Smoking status: Never Smoker  . Smokeless tobacco: Never Used  . Alcohol use Not on file     Colonoscopy:  PAP:  Bone density:  Lipid panel:  No Known Allergies  Current Outpatient Prescriptions  Medication Sig Dispense Refill  . amLODipine (NORVASC) 10 MG tablet Take 5 mg by mouth daily.     . Cholecalciferol (VITAMIN D) 2000 units tablet Take 2,000 Units by mouth daily.    . clobetasol cream (TEMOVATE) 4.58 % Apply 1 application topically 4 (four) times a week.    . finasteride (PROSCAR) 5 MG tablet Take 5 mg by mouth daily.    . hydrochlorothiazide (HYDRODIURIL) 25 MG tablet Take 12.5 mg by mouth daily.     Marland Kitchen lisinopril (PRINIVIL,ZESTRIL) 40 MG tablet Take 20 mg by mouth daily.     . magic mouthwash w/lidocaine SOLN Take 5 mLs by mouth 3 (three) times daily as needed for mouth pain. 300 mL 0  . niacin (NIASPAN) 500 MG CR tablet Take 500 mg by mouth at bedtime.    Marland Kitchen  Pioglitazone HCl (ACTOS PO) Take 15 mg by mouth daily.     . pravastatin (PRAVACHOL) 40 MG tablet Take 40 mg by mouth daily.    Marland Kitchen alendronate (FOSAMAX) 70 MG tablet Take 70 mg by mouth once a week. Take with a full glass of water on an empty stomach.    . co-enzyme Q-10 30 MG capsule Take 100 mg by mouth daily.    . Omega-3 Fatty Acids (FISH OIL OMEGA-3 PO) Take 1 capsule by mouth 4 (four) times a  week.    . zolpidem (AMBIEN) 5 MG tablet Take 1 tablet (5 mg total) by mouth at bedtime as needed for sleep. (Patient not taking: Reported on 10/23/2016) 30 tablet 0   No current facility-administered medications for this visit.     OBJECTIVE: Vitals:   10/23/16 1453  BP: 112/73  Pulse: 65  Resp: 18  Temp: (!) 97.3 F (36.3 C)     Body mass index is 22.42 kg/m.    ECOG FS:0 - Asymptomatic  General: Well-developed, well-nourished, no acute distress. Eyes: Pink conjunctiva; reddened, anicteric sclera. HEENT: Normocephalic, moist mucous membranes, clear oropharynx. No palpable neck lymphadenopathy. Small ulcer  Lungs: Clear to auscultation bilaterally. Heart: Regular rate and rhythm. No rubs, murmurs, or gallops. Abdomen: Soft, nontender, nondistended. No organomegaly noted, normoactive bowel sounds. Musculoskeletal: No edema, cyanosis, or clubbing. Neuro: Alert, answering all questions appropriately. Cranial nerves grossly intact. Skin: No rashes or petechiae noted. Psych: Normal affect. Lymphatics: No calvicular, axillary or inguinal LAD.   LAB RESULTS:  Lab Results  Component Value Date   NA 131 (L) 10/23/2016   K 3.9 10/23/2016   CL 94 (L) 10/23/2016   CO2 27 10/23/2016   GLUCOSE 99 10/23/2016   BUN 13 10/23/2016   CREATININE 0.78 10/23/2016   CALCIUM 9.3 10/23/2016   PROT 6.7 10/23/2016   ALBUMIN 4.7 10/23/2016   AST 33 10/23/2016   ALT 29 10/23/2016   ALKPHOS 52 10/23/2016   BILITOT 0.9 10/23/2016   GFRNONAA >60 10/23/2016   GFRAA >60 10/23/2016    Lab Results  Component Value Date   WBC 5.4 10/23/2016   NEUTROABS 4.0 10/23/2016   HGB 11.1 (L) 10/23/2016   HCT 30.7 (L) 10/23/2016   MCV 98.0 10/23/2016   PLT 166 10/23/2016     STUDIES: No results found.  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  PLAN:    1. 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 PET scan  results from Jul 17, 2017 reviewed independently and reported as above with complete metabolic response to therapy. No further intervention is needed at this time. Patient followed by ENT. Will repeat CT scan in 3 months. Few days later return to clinic for labs and re-evaluation.  2. Mouth ulceration- appeared 2 weeks ago but stable. Encouraged patient to follow up with ENT if area changes or worsens. Ok to continue use of biotene and/or magic mouthwash 3. Weight loss- patient has lost 13 lbs since March. She says that her tastes have changed and she is hoping to reduce her weight to 127lbs. She has seen dietician in the past and uses nutritional supplements drinks prn. Encouraged her to take in adequate calories and protein.  4. Hyponatremia: Mild, monitor. Will repeat labs at next appointment 4. Anemia: Mild.  Encouraged patient to eat iron rich foods. Patient declined further blood work today but may consider iron studies at future visits.    Patient expressed understanding  and was in agreement with this plan. She also understands that She can call clinic at any time with any questions, concerns, or complaints.   Jatavis Malek G. Zenia Resides, NP 10/23/16 3:12PM  Patient was seen and evaluated independently and I agree with the assessment and plan as dictated above.  Lloyd Huger, MD 10/24/16 10:13 AM

## 2016-10-23 ENCOUNTER — Other Ambulatory Visit: Payer: Self-pay

## 2016-10-23 ENCOUNTER — Inpatient Hospital Stay: Payer: Medicare Other

## 2016-10-23 ENCOUNTER — Inpatient Hospital Stay: Payer: Medicare Other | Attending: Oncology | Admitting: Oncology

## 2016-10-23 VITALS — BP 112/73 | HR 65 | Temp 97.3°F | Resp 18 | Wt 134.7 lb

## 2016-10-23 DIAGNOSIS — E039 Hypothyroidism, unspecified: Secondary | ICD-10-CM | POA: Diagnosis not present

## 2016-10-23 DIAGNOSIS — M129 Arthropathy, unspecified: Secondary | ICD-10-CM

## 2016-10-23 DIAGNOSIS — C099 Malignant neoplasm of tonsil, unspecified: Secondary | ICD-10-CM | POA: Diagnosis not present

## 2016-10-23 DIAGNOSIS — K117 Disturbances of salivary secretion: Secondary | ICD-10-CM

## 2016-10-23 DIAGNOSIS — R634 Abnormal weight loss: Secondary | ICD-10-CM

## 2016-10-23 DIAGNOSIS — Z923 Personal history of irradiation: Secondary | ICD-10-CM | POA: Diagnosis not present

## 2016-10-23 DIAGNOSIS — Z79899 Other long term (current) drug therapy: Secondary | ICD-10-CM

## 2016-10-23 DIAGNOSIS — D649 Anemia, unspecified: Secondary | ICD-10-CM

## 2016-10-23 DIAGNOSIS — I10 Essential (primary) hypertension: Secondary | ICD-10-CM | POA: Insufficient documentation

## 2016-10-23 DIAGNOSIS — K121 Other forms of stomatitis: Secondary | ICD-10-CM | POA: Diagnosis not present

## 2016-10-23 LAB — COMPREHENSIVE METABOLIC PANEL
ALT: 29 U/L (ref 14–54)
AST: 33 U/L (ref 15–41)
Albumin: 4.7 g/dL (ref 3.5–5.0)
Alkaline Phosphatase: 52 U/L (ref 38–126)
Anion gap: 10 (ref 5–15)
BILIRUBIN TOTAL: 0.9 mg/dL (ref 0.3–1.2)
BUN: 13 mg/dL (ref 6–20)
CO2: 27 mmol/L (ref 22–32)
Calcium: 9.3 mg/dL (ref 8.9–10.3)
Chloride: 94 mmol/L — ABNORMAL LOW (ref 101–111)
Creatinine, Ser: 0.78 mg/dL (ref 0.44–1.00)
GFR calc Af Amer: >60 mL/min (ref >60)
GFR calc non Af Amer: >60 mL/min (ref >60)
GLUCOSE: 99 mg/dL (ref 65–99)
POTASSIUM: 3.9 mmol/L (ref 3.5–5.1)
Sodium: 131 mmol/L — ABNORMAL LOW (ref 135–145)
TOTAL PROTEIN: 6.7 g/dL (ref 6.5–8.1)

## 2016-10-23 LAB — CBC WITH DIFFERENTIAL/PLATELET
Basophils Absolute: 0 10*3/uL (ref 0–0.1)
Basophils Relative: 0 %
EOS ABS: 0.1 10*3/uL (ref 0–0.7)
EOS PCT: 2 %
HCT: 30.7 % — ABNORMAL LOW (ref 35.0–47.0)
Hemoglobin: 11.1 g/dL — ABNORMAL LOW (ref 12.0–16.0)
LYMPHS ABS: 0.7 10*3/uL — AB (ref 1.0–3.6)
LYMPHS PCT: 14 %
MCH: 35.5 pg — AB (ref 26.0–34.0)
MCHC: 36.2 g/dL — AB (ref 32.0–36.0)
MCV: 98 fL (ref 80.0–100.0)
MONO ABS: 0.6 10*3/uL (ref 0.2–0.9)
MONOS PCT: 10 %
Neutro Abs: 4 10*3/uL (ref 1.4–6.5)
Neutrophils Relative %: 74 %
PLATELETS: 166 10*3/uL (ref 150–440)
RBC: 3.14 MIL/uL — AB (ref 3.80–5.20)
RDW: 12 % (ref 11.5–14.5)
WBC: 5.4 10*3/uL (ref 3.6–11.0)

## 2016-10-23 NOTE — Progress Notes (Signed)
Patient is here today for follow up, she is doing fairly well. Has no taste for food but appetite is good. She does mention a sore on the inside of her gum in the back on the right side.

## 2016-10-24 LAB — THYROID PANEL WITH TSH
Free Thyroxine Index: 2 (ref 1.2–4.9)
T3 Uptake Ratio: 26 % (ref 24–39)
T4 TOTAL: 7.8 ug/dL (ref 4.5–12.0)
TSH: 4.32 u[IU]/mL (ref 0.450–4.500)

## 2016-10-31 ENCOUNTER — Ambulatory Visit
Admission: RE | Admit: 2016-10-31 | Discharge: 2016-10-31 | Disposition: A | Discharge status: Home / Self Care | Payer: Medicare Other | Source: Ambulatory Visit | Attending: Radiation Oncology | Admitting: Radiation Oncology

## 2016-10-31 VITALS — BP 98/59 | HR 65 | Temp 97.5°F | Resp 16 | Ht 66.0 in | Wt 135.1 lb

## 2016-10-31 DIAGNOSIS — Z923 Personal history of irradiation: Secondary | ICD-10-CM | POA: Insufficient documentation

## 2016-10-31 DIAGNOSIS — Z85819 Personal history of malignant neoplasm of unspecified site of lip, oral cavity, and pharynx: Secondary | ICD-10-CM | POA: Diagnosis not present

## 2016-10-31 DIAGNOSIS — C099 Malignant neoplasm of tonsil, unspecified: Secondary | ICD-10-CM

## 2016-10-31 NOTE — Progress Notes (Signed)
Radiation Oncology Follow up Note  Name: Helen Snow   Date:   10/31/2016 MRN:  [id number] DOB: [birth year]    This 66 y.o. female presents to the clinic today for six-month follow-up status post concurrent chemoradiation for stage for a left tonsillar squamous cell carcinoma.  REFERRING PROVIDER: Glendon Axe, MD  HPI: Patient is a 66 year old female now out 6 months having completed concurrent chemoradiation for stage for a (T2 N2 cM0) left tonsillar squamous cell carcinoma. She is seen today in routine follow-up and is doing well. She still claims she has some taste loss she specifically denies dysphagia or head and neck pain.. She has continued close follow-up care with ENT who reports no evidence of disease. She is scheduled for a CT scan in the next several months which I've asked for a copy of. Her last pets CT scan which I have reviewed back in May shows complete metabolic response to treatment  COMPLICATIONS OF TREATMENT: none  FOLLOW UP COMPLIANCE: keeps appointments   PHYSICAL EXAM:  BP (!) 98/59 (BP Location: Left Arm, Patient Position: Sitting, Cuff Size: Small)   Pulse 65   Temp (!) 97.5 F (36.4 C) (Tympanic)   Resp 16   Ht 5\' 6"  (1.676 m)   Wt 135 lb 2.3 oz (61.3 kg)   BMI 21.81 kg/m  Oral cavity is clear no oral mucosal lesions are identified tonsillar regions are within normal limits. Indirect mirror examination shows upper airway clear vallecula and base of tongue within normal limits. Neck is clear without evidence of subject gastric cervical or supraclavicular adenopathy. Well-developed well-nourished patient in NAD. HEENT reveals PERLA, EOMI, discs not visualized.  Oral cavity is clear. No oral mucosal lesions are identified. Neck is clear without evidence of cervical or supraclavicular adenopathy. Lungs are clear to A&P. Cardiac examination is essentially unremarkable with regular rate and rhythm without murmur rub or thrill. Abdomen is benign with no organomegaly or  masses noted. Motor sensory and DTR levels are equal and symmetric in the upper and lower extremities. Cranial nerves II through XII are grossly intact. Proprioception is intact. No peripheral adenopathy or edema is identified. No motor or sensory levels are noted. Crude visual fields are within normal range.  RADIOLOGY RESULTS: PET CT scan reviewed and compatible with the above-stated findings  PLAN: Present time she continues to do well with no evidence of disease. I'm please were overall progress. I've asked to see her back in 6 months for follow-up. Will review her CT scan when it becomes available. She also continues close follow-up care with ENT.  I would like to take this opportunity to thank you for allowing me to participate in the care of your patient.Armstead Peaks., MD

## 2016-10-31 NOTE — Progress Notes (Signed)
Patient here for follow up. No changes since last appointment.  

## 2016-12-25 DIAGNOSIS — Z23 Encounter for immunization: Secondary | ICD-10-CM | POA: Diagnosis not present

## 2016-12-25 DIAGNOSIS — C099 Malignant neoplasm of tonsil, unspecified: Secondary | ICD-10-CM | POA: Diagnosis not present

## 2017-01-01 DIAGNOSIS — M810 Age-related osteoporosis without current pathological fracture: Secondary | ICD-10-CM | POA: Diagnosis not present

## 2017-01-20 ENCOUNTER — Ambulatory Visit
Admission: RE | Admit: 2017-01-20 | Discharge: 2017-01-20 | Disposition: A | Discharge status: Home / Self Care | Payer: Medicare Other | Source: Ambulatory Visit | Attending: Nurse Practitioner | Admitting: Nurse Practitioner

## 2017-01-20 DIAGNOSIS — Z9889 Other specified postprocedural states: Secondary | ICD-10-CM | POA: Insufficient documentation

## 2017-01-20 DIAGNOSIS — C099 Malignant neoplasm of tonsil, unspecified: Secondary | ICD-10-CM | POA: Diagnosis not present

## 2017-01-20 DIAGNOSIS — Z8589 Personal history of malignant neoplasm of other organs and systems: Secondary | ICD-10-CM | POA: Insufficient documentation

## 2017-01-20 DIAGNOSIS — I6523 Occlusion and stenosis of bilateral carotid arteries: Secondary | ICD-10-CM | POA: Diagnosis not present

## 2017-01-20 LAB — POCT I-STAT CREATININE: Creatinine, Ser: 0.3 mg/dL — ABNORMAL LOW (ref 0.44–1.00)

## 2017-01-20 MED ORDER — IOPAMIDOL (ISOVUE-300) INJECTION 61%
75.0000 mL | Freq: Once | INTRAVENOUS | Status: AC | PRN
  Administered 2017-01-20: 75 mL via INTRAVENOUS

## 2017-01-23 ENCOUNTER — Other Ambulatory Visit: Payer: Medicare Other

## 2017-01-23 ENCOUNTER — Ambulatory Visit: Payer: Medicare Other | Admitting: Oncology

## 2017-02-04 NOTE — Progress Notes (Signed)
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  INTERVAL HISTORY: Patient returns to clinic today for further evaluation and discussion of her CT scan results. She currently feels well and is asymptomatic.  She has occasional dry mouth, but denies any pain or dysphasia. She has a good appetite. She has no neurologic complaints. She denies any recent fevers. She has no chest pain or shortness of breath. She denies any nausea, vomiting, constipation, or diarrhea. She has no urinary complaints. Patient offers no further specific complaints today.  REVIEW OF SYSTEMS:   Review of Systems  Constitutional: Negative for fever, malaise/fatigue and weight loss.  HENT: Negative for ear pain, hearing loss and sore throat.   Respiratory: Negative.  Negative for cough and shortness of breath.   Cardiovascular: Negative.  Negative for chest pain and leg swelling.  Gastrointestinal: Negative.  Negative for abdominal pain, constipation and nausea.  Genitourinary: Negative.   Musculoskeletal: Negative.  Negative for neck pain.  Neurological: Negative for tingling, sensory change, weakness and headaches.  Psychiatric/Behavioral: Negative.  The patient is not nervous/anxious and does not have insomnia.     As per HPI. Otherwise, a complete review of systems is negative.  PAST MEDICAL HISTORY: Past Medical History:  Diagnosis Date  . Arthritis   . Cancer ([place]) 03/13/2016   Malignant neoplasm of tonsil  . Hypertension   . Postmenopausal     PAST SURGICAL HISTORY: History reviewed. No pertinent surgical history.  FAMILY  HISTORY: Negative and noncontributory. No reported history of malignancy or chronic disease.  ADVANCED DIRECTIVES (Y/N):  N  HEALTH MAINTENANCE: Social History   Tobacco Use  . Smoking status: Never Smoker  . Smokeless tobacco: Never Used  Substance Use Topics  . Alcohol use: Not on file  . Drug use: Not on file     Colonoscopy:  PAP:  Bone density:  Lipid panel:  No Known Allergies  Current Outpatient Medications  Medication Sig Dispense Refill  . alendronate (FOSAMAX) 70 MG tablet Take 70 mg by mouth once a week. Take with a full glass of water on an empty stomach.    Marland Kitchen amLODipine (NORVASC) 10 MG tablet Take 5 mg by mouth daily.     . Cholecalciferol (VITAMIN D) 2000 units tablet Take 2,000 Units by mouth daily.    . clobetasol cream (TEMOVATE) 3.55 % Apply 1 application topically 4 (four) times a week.    . co-enzyme Q-10 30 MG capsule Take 100 mg by mouth daily.    . finasteride (PROSCAR) 5 MG tablet Take 5 mg by mouth daily.    . hydrochlorothiazide (HYDRODIURIL) 25 MG tablet Take 12.5 mg by mouth daily.     Marland Kitchen lisinopril (PRINIVIL,ZESTRIL) 40 MG tablet Take 20 mg by mouth daily.     . magic mouthwash w/lidocaine SOLN Take 5 mLs by mouth 3 (three) times daily as needed for mouth pain. 300 mL 0  . niacin (NIASPAN) 500 MG CR tablet Take 500 mg by mouth at bedtime.    . Omega-3 Fatty Acids (FISH OIL OMEGA-3 PO) Take 1 capsule by mouth 4 (four) times a week.    . Pioglitazone HCl (ACTOS PO) Take 15 mg by mouth  daily.     . pravastatin (PRAVACHOL) 40 MG tablet Take 40 mg by mouth daily.    Marland Kitchen zolpidem (AMBIEN) 5 MG tablet Take 1 tablet (5 mg total) by mouth at bedtime as needed for sleep. 30 tablet 0   No current facility-administered medications for this visit.     OBJECTIVE: Vitals:   02/05/17 1355  BP: 119/68  Pulse: (!) 57  Resp: 18  Temp: (!) 97.3 F (36.3 C)     Body mass index is 22 kg/m.    ECOG FS:0 - Asymptomatic  General: Well-developed, well-nourished,  no acute distress. Eyes: Pink conjunctiva; reddened, anicteric sclera. HEENT: Normocephalic, moist mucous membranes, clear oropharynx. No palpable neck lymphadenopathy.  Lungs: Clear to auscultation bilaterally. Heart: Regular rate and rhythm. No rubs, murmurs, or gallops. Abdomen: Soft, nontender, nondistended. No organomegaly noted, normoactive bowel sounds. Musculoskeletal: No edema, cyanosis, or clubbing. Neuro: Alert, answering all questions appropriately. Cranial nerves grossly intact. Skin: No rashes or petechiae noted. Psych: Normal affect. Lymphatics: No calvicular, axillary or inguinal LAD.   LAB RESULTS:  Lab Results  Component Value Date   NA 131 (L) 02/05/2017   K 3.9 02/05/2017   CL 95 (L) 02/05/2017   CO2 26 02/05/2017   GLUCOSE 90 02/05/2017   BUN 16 02/05/2017   CREATININE 0.74 02/05/2017   CALCIUM 9.0 02/05/2017   PROT 6.7 10/23/2016   ALBUMIN 4.7 10/23/2016   AST 33 10/23/2016   ALT 29 10/23/2016   ALKPHOS 52 10/23/2016   BILITOT 0.9 10/23/2016   GFRNONAA >60 02/05/2017   GFRAA >60 02/05/2017    Lab Results  Component Value Date   WBC 4.7 02/05/2017   NEUTROABS 3.2 02/05/2017   HGB 12.1 02/05/2017   HCT 34.6 (L) 02/05/2017   MCV 97.9 02/05/2017   PLT 160 02/05/2017     STUDIES: Ct Soft Tissue Neck W Contrast  Result Date: 01/20/2017 CLINICAL DATA:  Tonsillar squamous cell carcinoma post treatment follow-up EXAM: CT NECK WITH CONTRAST TECHNIQUE: Multidetector CT imaging of the neck was performed using the standard protocol following the bolus administration of intravenous contrast. CONTRAST:  17mL ISOVUE-300 IOPAMIDOL (ISOVUE-300) INJECTION 61% COMPARISON:  PET 07/17/2016.  CT neck 02/22/2016 FINDINGS: Pharynx and larynx: Left tonsillar mass has resolved. Mild edema of the larynx and epiglottis. Airway intact. Salivary glands: No inflammation, mass, or stone. Thyroid: Negative Lymph nodes: No pathologic lymph nodes in the neck. Left retropharyngeal  node has resolved. Multiple enlarged left neck nodes have resolved. Vascular: Extensive atherosclerotic calcification right carotid bifurcation, likely with significant stenosis. Mild atherosclerotic disease left carotid bifurcation Limited intracranial: Negative Visualized orbits: Negative Mastoids and visualized paranasal sinuses: Mild mucosal edema in the maxillary sinus bilaterally. Otherwise negative Skeleton: Disc and facet degeneration in the cervical spine. No acute skeletal abnormality. Upper chest: Lung apices clear Other: None IMPRESSION: Interval resolution of left tonsillar mass and left neck adenopathy. No recurrent tumor or adenopathy on today's study Atherosclerotic calcification the carotid bifurcation. There is probable significant stenosis on the right. Correlate with Doppler study. Electronically Signed   By: Franchot Gallo M.D.   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  PLAN:    1. 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 CT scan results from January 20, 2017 reviewed independently and reported as above with no evidence of recurrent or progressive disease. No further intervention is needed at this time.  Patient does not require additional  imaging unless there is suspicion of recurrence.  Patient reports a normal endoscopy examine recently and continues to follow-up with ENT approximately every 6 months.  Return to clinic in 6 months with repeat laboratory work and further evaluation.  2. Constipation: Continue OTC treatments as needed.  3. Hyponatremia: Mild, monitor.  4. Anemia: Mild.   Patient expressed understanding and was in agreement with this plan. She also understands that She can call clinic at any time with any questions, concerns, or complaints.    Lloyd Huger, MD 02/07/17 3:59 PM

## 2017-02-05 ENCOUNTER — Encounter: Payer: Self-pay | Admitting: Oncology

## 2017-02-05 ENCOUNTER — Inpatient Hospital Stay (HOSPITAL_BASED_OUTPATIENT_CLINIC_OR_DEPARTMENT_OTHER): Payer: Medicare Other | Admitting: Oncology

## 2017-02-05 ENCOUNTER — Other Ambulatory Visit: Payer: Self-pay

## 2017-02-05 ENCOUNTER — Inpatient Hospital Stay: Payer: Medicare Other | Attending: Oncology

## 2017-02-05 VITALS — BP 119/68 | HR 57 | Temp 97.3°F | Resp 18 | Wt 136.3 lb

## 2017-02-05 DIAGNOSIS — R600 Localized edema: Secondary | ICD-10-CM | POA: Diagnosis not present

## 2017-02-05 DIAGNOSIS — Z79899 Other long term (current) drug therapy: Secondary | ICD-10-CM | POA: Insufficient documentation

## 2017-02-05 DIAGNOSIS — M129 Arthropathy, unspecified: Secondary | ICD-10-CM | POA: Diagnosis not present

## 2017-02-05 DIAGNOSIS — C099 Malignant neoplasm of tonsil, unspecified: Secondary | ICD-10-CM

## 2017-02-05 DIAGNOSIS — K5900 Constipation, unspecified: Secondary | ICD-10-CM

## 2017-02-05 DIAGNOSIS — D649 Anemia, unspecified: Secondary | ICD-10-CM

## 2017-02-05 DIAGNOSIS — I10 Essential (primary) hypertension: Secondary | ICD-10-CM | POA: Insufficient documentation

## 2017-02-05 DIAGNOSIS — E871 Hypo-osmolality and hyponatremia: Secondary | ICD-10-CM | POA: Insufficient documentation

## 2017-02-05 LAB — CBC WITH DIFFERENTIAL/PLATELET
BASOS PCT: 0 %
Basophils Absolute: 0 10*3/uL (ref 0–0.1)
EOS ABS: 0.1 10*3/uL (ref 0–0.7)
EOS PCT: 1 %
HCT: 34.6 % — ABNORMAL LOW (ref 35.0–47.0)
HEMOGLOBIN: 12.1 g/dL (ref 12.0–16.0)
LYMPHS ABS: 0.9 10*3/uL — AB (ref 1.0–3.6)
Lymphocytes Relative: 18 %
MCH: 34.4 pg — AB (ref 26.0–34.0)
MCHC: 35.1 g/dL (ref 32.0–36.0)
MCV: 97.9 fL (ref 80.0–100.0)
MONO ABS: 0.5 10*3/uL (ref 0.2–0.9)
MONOS PCT: 12 %
Neutro Abs: 3.2 10*3/uL (ref 1.4–6.5)
Neutrophils Relative %: 69 %
PLATELETS: 160 10*3/uL (ref 150–440)
RBC: 3.54 MIL/uL — ABNORMAL LOW (ref 3.80–5.20)
RDW: 11.6 % (ref 11.5–14.5)
WBC: 4.7 10*3/uL (ref 3.6–11.0)

## 2017-02-05 LAB — BASIC METABOLIC PANEL
Anion gap: 10 (ref 5–15)
BUN: 16 mg/dL (ref 6–20)
CALCIUM: 9 mg/dL (ref 8.9–10.3)
CHLORIDE: 95 mmol/L — AB (ref 101–111)
CO2: 26 mmol/L (ref 22–32)
CREATININE: 0.74 mg/dL (ref 0.44–1.00)
GFR calc non Af Amer: >60 mL/min (ref >60)
GLUCOSE: 90 mg/dL (ref 65–99)
Potassium: 3.9 mmol/L (ref 3.5–5.1)
Sodium: 131 mmol/L — ABNORMAL LOW (ref 135–145)

## 2017-02-05 NOTE — Progress Notes (Signed)
Patient here today for follow up, CT results. Patient reports continued changes in taste and dry mouth.

## 2017-04-28 ENCOUNTER — Encounter: Payer: Self-pay | Admitting: *Deleted

## 2017-05-22 ENCOUNTER — Ambulatory Visit: Payer: Medicare Other | Admitting: Radiation Oncology

## 2017-05-29 ENCOUNTER — Encounter: Payer: Self-pay | Admitting: Radiation Oncology

## 2017-05-29 ENCOUNTER — Ambulatory Visit
Admission: RE | Admit: 2017-05-29 | Discharge: 2017-05-29 | Disposition: A | Discharge status: Home / Self Care | Payer: PPO | Source: Ambulatory Visit | Attending: Radiation Oncology | Admitting: Radiation Oncology

## 2017-05-29 ENCOUNTER — Other Ambulatory Visit: Payer: Self-pay

## 2017-05-29 VITALS — BP 140/72 | HR 63 | Temp 98.3°F | Resp 18 | Wt 140.4 lb

## 2017-05-29 DIAGNOSIS — Z923 Personal history of irradiation: Secondary | ICD-10-CM | POA: Insufficient documentation

## 2017-05-29 DIAGNOSIS — C099 Malignant neoplasm of tonsil, unspecified: Secondary | ICD-10-CM

## 2017-05-29 DIAGNOSIS — Z9221 Personal history of antineoplastic chemotherapy: Secondary | ICD-10-CM | POA: Insufficient documentation

## 2017-05-29 DIAGNOSIS — Z85819 Personal history of malignant neoplasm of unspecified site of lip, oral cavity, and pharynx: Secondary | ICD-10-CM | POA: Insufficient documentation

## 2017-05-29 NOTE — Progress Notes (Signed)
Radiation Oncology Follow up Note  Name: Helen Snow   Date:   05/29/2017 MRN:  [id number] DOB: 1950/07/05    This 67 y.o. female presents to the clinic today for 1 year follow-up status post concurrent chemotherapy radiation therapy for stage IV left tonsillar squamous cell carcinoma.  REFERRING PROVIDER: Glendon Axe, MD  HPI: patient is a 67 year old female now out 1 year having completed concurrent chemoradiation for stage for (T2 N2 cM0) left tonsillar squamous cell carcinoma. She seen today in routine follow-up and is doing well. She specifically denies any dysphagia or head and neck pain. Taste is returning to normal. She states her mouth is slightly dry not really problematic.Marland Kitchen  COMPLICATIONS OF TREATMENT: none  FOLLOW UP COMPLIANCE: keeps appointments   PHYSICAL EXAM:  BP 140/72   Pulse 63   Temp 98.3 F (36.8 C)   Resp 18   Wt 140 lb 6.9 oz (63.7 kg)   BMI 22.67 kg/m  Oral cavity is clear no oral mucosal lesions are identified in indirect mirror examination shows upper airway clear vallecula and base of tongue within normal limits neck is clear without evidence of subject gastric cervical or supraclavicular adenopathy. Well-developed well-nourished patient in NAD. HEENT reveals PERLA, EOMI, discs not visualized.  Oral cavity is clear. No oral mucosal lesions are identified. Neck is clear without evidence of cervical or supraclavicular adenopathy. Lungs are clear to A&P. Cardiac examination is essentially unremarkable with regular rate and rhythm without murmur rub or thrill. Abdomen is benign with no organomegaly or masses noted. Motor sensory and DTR levels are equal and symmetric in the upper and lower extremities. Cranial nerves II through XII are grossly intact. Proprioception is intact. No peripheral adenopathy or edema is identified. No motor or sensory levels are noted. Crude visual fields are within normal range.  RADIOLOGY RESULTS: CT scan from November 2018 reviewed  showing no evidence of disease  PLAN: present time she continues to do well with no evidence of disease. I'm please were overall progress. I've asked to see her back in 1 year for follow-up. She continues close follow-up care with ENT and medical oncology. Patient knows to call with any concerns.  I would like to take this opportunity to thank you for allowing me to participate in the care of your patient.Noreene Filbert, MD

## 2017-06-13 DIAGNOSIS — Z79899 Other long term (current) drug therapy: Secondary | ICD-10-CM | POA: Diagnosis not present

## 2017-06-13 DIAGNOSIS — L661 Lichen planopilaris: Secondary | ICD-10-CM | POA: Diagnosis not present

## 2017-06-25 DIAGNOSIS — C099 Malignant neoplasm of tonsil, unspecified: Secondary | ICD-10-CM | POA: Diagnosis not present

## 2017-06-25 DIAGNOSIS — R070 Pain in throat: Secondary | ICD-10-CM | POA: Diagnosis not present

## 2017-06-26 DIAGNOSIS — E7800 Pure hypercholesterolemia, unspecified: Secondary | ICD-10-CM | POA: Diagnosis not present

## 2017-06-26 DIAGNOSIS — M810 Age-related osteoporosis without current pathological fracture: Secondary | ICD-10-CM | POA: Diagnosis not present

## 2017-06-26 DIAGNOSIS — I6521 Occlusion and stenosis of right carotid artery: Secondary | ICD-10-CM | POA: Diagnosis not present

## 2017-06-26 DIAGNOSIS — F411 Generalized anxiety disorder: Secondary | ICD-10-CM | POA: Diagnosis not present

## 2017-06-26 DIAGNOSIS — R05 Cough: Secondary | ICD-10-CM | POA: Diagnosis not present

## 2017-06-26 DIAGNOSIS — I10 Essential (primary) hypertension: Secondary | ICD-10-CM | POA: Diagnosis not present

## 2017-06-26 DIAGNOSIS — Z23 Encounter for immunization: Secondary | ICD-10-CM | POA: Diagnosis not present

## 2017-08-17 NOTE — Progress Notes (Signed)
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  INTERVAL HISTORY: Patient returns to clinic today for routine six-month evaluation.  She continues to feel well and remains asymptomatic. She has occasional dry mouth, but denies any pain or dysphasia. She has a good appetite. She has no neurologic complaints. She denies any recent fevers. She has no chest pain or shortness of breath. She denies any nausea, vomiting, constipation, or diarrhea. She has no urinary complaints.  Patient feels that her baseline offers no specific complaints today.  REVIEW OF SYSTEMS:   Review of Systems  Constitutional: Negative.  Negative for fever, malaise/fatigue and weight loss.  HENT: Negative.  Negative for ear pain, hearing loss and sore throat.   Respiratory: Negative.  Negative for cough and shortness of breath.   Cardiovascular: Negative.  Negative for chest pain and leg swelling.  Gastrointestinal: Negative.  Negative for abdominal pain, constipation and nausea.  Genitourinary: Negative.   Musculoskeletal: Negative.  Negative for neck pain.  Neurological: Negative.  Negative for tingling, sensory change, weakness and headaches.  Psychiatric/Behavioral: Negative.  The patient is not nervous/anxious and does not have insomnia.     As per HPI. Otherwise, a complete review of systems is negative.  PAST MEDICAL HISTORY: Past Medical History:  Diagnosis Date  . Arthritis   . Cancer ([place]) 03/13/2016   Malignant neoplasm of tonsil  . Hypertension   . Postmenopausal     PAST SURGICAL HISTORY: History reviewed. No pertinent  surgical history.  FAMILY HISTORY: Negative and noncontributory. No reported history of malignancy or chronic disease.  ADVANCED DIRECTIVES (Y/N):  N  HEALTH MAINTENANCE: Social History   Tobacco Use  . Smoking status: Never Smoker  . Smokeless tobacco: Never Used  Substance Use Topics  . Alcohol use: Not on file  . Drug use: Not on file     Colonoscopy:  PAP:  Bone density:  Lipid panel:  Allergies  Allergen Reactions  . Pioglitazone Swelling    Fluid retention    Current Outpatient Medications  Medication Sig Dispense Refill  . alendronate (FOSAMAX) 70 MG tablet Take 70 mg by mouth once a week. Take with a full glass of water on an empty stomach.    . ALPRAZolam (XANAX) 0.25 MG tablet Take by mouth.    Marland Kitchen amLODipine (NORVASC) 10 MG tablet Take 5 mg by mouth daily.     . Cholecalciferol (VITAMIN D) 2000 units tablet Take 2,000 Units by mouth daily.    . clobetasol cream (TEMOVATE) 9.62 % Apply 1 application topically 4 (four) times a week.    . co-enzyme Q-10 30 MG capsule Take 100 mg by mouth daily.    . finasteride (PROSCAR) 5 MG tablet Take 5 mg by mouth daily.    Marland Kitchen lisinopril (PRINIVIL,ZESTRIL) 40 MG tablet Take 20 mg by mouth daily.     . minocycline (MINOCIN,DYNACIN) 100 MG capsule Take 100 mg by mouth 2 (two) times daily.  2  . niacin (NIASPAN) 500 MG CR tablet Take 500 mg by mouth at bedtime.    . Pioglitazone HCl (ACTOS PO) Take 15 mg by mouth daily.     . pravastatin (PRAVACHOL) 40 MG tablet Take 40  mg by mouth daily.    Marland Kitchen zolpidem (AMBIEN) 5 MG tablet Take 1 tablet (5 mg total) by mouth at bedtime as needed for sleep. 30 tablet 0  . hydrochlorothiazide (HYDRODIURIL) 25 MG tablet Take 12.5 mg by mouth daily.     . Omega-3 Fatty Acids (FISH OIL OMEGA-3 PO) Take 1 capsule by mouth 4 (four) times a week.     No current facility-administered medications for this visit.     OBJECTIVE: Vitals:   08/20/17 1417  BP: 106/63  Pulse: 67  Resp: 18  Temp: 98 F  (36.7 C)     Body mass index is 21.66 kg/m.    ECOG FS:0 - Asymptomatic  General: Well-developed, well-nourished, no acute distress. Eyes: Pink conjunctiva, anicteric sclera. HEENT: Normocephalic, moist mucous membranes, clear oropharnyx.  No palpable lymphadenopathy. Lungs: Clear to auscultation bilaterally. Heart: Regular rate and rhythm. No rubs, murmurs, or gallops. Abdomen: Soft, nontender, nondistended. No organomegaly noted, normoactive bowel sounds. Musculoskeletal: No edema, cyanosis, or clubbing. Neuro: Alert, answering all questions appropriately. Cranial nerves grossly intact. Skin: No rashes or petechiae noted. Psych: Normal affect.  LAB RESULTS:  Lab Results  Component Value Date   NA 133 (L) 08/20/2017   K 4.1 08/20/2017   CL 99 (L) 08/20/2017   CO2 24 08/20/2017   GLUCOSE 89 08/20/2017   BUN 18 08/20/2017   CREATININE 0.73 08/20/2017   CALCIUM 9.3 08/20/2017   PROT 6.7 10/23/2016   ALBUMIN 4.7 10/23/2016   AST 33 10/23/2016   ALT 29 10/23/2016   ALKPHOS 52 10/23/2016   BILITOT 0.9 10/23/2016   GFRNONAA >60 08/20/2017   GFRAA >60 08/20/2017    Lab Results  Component Value Date   WBC 6.9 08/20/2017   NEUTROABS 5.3 08/20/2017   HGB 12.7 08/20/2017   HCT 36.0 08/20/2017   MCV 99.0 08/20/2017   PLT 172 08/20/2017     STUDIES: No results found.  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  PLAN:    1. 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  Patient completed her weekly chemotherapy with cisplatin along with daily XRT in approximately April 30, 2016.  CT scan results from January 20, 2017 reviewed independently with no evidence of recurrent or progressive disease. No further intervention is needed at this time.  Patient does not require additional imaging unless there is suspicion of recurrence.  Patient reports a normal endoscopy examine recently and continues to  follow-up with ENT approximately every 6 months.  Return to clinic in 6 months for repeat laboratory work and further evaluation at which time she will be 2 years removed from completing treatments and can likely be switched to yearly visits.   2. Constipation: Patient does not complain of this today. 3. Hyponatremia: Chronic and unchanged. 4. Anemia: Resolved.  Approximately 20 minutes was spent in discussion of which greater than 50% was consultation.   Patient expressed understanding and was in agreement with this plan. She also understands that She can call clinic at any time with any questions, concerns, or complaints.    Lloyd Huger, MD 08/24/17 7:08 AM

## 2017-08-20 ENCOUNTER — Inpatient Hospital Stay (HOSPITAL_BASED_OUTPATIENT_CLINIC_OR_DEPARTMENT_OTHER): Payer: PPO | Admitting: Oncology

## 2017-08-20 ENCOUNTER — Encounter: Payer: Self-pay | Admitting: Oncology

## 2017-08-20 ENCOUNTER — Inpatient Hospital Stay: Payer: PPO | Attending: Oncology

## 2017-08-20 VITALS — BP 106/63 | HR 67 | Temp 98.0°F | Resp 18 | Wt 134.2 lb

## 2017-08-20 DIAGNOSIS — B977 Papillomavirus as the cause of diseases classified elsewhere: Secondary | ICD-10-CM | POA: Diagnosis not present

## 2017-08-20 DIAGNOSIS — C099 Malignant neoplasm of tonsil, unspecified: Secondary | ICD-10-CM | POA: Insufficient documentation

## 2017-08-20 DIAGNOSIS — E871 Hypo-osmolality and hyponatremia: Secondary | ICD-10-CM | POA: Insufficient documentation

## 2017-08-20 LAB — BASIC METABOLIC PANEL
Anion gap: 10 (ref 5–15)
BUN: 18 mg/dL (ref 6–20)
CO2: 24 mmol/L (ref 22–32)
Calcium: 9.3 mg/dL (ref 8.9–10.3)
Chloride: 99 mmol/L — ABNORMAL LOW (ref 101–111)
Creatinine, Ser: 0.73 mg/dL (ref 0.44–1.00)
GFR calc Af Amer: >60 mL/min (ref >60)
GLUCOSE: 89 mg/dL (ref 65–99)
Potassium: 4.1 mmol/L (ref 3.5–5.1)
Sodium: 133 mmol/L — ABNORMAL LOW (ref 135–145)

## 2017-08-20 LAB — CBC WITH DIFFERENTIAL/PLATELET
Basophils Absolute: 0 10*3/uL (ref 0–0.1)
Basophils Relative: 0 %
EOS PCT: 2 %
Eosinophils Absolute: 0.1 10*3/uL (ref 0–0.7)
HCT: 36 % (ref 35.0–47.0)
Hemoglobin: 12.7 g/dL (ref 12.0–16.0)
LYMPHS ABS: 0.8 10*3/uL — AB (ref 1.0–3.6)
Lymphocytes Relative: 12 %
MCH: 35 pg — AB (ref 26.0–34.0)
MCHC: 35.4 g/dL (ref 32.0–36.0)
MCV: 99 fL (ref 80.0–100.0)
MONO ABS: 0.7 10*3/uL (ref 0.2–0.9)
Monocytes Relative: 10 %
Neutro Abs: 5.3 10*3/uL (ref 1.4–6.5)
Neutrophils Relative %: 76 %
PLATELETS: 172 10*3/uL (ref 150–440)
RBC: 3.64 MIL/uL — ABNORMAL LOW (ref 3.80–5.20)
RDW: 11.7 % (ref 11.5–14.5)
WBC: 6.9 10*3/uL (ref 3.6–11.0)

## 2017-08-20 NOTE — Progress Notes (Signed)
Pt in for follow up reports "doing well", reports still doesn't have any taste.  Saw ENT recently.  Denies any concerns today.

## 2017-10-01 DIAGNOSIS — Z23 Encounter for immunization: Secondary | ICD-10-CM | POA: Diagnosis not present

## 2017-10-01 DIAGNOSIS — Z0000 Encounter for general adult medical examination without abnormal findings: Secondary | ICD-10-CM | POA: Diagnosis not present

## 2017-10-01 DIAGNOSIS — F411 Generalized anxiety disorder: Secondary | ICD-10-CM | POA: Diagnosis not present

## 2017-10-15 DIAGNOSIS — L661 Lichen planopilaris: Secondary | ICD-10-CM | POA: Diagnosis not present

## 2017-12-20 IMAGING — CT CT NECK W/ CM
2 of 3 series · 7 of 14 positions shown, 8 images · IV contrast (iopamidol)
Comparison: PET 07/17/2016.  CT neck 02/22/2016

CLINICAL DATA: Tonsillar squamous cell carcinoma post treatment
follow-up

EXAM:
CT NECK WITH CONTRAST
TECHNIQUE: Multidetector CT imaging of the neck was performed using the
standard protocol following the bolus administration of intravenous
contrast.
CONTRAST:  75mL GA0A87-EXX IOPAMIDOL (GA0A87-EXX) INJECTION 61%

[Series 2: axial neck · axial · 0.66mm/px · z∈[-288,-164]mm · 3 of 126 slices shown]
[im 32/126  bone]
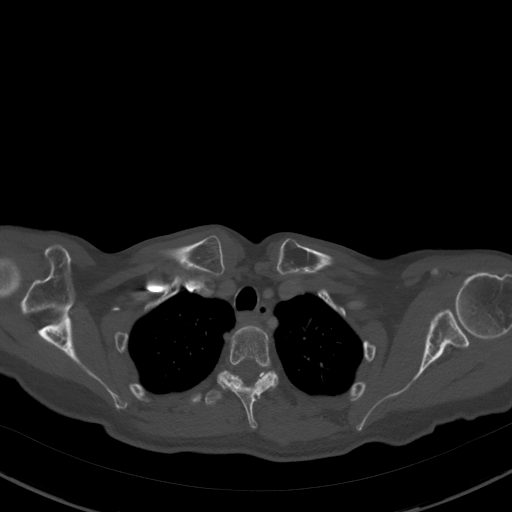
[im 63/126  bone]
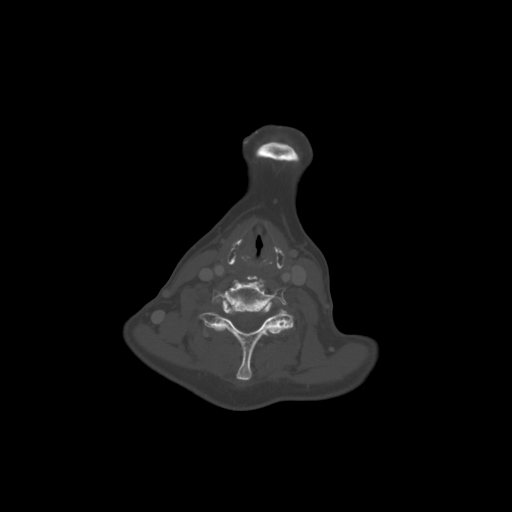
[im 94/126  bone]
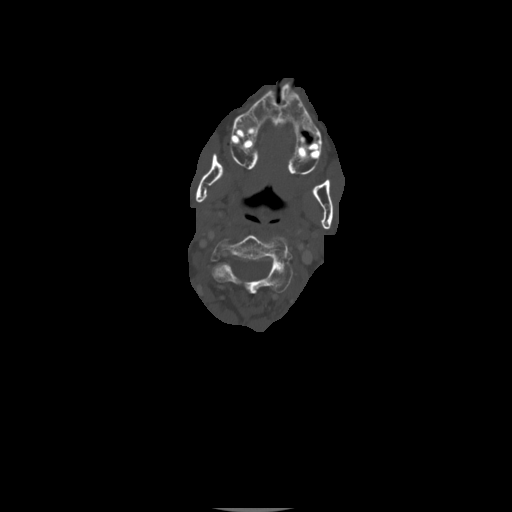

[Series 7: orthogonal ax · axial · 0.50mm/px · z∈[-357,-191]mm · 4 of 149 slices shown, 5 images]
[im 30/149  soft-tissue]
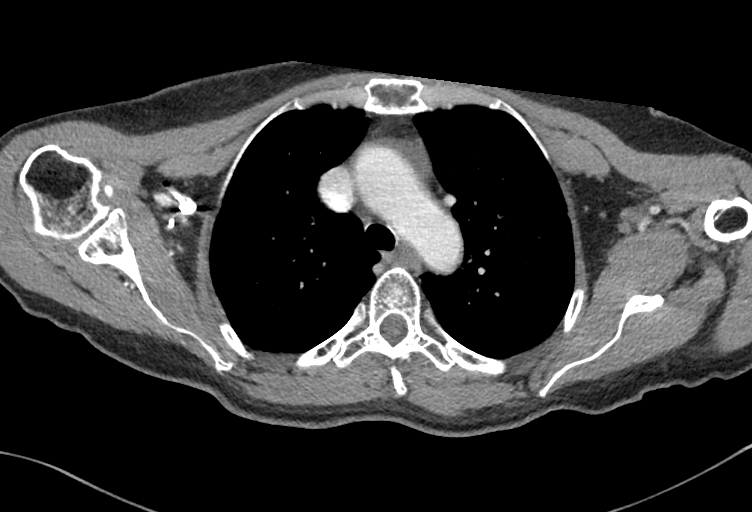
[im 30/149  bone]
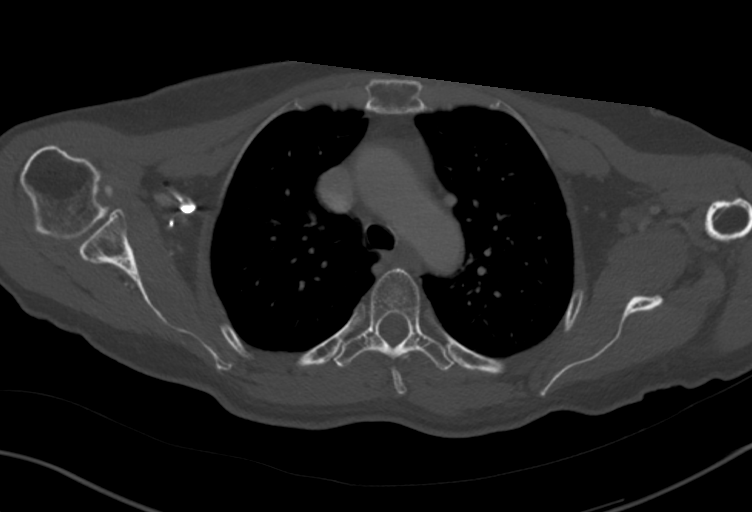
[im 60/149  bone]
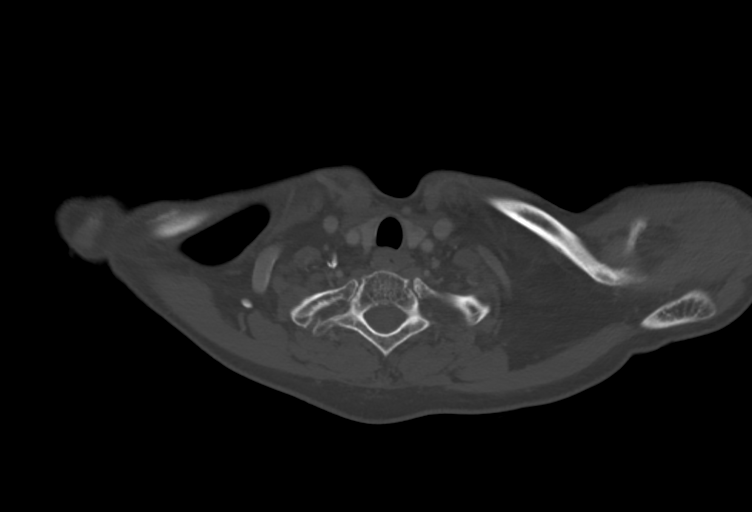
[im 89/149  bone]
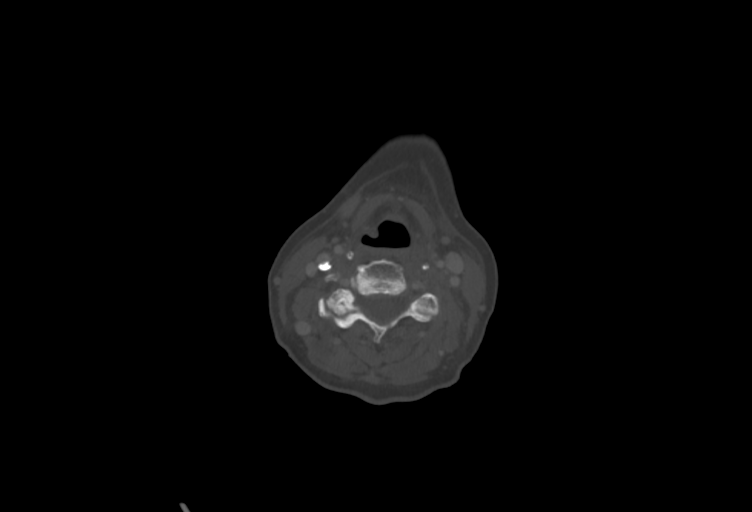
[im 119/149  bone]
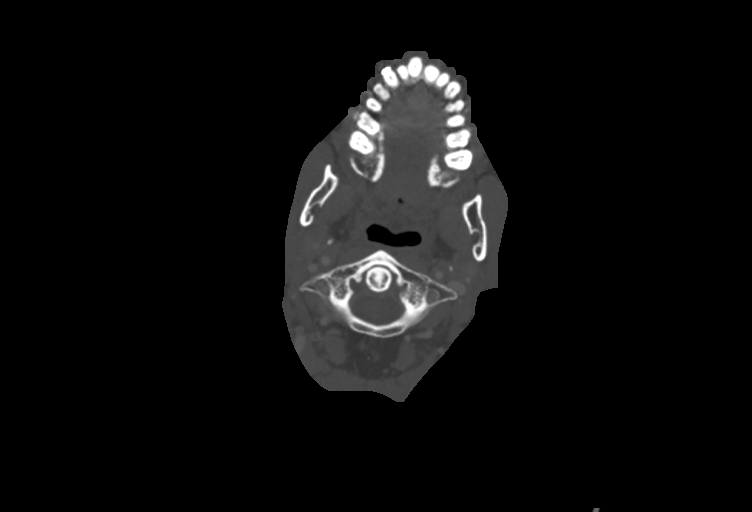

[7 of 14 positions shown; findings below may reference images not displayed]

FINDINGS: Pharynx and larynx: Left tonsillar mass has resolved. Mild edema of
the larynx and epiglottis. Airway intact.

Salivary glands: No inflammation, mass, or stone.

Thyroid: Negative

Lymph nodes: No pathologic lymph nodes in the neck. Left
retropharyngeal node has resolved. Multiple enlarged left neck nodes
have resolved.

Vascular: Extensive atherosclerotic calcification right carotid
bifurcation, likely with significant stenosis. Mild atherosclerotic
disease left carotid bifurcation

Limited intracranial: Negative

Visualized orbits: Negative

Mastoids and visualized paranasal sinuses: Mild mucosal edema in the
maxillary sinus bilaterally. Otherwise negative

Skeleton: Disc and facet degeneration in the cervical spine. No
acute skeletal abnormality.

Upper chest: Lung apices clear

Other: None
IMPRESSION: Interval resolution of left tonsillar mass and left neck adenopathy.
No recurrent tumor or adenopathy on today's study

Atherosclerotic calcification the carotid bifurcation. There is
probable significant stenosis on the right. Correlate with Doppler
study.

## 2017-12-24 DIAGNOSIS — R070 Pain in throat: Secondary | ICD-10-CM | POA: Diagnosis not present

## 2017-12-24 DIAGNOSIS — J300 Vasomotor rhinitis: Secondary | ICD-10-CM | POA: Diagnosis not present

## 2018-01-29 DIAGNOSIS — L661 Lichen planopilaris: Secondary | ICD-10-CM | POA: Diagnosis not present

## 2018-02-22 NOTE — Progress Notes (Signed)
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  INTERVAL HISTORY: Patient returns to clinic today for routine 59-month evaluation.  She continues to feel well and remains asymptomatic.  She has a chronic dry mouth, but otherwise feels well.  She denies any pain or dysphasia.  She has good appetite and denies weight loss. She has no neurologic complaints. She denies any recent fevers. She has no chest pain or shortness of breath. She denies any nausea, vomiting, constipation, or diarrhea. She has no urinary complaints.  Patient feels at her baseline and offers no further specific complaints today.  REVIEW OF SYSTEMS:   Review of Systems  Constitutional: Negative.  Negative for fever, malaise/fatigue and weight loss.  HENT: Negative.  Negative for ear pain, hearing loss and sore throat.   Respiratory: Negative.  Negative for cough and shortness of breath.   Cardiovascular: Negative.  Negative for chest pain and leg swelling.  Gastrointestinal: Negative.  Negative for abdominal pain, constipation and nausea.  Genitourinary: Negative.  Negative for dysuria.  Musculoskeletal: Negative.  Negative for neck pain.  Neurological: Negative.  Negative for tingling, sensory change, weakness and headaches.  Psychiatric/Behavioral: Negative.  The patient is not nervous/anxious and does not have insomnia.     As per HPI. Otherwise, a complete review of systems is negative.  PAST MEDICAL HISTORY: Past Medical History:  Diagnosis Date  . Arthritis   . Cancer ([place]) 03/13/2016   Malignant neoplasm of tonsil  .  Hypertension   . Postmenopausal     PAST SURGICAL HISTORY: Negative.  FAMILY HISTORY: Negative and noncontributory. No reported history of malignancy or chronic disease.  ADVANCED DIRECTIVES (Y/N):  N  HEALTH MAINTENANCE: Social History   Tobacco Use  . Smoking status: Never Smoker  . Smokeless tobacco: Never Used  Substance Use Topics  . Alcohol use: Not on file  . Drug use: Not on file     Colonoscopy:  PAP:  Bone density:  Lipid panel:  Allergies  Allergen Reactions  . Pioglitazone Swelling    Fluid retention    Current Outpatient Medications  Medication Sig Dispense Refill  . alendronate (FOSAMAX) 70 MG tablet Take 70 mg by mouth once a week. Take with a full glass of water on an empty stomach.    . ALPRAZolam (XANAX) 0.25 MG tablet Take by mouth.    Marland Kitchen amLODipine (NORVASC) 10 MG tablet Take 5 mg by mouth daily.     . Cholecalciferol (VITAMIN D) 2000 units tablet Take 2,000 Units by mouth daily.    . clobetasol cream (TEMOVATE) 6.44 % Apply 1 application topically 4 (four) times a week.    . co-enzyme Q-10 30 MG capsule Take 100 mg by mouth daily.    . finasteride (PROSCAR) 5 MG tablet Take 5 mg by mouth daily.    Marland Kitchen lisinopril (PRINIVIL,ZESTRIL) 40 MG tablet Take 20 mg by mouth daily.     . niacin (NIASPAN) 500 MG CR tablet Take 500 mg by mouth at bedtime.    . Pioglitazone HCl (ACTOS PO) Take 15 mg by mouth daily.     . pravastatin (PRAVACHOL) 40 MG tablet Take 40 mg by mouth daily.    Marland Kitchen  Omega-3 Fatty Acids (FISH OIL OMEGA-3 PO) Take 1 capsule by mouth 4 (four) times a week.     No current facility-administered medications for this visit.     OBJECTIVE: Vitals:   02/26/18 1440  BP: 112/65  Pulse: (!) 55  Temp: (!) 97.2 F (36.2 C)     Body mass index is 22.68 kg/m.    ECOG FS:0 - Asymptomatic  General: Well-developed, well-nourished, no acute distress. Eyes: Pink conjunctiva, anicteric sclera. HEENT: Normocephalic, moist mucous membranes, clear  oropharnyx.  No palpable lymphadenopathy. Lungs: Clear to auscultation bilaterally. Heart: Regular rate and rhythm. No rubs, murmurs, or gallops. Abdomen: Soft, nontender, nondistended. No organomegaly noted, normoactive bowel sounds. Musculoskeletal: No edema, cyanosis, or clubbing. Neuro: Alert, answering all questions appropriately. Cranial nerves grossly intact. Skin: No rashes or petechiae noted. Psych: Normal affect.  LAB RESULTS:  Lab Results  Component Value Date   NA 137 02/26/2018   K 4.0 02/26/2018   CL 99 02/26/2018   CO2 25 02/26/2018   GLUCOSE 88 02/26/2018   BUN 16 02/26/2018   CREATININE 0.79 02/26/2018   CALCIUM 8.9 02/26/2018   PROT 6.7 10/23/2016   ALBUMIN 4.7 10/23/2016   AST 33 10/23/2016   ALT 29 10/23/2016   ALKPHOS 52 10/23/2016   BILITOT 0.9 10/23/2016   GFRNONAA >60 02/26/2018   GFRAA >60 02/26/2018    Lab Results  Component Value Date   WBC 4.9 02/26/2018   NEUTROABS 3.5 02/26/2018   HGB 12.2 02/26/2018   HCT 36.5 02/26/2018   MCV 100.6 (H) 02/26/2018   PLT 165 02/26/2018     STUDIES: No results found.  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  PLAN:    1. 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  Patient completed her weekly chemotherapy with cisplatin along with daily XRT on approximately April 30, 2016.  CT scan results from January 20, 2017 reviewed independently with no evidence of recurrent or progressive disease. No further intervention is needed at this time.  Patient does not require additional imaging unless there is suspicion of recurrence.  Patient reports a normal endoscopy examine recently and continues to follow-up with ENT approximately every 6 months.  Return to clinic in 1 year for repeat laboratory work and further evaluation.   2.  Hyponatremia: Resolved.  I spent a total of 20 minutes face-to-face with the patient of which  greater than 50% of the visit was spent in counseling and coordination of care as detailed above.   Patient expressed understanding and was in agreement with this plan. She also understands that She can call clinic at any time with any questions, concerns, or complaints.    Lloyd Huger, MD 02/28/18 8:30 AM

## 2018-02-25 ENCOUNTER — Other Ambulatory Visit: Payer: Self-pay | Admitting: *Deleted

## 2018-02-25 DIAGNOSIS — C099 Malignant neoplasm of tonsil, unspecified: Secondary | ICD-10-CM

## 2018-02-26 ENCOUNTER — Inpatient Hospital Stay: Payer: PPO | Admitting: Oncology

## 2018-02-26 ENCOUNTER — Other Ambulatory Visit: Payer: Self-pay

## 2018-02-26 ENCOUNTER — Inpatient Hospital Stay: Payer: PPO | Attending: Oncology

## 2018-02-26 VITALS — BP 112/65 | HR 55 | Temp 97.2°F | Ht 65.5 in | Wt 138.4 lb

## 2018-02-26 DIAGNOSIS — R682 Dry mouth, unspecified: Secondary | ICD-10-CM

## 2018-02-26 DIAGNOSIS — C099 Malignant neoplasm of tonsil, unspecified: Secondary | ICD-10-CM

## 2018-02-26 DIAGNOSIS — B977 Papillomavirus as the cause of diseases classified elsewhere: Secondary | ICD-10-CM

## 2018-02-26 LAB — CBC WITH DIFFERENTIAL/PLATELET
Abs Immature Granulocytes: 0.01 10*3/uL (ref 0.00–0.07)
Basophils Absolute: 0 10*3/uL (ref 0.0–0.1)
Basophils Relative: 0 %
Eosinophils Absolute: 0.1 10*3/uL (ref 0.0–0.5)
Eosinophils Relative: 2 %
HCT: 36.5 % (ref 36.0–46.0)
HEMOGLOBIN: 12.2 g/dL (ref 12.0–15.0)
Immature Granulocytes: 0 %
Lymphocytes Relative: 17 %
Lymphs Abs: 0.8 10*3/uL (ref 0.7–4.0)
MCH: 33.6 pg (ref 26.0–34.0)
MCHC: 33.4 g/dL (ref 30.0–36.0)
MCV: 100.6 fL — AB (ref 80.0–100.0)
MONOS PCT: 10 %
Monocytes Absolute: 0.5 10*3/uL (ref 0.1–1.0)
Neutro Abs: 3.5 10*3/uL (ref 1.7–7.7)
Neutrophils Relative %: 71 %
Platelets: 165 10*3/uL (ref 150–400)
RBC: 3.63 MIL/uL — ABNORMAL LOW (ref 3.87–5.11)
RDW: 11.8 % (ref 11.5–15.5)
WBC: 4.9 10*3/uL (ref 4.0–10.5)
nRBC: 0 % (ref 0.0–0.2)

## 2018-02-26 LAB — BASIC METABOLIC PANEL
Anion gap: 13 (ref 5–15)
BUN: 16 mg/dL (ref 8–23)
CHLORIDE: 99 mmol/L (ref 98–111)
CO2: 25 mmol/L (ref 22–32)
CREATININE: 0.79 mg/dL (ref 0.44–1.00)
Calcium: 8.9 mg/dL (ref 8.9–10.3)
GFR calc Af Amer: >60 mL/min (ref >60)
GFR calc non Af Amer: >60 mL/min (ref >60)
GLUCOSE: 88 mg/dL (ref 70–99)
Potassium: 4 mmol/L (ref 3.5–5.1)
Sodium: 137 mmol/L (ref 135–145)

## 2018-02-26 NOTE — Progress Notes (Signed)
Patient is here today to follow up on her tonsillar cancer. Patient stated that she had been doing well.

## 2018-03-19 DIAGNOSIS — L661 Lichen planopilaris: Secondary | ICD-10-CM | POA: Diagnosis not present

## 2018-06-03 ENCOUNTER — Ambulatory Visit: Payer: PPO | Admitting: Radiation Oncology

## 2018-07-13 ENCOUNTER — Ambulatory Visit: Payer: PPO | Admitting: Radiation Oncology

## 2018-08-19 DIAGNOSIS — L661 Lichen planopilaris: Secondary | ICD-10-CM | POA: Diagnosis not present

## 2018-09-03 DIAGNOSIS — R682 Dry mouth, unspecified: Secondary | ICD-10-CM | POA: Diagnosis not present

## 2018-09-03 DIAGNOSIS — J300 Vasomotor rhinitis: Secondary | ICD-10-CM | POA: Diagnosis not present

## 2018-12-15 ENCOUNTER — Other Ambulatory Visit: Payer: Self-pay

## 2018-12-17 ENCOUNTER — Ambulatory Visit (INDEPENDENT_AMBULATORY_CARE_PROVIDER_SITE_OTHER): Payer: PPO | Admitting: Internal Medicine

## 2018-12-17 ENCOUNTER — Encounter: Payer: Self-pay | Admitting: Internal Medicine

## 2018-12-17 ENCOUNTER — Other Ambulatory Visit: Payer: Self-pay

## 2018-12-17 VITALS — BP 130/80 | HR 61 | Temp 98.3°F | Ht 65.5 in | Wt 138.4 lb

## 2018-12-17 DIAGNOSIS — Z1329 Encounter for screening for other suspected endocrine disorder: Secondary | ICD-10-CM | POA: Diagnosis not present

## 2018-12-17 DIAGNOSIS — Z13818 Encounter for screening for other digestive system disorders: Secondary | ICD-10-CM

## 2018-12-17 DIAGNOSIS — Z1231 Encounter for screening mammogram for malignant neoplasm of breast: Secondary | ICD-10-CM | POA: Diagnosis not present

## 2018-12-17 DIAGNOSIS — F419 Anxiety disorder, unspecified: Secondary | ICD-10-CM

## 2018-12-17 DIAGNOSIS — Z1389 Encounter for screening for other disorder: Secondary | ICD-10-CM | POA: Diagnosis not present

## 2018-12-17 DIAGNOSIS — I6521 Occlusion and stenosis of right carotid artery: Secondary | ICD-10-CM

## 2018-12-17 DIAGNOSIS — I10 Essential (primary) hypertension: Secondary | ICD-10-CM | POA: Diagnosis not present

## 2018-12-17 DIAGNOSIS — Z1159 Encounter for screening for other viral diseases: Secondary | ICD-10-CM

## 2018-12-17 DIAGNOSIS — K224 Dyskinesia of esophagus: Secondary | ICD-10-CM

## 2018-12-17 DIAGNOSIS — E785 Hyperlipidemia, unspecified: Secondary | ICD-10-CM | POA: Diagnosis not present

## 2018-12-17 DIAGNOSIS — C801 Malignant (primary) neoplasm, unspecified: Secondary | ICD-10-CM | POA: Diagnosis not present

## 2018-12-17 DIAGNOSIS — M2550 Pain in unspecified joint: Secondary | ICD-10-CM

## 2018-12-17 DIAGNOSIS — M791 Myalgia, unspecified site: Secondary | ICD-10-CM

## 2018-12-17 DIAGNOSIS — M810 Age-related osteoporosis without current pathological fracture: Secondary | ICD-10-CM

## 2018-12-17 DIAGNOSIS — M25552 Pain in left hip: Secondary | ICD-10-CM

## 2018-12-17 DIAGNOSIS — E559 Vitamin D deficiency, unspecified: Secondary | ICD-10-CM

## 2018-12-17 DIAGNOSIS — H269 Unspecified cataract: Secondary | ICD-10-CM

## 2018-12-17 DIAGNOSIS — M25551 Pain in right hip: Secondary | ICD-10-CM

## 2018-12-17 DIAGNOSIS — L661 Lichen planopilaris: Secondary | ICD-10-CM

## 2018-12-17 MED ORDER — PRAVASTATIN SODIUM 40 MG PO TABS: 40.0000 mg | ORAL_TABLET | Freq: Every day | ORAL | 3 refills | Status: DC

## 2018-12-17 MED ORDER — ALPRAZOLAM 0.5 MG PO TABS: 0.5000 mg | ORAL_TABLET | Freq: Every day | ORAL | 5 refills | Status: DC | PRN

## 2018-12-17 MED ORDER — AMLODIPINE BESYLATE 5 MG PO TABS: 5.0000 mg | ORAL_TABLET | Freq: Every day | ORAL | 3 refills | Status: DC

## 2018-12-17 MED ORDER — LISINOPRIL 40 MG PO TABS: 20.0000 mg | ORAL_TABLET | Freq: Every day | ORAL | 3 refills | Status: DC

## 2018-12-17 MED ORDER — NIACIN ER (ANTIHYPERLIPIDEMIC) 500 MG PO TBCR: 500.0000 mg | EXTENDED_RELEASE_TABLET | Freq: Every day | ORAL | 3 refills | Status: DC

## 2018-12-17 NOTE — Patient Instructions (Addendum)
Glucose tablets    Consider prevnar and shingrix vaccine x 2 doses   Stress relax brand L theanine stress/anxiety Aromatherapy  Deep breaths  Meditation   Pneumococcal Conjugate Vaccine suspension for injection What is this medicine? PNEUMOCOCCAL VACCINE (NEU mo KOK al vak SEEN) is a vaccine used to prevent pneumococcus bacterial infections. These bacteria can cause serious infections like pneumonia, meningitis, and blood infections. This vaccine will lower your chance of getting pneumonia. If you do get pneumonia, it can make your symptoms milder and your illness shorter. This vaccine will not treat an infection and will not cause infection. This vaccine is recommended for infants and young children, adults with certain medical conditions, and adults 63 years or older. This medicine may be used for other purposes; ask your health care provider or pharmacist if you have questions. COMMON BRAND NAME(S): Prevnar, Prevnar 13 What should I tell my health care provider before I take this medicine? They need to know if you have any of these conditions:  bleeding problems  fever  immune system problems  an unusual or allergic reaction to pneumococcal vaccine, diphtheria toxoid, other vaccines, latex, other medicines, foods, dyes, or preservatives  pregnant or trying to get pregnant  breast-feeding How should I use this medicine? This vaccine is for injection into a muscle. It is given by a health care professional. A copy of Vaccine Information Statements will be given before each vaccination. Read this sheet carefully each time. The sheet may change frequently. Talk to your pediatrician regarding the use of this medicine in children. While this drug may be prescribed for children as young as 90 weeks old for selected conditions, precautions do apply. Overdosage: If you think you have taken too much of this medicine contact a poison control center or emergency room at once. NOTE: This  medicine is only for you. Do not share this medicine with others. What if I miss a dose? It is important not to miss your dose. Call your doctor or health care professional if you are unable to keep an appointment. What may interact with this medicine?  medicines for cancer chemotherapy  medicines that suppress your immune function  steroid medicines like prednisone or cortisone This list may not describe all possible interactions. Give your health care provider a list of all the medicines, herbs, non-prescription drugs, or dietary supplements you use. Also tell them if you smoke, drink alcohol, or use illegal drugs. Some items may interact with your medicine. What should I watch for while using this medicine? Mild fever and pain should go away in 3 days or less. Report any unusual symptoms to your doctor or health care professional. What side effects may I notice from receiving this medicine? Side effects that you should report to your doctor or health care professional as soon as possible:  allergic reactions like skin rash, itching or hives, swelling of the face, lips, or tongue  breathing problems  confused  fast or irregular heartbeat  fever over 102 degrees F  seizures  unusual bleeding or bruising  unusual muscle weakness Side effects that usually do not require medical attention (report to your doctor or health care professional if they continue or are bothersome):  aches and pains  diarrhea  fever of 102 degrees F or less  headache  irritable  loss of appetite  pain, tender at site where injected  trouble sleeping This list may not describe all possible side effects. Call your doctor for medical advice about side effects.  You may report side effects to FDA at 1-800-FDA-1088. Where should I keep my medicine? This does not apply. This vaccine is given in a clinic, pharmacy, doctor's office, or other health care setting and will not be stored at home. NOTE:  This sheet is a summary. It may not cover all possible information. If you have questions about this medicine, talk to your doctor, pharmacist, or health care provider.  2020 Elsevier/Gold Standard (2013-12-02 10:27:27)  Zoster Vaccine, Recombinant injection What is this medicine? ZOSTER VACCINE (ZOS ter vak SEEN) is used to prevent shingles in adults 67 years old and over. This vaccine is not used to treat shingles or nerve pain from shingles. This medicine may be used for other purposes; ask your health care provider or pharmacist if you have questions. COMMON BRAND NAME(S): [facility] What should I tell my health care provider before I take this medicine? They need to know if you have any of these conditions:  blood disorders or disease  cancer like leukemia or lymphoma  immune system problems or therapy  an unusual or allergic reaction to vaccines, other medications, foods, dyes, or preservatives  pregnant or trying to get pregnant  breast-feeding How should I use this medicine? This vaccine is for injection in a muscle. It is given by a health care professional. Talk to your pediatrician regarding the use of this medicine in children. This medicine is not approved for use in children. Overdosage: If you think you have taken too much of this medicine contact a poison control center or emergency room at once. NOTE: This medicine is only for you. Do not share this medicine with others. What if I miss a dose? Keep appointments for follow-up (booster) doses as directed. It is important not to miss your dose. Call your doctor or health care professional if you are unable to keep an appointment. What may interact with this medicine?  medicines that suppress your immune system  medicines to treat cancer  steroid medicines like prednisone or cortisone This list may not describe all possible interactions. Give your health care provider a list of all the medicines, herbs,  non-prescription drugs, or dietary supplements you use. Also tell them if you smoke, drink alcohol, or use illegal drugs. Some items may interact with your medicine. What should I watch for while using this medicine? Visit your doctor for regular check ups. This vaccine, like all vaccines, may not fully protect everyone. What side effects may I notice from receiving this medicine? Side effects that you should report to your doctor or health care professional as soon as possible:  allergic reactions like skin rash, itching or hives, swelling of the face, lips, or tongue  breathing problems Side effects that usually do not require medical attention (report these to your doctor or health care professional if they continue or are bothersome):  chills  headache  fever  nausea, vomiting  redness, warmth, pain, swelling or itching at site where injected  tiredness This list may not describe all possible side effects. Call your doctor for medical advice about side effects. You may report side effects to FDA at 1-800-FDA-1088. Where should I keep my medicine? This vaccine is only given in a clinic, pharmacy, doctor's office, or other health care setting and will not be stored at home. NOTE: This sheet is a summary. It may not cover all possible information. If you have questions about this medicine, talk to your doctor, pharmacist, or health care provider.  2020 Elsevier/Gold Standard (2016-10-07  13:20:30)  Pneumococcal Conjugate Vaccine suspension for injection What is this medicine? PNEUMOCOCCAL VACCINE (NEU mo KOK al vak SEEN) is a vaccine used to prevent pneumococcus bacterial infections. These bacteria can cause serious infections like pneumonia, meningitis, and blood infections. This vaccine will lower your chance of getting pneumonia. If you do get pneumonia, it can make your symptoms milder and your illness shorter. This vaccine will not treat an infection and will not cause infection.  This vaccine is recommended for infants and young children, adults with certain medical conditions, and adults 55 years or older. This medicine may be used for other purposes; ask your health care provider or pharmacist if you have questions. COMMON BRAND NAME(S): Prevnar, Prevnar 13 What should I tell my health care provider before I take this medicine? They need to know if you have any of these conditions:  bleeding problems  fever  immune system problems  an unusual or allergic reaction to pneumococcal vaccine, diphtheria toxoid, other vaccines, latex, other medicines, foods, dyes, or preservatives  pregnant or trying to get pregnant  breast-feeding How should I use this medicine? This vaccine is for injection into a muscle. It is given by a health care professional. A copy of Vaccine Information Statements will be given before each vaccination. Read this sheet carefully each time. The sheet may change frequently. Talk to your pediatrician regarding the use of this medicine in children. While this drug may be prescribed for children as young as 34 weeks old for selected conditions, precautions do apply. Overdosage: If you think you have taken too much of this medicine contact a poison control center or emergency room at once. NOTE: This medicine is only for you. Do not share this medicine with others. What if I miss a dose? It is important not to miss your dose. Call your doctor or health care professional if you are unable to keep an appointment. What may interact with this medicine?  medicines for cancer chemotherapy  medicines that suppress your immune function  steroid medicines like prednisone or cortisone This list may not describe all possible interactions. Give your health care provider a list of all the medicines, herbs, non-prescription drugs, or dietary supplements you use. Also tell them if you smoke, drink alcohol, or use illegal drugs. Some items may interact with your  medicine. What should I watch for while using this medicine? Mild fever and pain should go away in 3 days or less. Report any unusual symptoms to your doctor or health care professional. What side effects may I notice from receiving this medicine? Side effects that you should report to your doctor or health care professional as soon as possible:  allergic reactions like skin rash, itching or hives, swelling of the face, lips, or tongue  breathing problems  confused  fast or irregular heartbeat  fever over 102 degrees F  seizures  unusual bleeding or bruising  unusual muscle weakness Side effects that usually do not require medical attention (report to your doctor or health care professional if they continue or are bothersome):  aches and pains  diarrhea  fever of 102 degrees F or less  headache  irritable  loss of appetite  pain, tender at site where injected  trouble sleeping This list may not describe all possible side effects. Call your doctor for medical advice about side effects. You may report side effects to FDA at 1-800-FDA-1088. Where should I keep my medicine? This does not apply. This vaccine is given in a clinic,  pharmacy, doctor's office, or other health care setting and will not be stored at home. NOTE: This sheet is a summary. It may not cover all possible information. If you have questions about this medicine, talk to your doctor, pharmacist, or health care provider.  2020 Elsevier/Gold Standard (2013-12-02 10:27:27)

## 2018-12-17 NOTE — Progress Notes (Signed)
Chief Complaint  Patient presents with  . Establish Care   New patient  1. 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 Tami Ribas and Dr. Grayland Ormond though she is not sure if will see h/o 02/2019 she will f/u with Dr. Tami Ribas in 02/2019 and for 3 years then been f/u prn  C/o dry throat after radiation and she drinks a lot of water and has to urinate all the time   2. C/o hypoglycemia at times  3. H/o esophageal spasm on norvasc 5 mg per pt  4. HTN BP controlled today on norvasc 5 mg qd, 10 mg dropped BP too low she also takes lis 20-40 mg qd at home BP 98-111/60s 5. LPP to scalp on naltrexone 3 mg qd, clobetasol 0.05%, finesteride 5 mg qd  6. Neck imaging noted right CAS asked if pt wanted to do carotid US b/l declines for now  7. Anxiety increased and xanax 0.25 qd prn is not helping like a water pill   Review of Systems  Constitutional: Negative for weight loss.  HENT: Negative for hearing loss.        +dry mouth/throat s/p radiation for tonsillar ca   Eyes: Negative for blurred vision.  Respiratory: Negative for shortness of breath.   Cardiovascular: Negative for chest pain.  Gastrointestinal: Negative for abdominal pain.  Musculoskeletal: Negative for falls.  Skin:       LPP scalp   Neurological: Negative for headaches.  Psychiatric/Behavioral: The patient is nervous/anxious.    Past Medical History:  Diagnosis Date  . Anxiety   . Arthritis   . Asthma   . 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 Tami Ribas   . Hyperlipidemia   . Hypertension   . Postmenopausal    Past Surgical History:  Procedure Laterality Date  . left tonsillar surgery     02/26/16 invasive moderate SCC   Family History  Problem Relation Age of Onset  . Other Mother        legally blind  . Hearing loss Mother   . Heart disease Mother        MI  . Hyperlipidemia Mother   . Hypertension Mother   . Kidney disease Mother   . Cancer Father    lung smoker   Social History   Socioeconomic History  . Marital status: Married    Spouse name: Not on file  . Number of children: Not on file  . Years of education: Not on file  . Highest education level: Not on file  Occupational History  . Not on file  Social Needs  . Financial resource strain: Not on file  . Food insecurity    Worry: Not on file    Inability: Not on file  . Transportation needs    Medical: Not on file    Non-medical: Not on file  Tobacco Use  . Smoking status: Never Smoker  . Smokeless tobacco: Never Used  Substance and Sexual Activity  . Alcohol use: Not Currently  . Drug use: Not Currently  . Sexual activity: Not Currently  Lifestyle  . Physical activity    Days per week: Not on file    Minutes per session: Not on file  . Stress: Not on file  Relationships  . Social Herbalist on phone: Not on file    Gets together: Not on file    Attends religious service: Not on file  Active member of club or organization: Not on file    Attends meetings of clubs or organizations: Not on file    Relationship status: Not on file  . Intimate partner violence    Fear of current or ex partner: Not on file    Emotionally abused: Not on file    Physically abused: Not on file    Forced sexual activity: Not on file  Other Topics Concern  . 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 alendronate (FOSAMAX) 70 MG tablet Take 70 mg by mouth once a week. Take with a full glass of water on an empty stomach.  . ALPRAZolam (XANAX) 0.5 MG tablet Take 1 tablet (0.5 mg total) by mouth daily as needed for anxiety.  Marland Kitchen amLODipine (NORVASC) 5 MG tablet Take 1 tablet (5 mg total) by mouth daily.  . Cholecalciferol (VITAMIN D) 2000 units tablet Take 2,000 Units by mouth daily.  . clobetasol (TEMOVATE) 0.05 %  external solution Apply 1 application topically 2 (two) times daily.  . clobetasol cream (TEMOVATE) [id number] % Apply 1 application topically 4 (four) times a week.  . finasteride (PROSCAR) 5 MG tablet Take 5 mg by mouth daily.  Marland Kitchen ipratropium (ATROVENT) 0.06 % nasal spray Place 2 sprays into both nostrils 4 (four) times daily.  Marland Kitchen lisinopril (ZESTRIL) 40 MG tablet Take 0.5 tablets (20 mg total) by mouth daily.  Marland Kitchen NALTREXONE HCL PO Take 3 mg by mouth daily.  . niacin (NIASPAN) 500 MG CR tablet Take 1 tablet (500 mg total) by mouth at bedtime.  . pravastatin (PRAVACHOL) 40 MG tablet Take 1 tablet (40 mg total) by mouth daily.  . [DISCONTINUED] ALPRAZolam (XANAX) 0.25 MG tablet Take by mouth.  . [DISCONTINUED] amLODipine (NORVASC) 10 MG tablet Take 5 mg by mouth daily.   . [DISCONTINUED] lisinopril (PRINIVIL,ZESTRIL) 40 MG tablet Take 20 mg by mouth daily.   . [DISCONTINUED] niacin (NIASPAN) 500 MG CR tablet Take 500 mg by mouth at bedtime.  . [DISCONTINUED] pravastatin (PRAVACHOL) 40 MG tablet Take 40 mg by mouth daily.   Allergies  Allergen Reactions  . Pioglitazone Swelling    Fluid retention   No results found for this or any previous visit (from the past 2160 hour(s)). Objective  Body mass index is 22.68 kg/m. Wt Readings from Last 3 Encounters:  12/17/18 138 lb 6.4 oz (62.8 kg)  02/26/18 138 lb 6.4 oz (62.8 kg)  08/20/17 134 lb 3 oz (60.9 kg)   Temp Readings from Last 3 Encounters:  12/17/18 98.3 F (36.8 C) (Oral)  02/26/18 (!) 97.2 F (36.2 C) (Tympanic)  08/20/17 98 F (36.7 C) (Tympanic)   BP Readings from Last 3 Encounters:  12/17/18 130/80  02/26/18 112/65  08/20/17 106/63   Pulse Readings from Last 3 Encounters:  12/17/18 61  02/26/18 (!) 55  08/20/17 67    Physical Exam Vitals signs and nursing note reviewed.  Constitutional:      Appearance: Normal appearance. She is well-developed and well-groomed.     Comments: +mask on    HENT:     Head: Normocephalic  and atraumatic.  Eyes:     Conjunctiva/sclera: Conjunctivae normal.     Pupils: Pupils are equal, round, and reactive to light.  Cardiovascular:     Rate and Rhythm: Normal rate and regular rhythm.     Heart sounds: Normal heart sounds. No murmur.  Pulmonary:     Effort: Pulmonary effort is normal.     Breath sounds: Normal breath sounds.  Skin:    General: Skin is warm and dry.  Neurological:     General: No focal deficit present.     Mental Status: She is alert and oriented to person, place, and time. Mental status is at baseline.     Gait: Gait normal.  Psychiatric:        Attention and Perception: Attention and perception normal.        Mood and Affect: Mood and affect normal.        Speech: Speech normal.        Behavior: Behavior normal. Behavior is cooperative.        Thought Content: Thought content normal.        Cognition and Memory: Cognition and memory normal.        Judgment: Judgment normal.     Assessment  Plan  Essential hypertension - Plan: lisinopril (ZESTRIL) 40 MG tablet (taking 20-40 mg qd), amLODipine (NORVASC) 5 MG tablet, Comprehensive metabolic panel, CBC w/Diff, Lipid panel  Osteoporosis, unspecified osteoporosis type, unspecified pathological fracture presence - Plan: DG Bone Density, Vitamin D (25 hydroxy) -disc prolia if dexa not better   Anxiety - Plan: ALPRAZolam (XANAX) 0.5 MG tablet increased from 0.25   Hyperlipidemia, unspecified hyperlipidemia type - Plan: niacin (NIASPAN) 500 MG CR tablet  Stenosis of right carotid artery - Plan: pravastatin (PRAVACHOL) 40 MG tablet Pt wants to hold on CAS [place] b/l for now   Cancer [facility]), tonsillar mod SCC -f/u Dr. Tami Ribas 02/2019 last year of f/u 3 years out  She will likely not f/u with h/o   Lichen planopilaris, scalp  F/u derm Q6 months  Esophageal spasm  -cont CCB  HM Flu shot will get at drug store  Had pna 23  Given Rx prevnar  Wants to wait on shingrix  Tdap had 10/01/17   Never had  colonoscopy declines disc cologuard today wants to wait  Out of age window pap last 07/24/17 neg pap neg HPV no h/o abnormal pap  Mammogram referred  dexa 01/02/17 +osteoporosis on fosamax referred to f/u and repeat  Dermatology Dr. Clemon Chambers Q6 months no h/o nmsc  AE appt 01/2019 h/o cataracts   Dentist Dr. Jacelyn Grip  ENT Tami Ribas  H/o finnegan   Of notes gets some meds HT and others [facility]  Provider: Dr. Olivia Mackie McLean-Scocuzza-Internal Medicine

## 2018-12-18 ENCOUNTER — Encounter: Payer: Self-pay | Admitting: Internal Medicine

## 2018-12-18 DIAGNOSIS — I6521 Occlusion and stenosis of right carotid artery: Secondary | ICD-10-CM | POA: Insufficient documentation

## 2018-12-18 DIAGNOSIS — L661 Lichen planopilaris: Secondary | ICD-10-CM | POA: Insufficient documentation

## 2018-12-18 DIAGNOSIS — I10 Essential (primary) hypertension: Secondary | ICD-10-CM | POA: Insufficient documentation

## 2018-12-18 DIAGNOSIS — E785 Hyperlipidemia, unspecified: Secondary | ICD-10-CM | POA: Insufficient documentation

## 2018-12-18 DIAGNOSIS — K224 Dyskinesia of esophagus: Secondary | ICD-10-CM | POA: Insufficient documentation

## 2018-12-18 DIAGNOSIS — H269 Unspecified cataract: Secondary | ICD-10-CM | POA: Insufficient documentation

## 2018-12-29 ENCOUNTER — Other Ambulatory Visit (INDEPENDENT_AMBULATORY_CARE_PROVIDER_SITE_OTHER): Payer: PPO

## 2018-12-29 ENCOUNTER — Other Ambulatory Visit: Payer: Self-pay

## 2018-12-29 ENCOUNTER — Other Ambulatory Visit: Payer: Self-pay | Admitting: Internal Medicine

## 2018-12-29 DIAGNOSIS — Z1159 Encounter for screening for other viral diseases: Secondary | ICD-10-CM | POA: Diagnosis not present

## 2018-12-29 DIAGNOSIS — E559 Vitamin D deficiency, unspecified: Secondary | ICD-10-CM

## 2018-12-29 DIAGNOSIS — I10 Essential (primary) hypertension: Secondary | ICD-10-CM

## 2018-12-29 DIAGNOSIS — M810 Age-related osteoporosis without current pathological fracture: Secondary | ICD-10-CM

## 2018-12-29 DIAGNOSIS — Z1329 Encounter for screening for other suspected endocrine disorder: Secondary | ICD-10-CM

## 2018-12-29 DIAGNOSIS — Z13818 Encounter for screening for other digestive system disorders: Secondary | ICD-10-CM

## 2018-12-29 DIAGNOSIS — R899 Unspecified abnormal finding in specimens from other organs, systems and tissues: Secondary | ICD-10-CM

## 2018-12-29 DIAGNOSIS — Z1389 Encounter for screening for other disorder: Secondary | ICD-10-CM

## 2018-12-29 LAB — COMPREHENSIVE METABOLIC PANEL
ALT: 21 U/L (ref 0–35)
AST: 24 U/L (ref 0–37)
Albumin: 4.6 g/dL (ref 3.5–5.2)
Alkaline Phosphatase: 60 U/L (ref 39–117)
BUN: 15 mg/dL (ref 6–23)
CO2: 27 mEq/L (ref 19–32)
Calcium: 9 mg/dL (ref 8.4–10.5)
Chloride: 100 mEq/L (ref 96–112)
Creatinine, Ser: 0.81 mg/dL (ref 0.40–1.20)
GFR: 70.43 mL/min (ref >60.00)
Glucose, Bld: 81 mg/dL (ref 70–99)
Potassium: 4.1 mEq/L (ref 3.5–5.1)
Sodium: 134 mEq/L — ABNORMAL LOW (ref 135–145)
Total Bilirubin: 0.5 mg/dL (ref 0.2–1.2)
Total Protein: 6.4 g/dL (ref 6.0–8.3)

## 2018-12-29 LAB — LIPID PANEL
Cholesterol: 180 mg/dL (ref 0–200)
HDL: 73.3 mg/dL (ref >39.00)
LDL Cholesterol: 97 mg/dL (ref 0–99)
NonHDL: 106.37
Total CHOL/HDL Ratio: 2
Triglycerides: 49 mg/dL (ref 0.0–149.0)
VLDL: 9.8 mg/dL (ref 0.0–40.0)

## 2018-12-29 LAB — VITAMIN D 25 HYDROXY (VIT D DEFICIENCY, FRACTURES): VITD: 38.52 ng/mL (ref 30.00–100.00)

## 2018-12-29 LAB — CBC WITH DIFFERENTIAL/PLATELET
Basophils Absolute: 0 10*3/uL (ref 0.0–0.1)
Basophils Relative: 0.5 % (ref 0.0–3.0)
Eosinophils Absolute: 0.1 10*3/uL (ref 0.0–0.7)
Eosinophils Relative: 3.6 % (ref 0.0–5.0)
HCT: 41.2 % (ref 36.0–46.0)
Hemoglobin: 14.3 g/dL (ref 12.0–15.0)
Lymphocytes Relative: 21.7 % (ref 12.0–46.0)
Lymphs Abs: 0.8 10*3/uL (ref 0.7–4.0)
MCHC: 34.7 g/dL (ref 30.0–36.0)
MCV: 98.4 fl (ref 78.0–100.0)
Monocytes Absolute: 0.4 10*3/uL (ref 0.1–1.0)
Monocytes Relative: 10.7 % (ref 3.0–12.0)
Neutro Abs: 2.2 10*3/uL (ref 1.4–7.7)
Neutrophils Relative %: 63.5 % (ref 43.0–77.0)
Platelets: 185 10*3/uL (ref 150.0–400.0)
RBC: 4.19 Mil/uL (ref 3.87–5.11)
RDW: 12 % (ref 11.5–15.5)
WBC: 3.5 10*3/uL — ABNORMAL LOW (ref 4.0–10.5)

## 2018-12-29 LAB — TSH: TSH: 14.89 u[IU]/mL — ABNORMAL HIGH (ref 0.35–4.50)

## 2018-12-30 ENCOUNTER — Other Ambulatory Visit: Payer: Self-pay | Admitting: Internal Medicine

## 2018-12-30 ENCOUNTER — Other Ambulatory Visit (INDEPENDENT_AMBULATORY_CARE_PROVIDER_SITE_OTHER): Payer: PPO

## 2018-12-30 DIAGNOSIS — R899 Unspecified abnormal finding in specimens from other organs, systems and tissues: Secondary | ICD-10-CM | POA: Diagnosis not present

## 2018-12-30 DIAGNOSIS — R946 Abnormal results of thyroid function studies: Secondary | ICD-10-CM

## 2018-12-30 LAB — URINALYSIS, ROUTINE W REFLEX MICROSCOPIC
Bilirubin Urine: NEGATIVE
Glucose, UA: NEGATIVE
Hgb urine dipstick: NEGATIVE
Ketones, ur: NEGATIVE
Leukocytes,Ua: NEGATIVE
Nitrite: NEGATIVE
Protein, ur: NEGATIVE
Specific Gravity, Urine: 1.005 (ref 1.001–1.03)
pH: 7 (ref 5.0–8.0)

## 2018-12-30 LAB — T4, FREE: Free T4: 0.83 ng/dL (ref 0.60–1.60)

## 2018-12-30 LAB — HEPATITIS C ANTIBODY
Hepatitis C Ab: NONREACTIVE
SIGNAL TO CUT-OFF: 0 (ref <1.00)

## 2018-12-30 LAB — T3, FREE: T3, Free: 2.4 pg/mL (ref 2.3–4.2)

## 2019-01-12 ENCOUNTER — Telehealth: Payer: Self-pay | Admitting: *Deleted

## 2019-01-12 NOTE — Telephone Encounter (Signed)
Per pt request cancel 03/01/19 lab/MD appts and stated that she would call back at a later date to R/S

## 2019-01-20 DIAGNOSIS — H2513 Age-related nuclear cataract, bilateral: Secondary | ICD-10-CM | POA: Diagnosis not present

## 2019-02-09 ENCOUNTER — Other Ambulatory Visit (INDEPENDENT_AMBULATORY_CARE_PROVIDER_SITE_OTHER): Payer: PPO

## 2019-02-09 ENCOUNTER — Other Ambulatory Visit: Payer: Self-pay

## 2019-02-09 ENCOUNTER — Other Ambulatory Visit
Admission: RE | Admit: 2019-02-09 | Discharge: 2019-02-09 | Disposition: A | Discharge status: Home / Self Care | Payer: PPO | Source: Ambulatory Visit | Attending: Internal Medicine | Admitting: Internal Medicine

## 2019-02-09 ENCOUNTER — Other Ambulatory Visit: Payer: PPO

## 2019-02-09 DIAGNOSIS — R946 Abnormal results of thyroid function studies: Secondary | ICD-10-CM | POA: Diagnosis not present

## 2019-02-09 LAB — TSH: TSH: 10.047 u[IU]/mL — ABNORMAL HIGH (ref 0.350–4.500)

## 2019-02-09 NOTE — Addendum Note (Signed)
Addended by: Zannie Cove on: 02/09/2019 12:08 PM   Modules accepted: Orders

## 2019-02-09 NOTE — Addendum Note (Signed)
Addended by: Santiago Bur on: 02/09/2019 12:42 PM   Modules accepted: Orders

## 2019-02-10 LAB — THYROID PEROXIDASE ANTIBODY: Thyroperoxidase Ab SerPl-aCnc: <9 IU/mL (ref 0–34)

## 2019-02-11 ENCOUNTER — Other Ambulatory Visit: Payer: Self-pay | Admitting: Internal Medicine

## 2019-02-11 ENCOUNTER — Telehealth: Payer: Self-pay

## 2019-02-11 DIAGNOSIS — E039 Hypothyroidism, unspecified: Secondary | ICD-10-CM

## 2019-02-11 DIAGNOSIS — D72819 Decreased white blood cell count, unspecified: Secondary | ICD-10-CM

## 2019-02-11 MED ORDER — LEVOTHYROXINE SODIUM 25 MCG PO TABS: 25.0000 ug | ORAL_TABLET | Freq: Every day | ORAL | 3 refills | Status: DC

## 2019-02-11 NOTE — Telephone Encounter (Signed)
Patient would like a call back asking for a detailed message about her lab result to be left at Ph#  [phone]

## 2019-02-11 NOTE — Telephone Encounter (Signed)
Please see result note 

## 2019-02-11 NOTE — Telephone Encounter (Signed)
Patient called and left detailed message. See result note.

## 2019-02-25 DIAGNOSIS — L661 Lichen planopilaris: Secondary | ICD-10-CM | POA: Diagnosis not present

## 2019-03-01 ENCOUNTER — Other Ambulatory Visit: Payer: PPO

## 2019-03-01 ENCOUNTER — Ambulatory Visit: Payer: PPO | Admitting: Oncology

## 2019-03-16 DIAGNOSIS — Z85818 Personal history of malignant neoplasm of other sites of lip, oral cavity, and pharynx: Secondary | ICD-10-CM | POA: Diagnosis not present

## 2019-03-16 DIAGNOSIS — R682 Dry mouth, unspecified: Secondary | ICD-10-CM | POA: Diagnosis not present

## 2019-03-29 ENCOUNTER — Other Ambulatory Visit: Payer: Self-pay

## 2019-03-31 ENCOUNTER — Other Ambulatory Visit (INDEPENDENT_AMBULATORY_CARE_PROVIDER_SITE_OTHER): Payer: PPO

## 2019-03-31 ENCOUNTER — Other Ambulatory Visit: Payer: Self-pay

## 2019-03-31 DIAGNOSIS — D72819 Decreased white blood cell count, unspecified: Secondary | ICD-10-CM

## 2019-03-31 DIAGNOSIS — E039 Hypothyroidism, unspecified: Secondary | ICD-10-CM | POA: Diagnosis not present

## 2019-03-31 LAB — TSH: TSH: 4.71 u[IU]/mL — ABNORMAL HIGH (ref 0.35–4.50)

## 2019-03-31 LAB — CBC WITH DIFFERENTIAL/PLATELET
Basophils Absolute: 0 10*3/uL (ref 0.0–0.1)
Basophils Relative: 0.1 % (ref 0.0–3.0)
Eosinophils Absolute: 0.1 10*3/uL (ref 0.0–0.7)
Eosinophils Relative: 1.8 % (ref 0.0–5.0)
HCT: 37 % (ref 36.0–46.0)
Hemoglobin: 12.6 g/dL (ref 12.0–15.0)
Lymphocytes Relative: 11.9 % — ABNORMAL LOW (ref 12.0–46.0)
Lymphs Abs: 0.7 10*3/uL (ref 0.7–4.0)
MCHC: 34.2 g/dL (ref 30.0–36.0)
MCV: 98.3 fl (ref 78.0–100.0)
Monocytes Absolute: 0.6 10*3/uL (ref 0.1–1.0)
Monocytes Relative: 9.7 % (ref 3.0–12.0)
Neutro Abs: 4.7 10*3/uL (ref 1.4–7.7)
Neutrophils Relative %: 76.5 % (ref 43.0–77.0)
Platelets: 195 10*3/uL (ref 150.0–400.0)
RBC: 3.76 Mil/uL — ABNORMAL LOW (ref 3.87–5.11)
RDW: 11.9 % (ref 11.5–15.5)
WBC: 6.1 10*3/uL (ref 4.0–10.5)

## 2019-04-01 ENCOUNTER — Other Ambulatory Visit: Payer: Self-pay | Admitting: Internal Medicine

## 2019-04-01 DIAGNOSIS — E039 Hypothyroidism, unspecified: Secondary | ICD-10-CM

## 2019-04-01 MED ORDER — LEVOTHYROXINE SODIUM 25 MCG PO TABS: 37.5000 ug | ORAL_TABLET | Freq: Every day | ORAL | 3 refills | Status: DC

## 2019-04-12 ENCOUNTER — Other Ambulatory Visit: Payer: Self-pay | Admitting: Internal Medicine

## 2019-04-12 NOTE — Telephone Encounter (Signed)
Pt called about needing a refill for levothyroxine (SYNTHROID) 25 MCG tablet for 90 day. Please advise and Thank you!  [id number] [place], [place]  Call pt @ [phone].

## 2019-04-12 NOTE — Telephone Encounter (Signed)
Advised patient medication sent on 04/01/19

## 2019-04-28 ENCOUNTER — Telehealth: Payer: Self-pay | Admitting: Internal Medicine

## 2019-04-28 NOTE — Telephone Encounter (Signed)
Left message for patient to call back and schedule Medicare Annual Wellness Visit (AWV) either virtually or audio only.  No hx of AWV; please schedule at anytime with Denisa O'Brien-Blaney at [place]   

## 2019-05-27 ENCOUNTER — Encounter: Payer: Self-pay | Admitting: Radiology

## 2019-05-27 ENCOUNTER — Ambulatory Visit
Admission: RE | Admit: 2019-05-27 | Discharge: 2019-05-27 | Disposition: A | Discharge status: Home / Self Care | Payer: PPO | Source: Ambulatory Visit | Attending: Internal Medicine | Admitting: Internal Medicine

## 2019-05-27 DIAGNOSIS — M810 Age-related osteoporosis without current pathological fracture: Secondary | ICD-10-CM | POA: Diagnosis not present

## 2019-05-27 DIAGNOSIS — Z1231 Encounter for screening mammogram for malignant neoplasm of breast: Secondary | ICD-10-CM | POA: Insufficient documentation

## 2019-05-27 DIAGNOSIS — M818 Other osteoporosis without current pathological fracture: Secondary | ICD-10-CM | POA: Diagnosis not present

## 2019-05-27 HISTORY — DX: Personal history of irradiation: Z92.3

## 2019-05-27 HISTORY — DX: Personal history of antineoplastic chemotherapy: Z92.21

## 2019-05-31 NOTE — Addendum Note (Signed)
Addended by: Orland Mustard on: 05/31/2019 12:59 AM   Modules accepted: Orders

## 2019-06-04 ENCOUNTER — Other Ambulatory Visit: Payer: Self-pay

## 2019-06-04 ENCOUNTER — Ambulatory Visit: Payer: PPO | Attending: Internal Medicine

## 2019-06-04 DIAGNOSIS — Z23 Encounter for immunization: Secondary | ICD-10-CM

## 2019-06-04 NOTE — Progress Notes (Signed)
   Covid-19 Vaccination Clinic  Name:  Meli Louro    MRN: [id number] DOB: [birth year]  06/04/2019  Ms. Trieu was observed post Covid-19 immunization for 15 minutes without incident. She was provided with Vaccine Information Sheet and instruction to access the V-Safe system.   Ms. Sikkenga was instructed to call 911 with any severe reactions post vaccine: Marland Kitchen Difficulty breathing  . Swelling of face and throat  . A fast heartbeat  . A bad rash all over body  . Dizziness and weakness   Immunizations Administered    Name Date Dose VIS Date Route   Pfizer COVID-19 Vaccine 06/04/2019  9:32 AM 0.3 mL 02/19/2019 Intramuscular   Manufacturer: [place]   Lot: [id number]   [place]: [id number]

## 2019-06-07 ENCOUNTER — Other Ambulatory Visit: Payer: Self-pay | Admitting: Internal Medicine

## 2019-06-07 ENCOUNTER — Telehealth: Payer: Self-pay | Admitting: Internal Medicine

## 2019-06-07 DIAGNOSIS — G8929 Other chronic pain: Secondary | ICD-10-CM

## 2019-06-07 DIAGNOSIS — M25552 Pain in left hip: Secondary | ICD-10-CM

## 2019-06-07 DIAGNOSIS — M25551 Pain in right hip: Secondary | ICD-10-CM

## 2019-06-07 DIAGNOSIS — M5442 Lumbago with sciatica, left side: Secondary | ICD-10-CM

## 2019-06-07 NOTE — Telephone Encounter (Signed)
Ive ordered low back xray and Xrays of both hips  With hip pain have patient do this prior to 06/17/19 appt with me   pls schedule   Thanks [place]

## 2019-06-09 ENCOUNTER — Ambulatory Visit (INDEPENDENT_AMBULATORY_CARE_PROVIDER_SITE_OTHER): Payer: PPO

## 2019-06-09 ENCOUNTER — Other Ambulatory Visit: Payer: PPO

## 2019-06-09 ENCOUNTER — Other Ambulatory Visit: Payer: Self-pay

## 2019-06-09 DIAGNOSIS — M1611 Unilateral primary osteoarthritis, right hip: Secondary | ICD-10-CM | POA: Diagnosis not present

## 2019-06-09 DIAGNOSIS — M5442 Lumbago with sciatica, left side: Secondary | ICD-10-CM | POA: Diagnosis not present

## 2019-06-09 DIAGNOSIS — M25552 Pain in left hip: Secondary | ICD-10-CM | POA: Diagnosis not present

## 2019-06-09 DIAGNOSIS — G8929 Other chronic pain: Secondary | ICD-10-CM | POA: Diagnosis not present

## 2019-06-09 DIAGNOSIS — M25551 Pain in right hip: Secondary | ICD-10-CM

## 2019-06-09 DIAGNOSIS — M1612 Unilateral primary osteoarthritis, left hip: Secondary | ICD-10-CM | POA: Diagnosis not present

## 2019-06-09 DIAGNOSIS — M545 Low back pain: Secondary | ICD-10-CM | POA: Diagnosis not present

## 2019-06-14 NOTE — Addendum Note (Signed)
Addended by: Orland Mustard on: 06/14/2019 11:39 AM   Modules accepted: Orders

## 2019-06-17 ENCOUNTER — Ambulatory Visit (INDEPENDENT_AMBULATORY_CARE_PROVIDER_SITE_OTHER): Payer: PPO | Admitting: Internal Medicine

## 2019-06-17 ENCOUNTER — Other Ambulatory Visit: Payer: Self-pay

## 2019-06-17 ENCOUNTER — Encounter: Payer: Self-pay | Admitting: Internal Medicine

## 2019-06-17 VITALS — BP 110/70 | HR 67 | Temp 97.1°F | Ht 65.5 in | Wt 138.8 lb

## 2019-06-17 DIAGNOSIS — R011 Cardiac murmur, unspecified: Secondary | ICD-10-CM

## 2019-06-17 DIAGNOSIS — M1611 Unilateral primary osteoarthritis, right hip: Secondary | ICD-10-CM

## 2019-06-17 DIAGNOSIS — M810 Age-related osteoporosis without current pathological fracture: Secondary | ICD-10-CM

## 2019-06-17 DIAGNOSIS — I10 Essential (primary) hypertension: Secondary | ICD-10-CM | POA: Diagnosis not present

## 2019-06-17 DIAGNOSIS — F419 Anxiety disorder, unspecified: Secondary | ICD-10-CM | POA: Diagnosis not present

## 2019-06-17 DIAGNOSIS — M47816 Spondylosis without myelopathy or radiculopathy, lumbar region: Secondary | ICD-10-CM | POA: Diagnosis not present

## 2019-06-17 MED ORDER — LISINOPRIL 40 MG PO TABS: 20.0000 mg | ORAL_TABLET | Freq: Every day | ORAL | 3 refills | Status: DC

## 2019-06-17 MED ORDER — ALPRAZOLAM 0.5 MG PO TABS: 0.5000 mg | ORAL_TABLET | Freq: Every day | ORAL | 5 refills | Status: DC | PRN

## 2019-06-17 MED ORDER — MELOXICAM 15 MG PO TABS: 15.0000 mg | ORAL_TABLET | Freq: Every day | ORAL | 1 refills | Status: DC | PRN

## 2019-06-17 NOTE — Patient Instructions (Signed)
Dr. Sharlet Salina   Epidural Steroid Injection Patient Information  Description: The epidural space surrounds the nerves as they exit the spinal cord.  In some patients, the nerves can be compressed and inflamed by a bulging disc or a tight spinal canal (spinal stenosis).  By injecting steroids into the epidural space, we can bring irritated nerves into direct contact with a potentially helpful medication.  These steroids act directly on the irritated nerves and can reduce swelling and inflammation which often leads to decreased pain.  Epidural steroids may be injected anywhere along the spine and from the neck to the low back depending upon the location of your pain.   After numbing the skin with local anesthetic (like Novocaine), a small needle is passed into the epidural space slowly.  You may experience a sensation of pressure while this is being done.  The entire block usually last less than 10 minutes.  Conditions which may be treated by epidural steroids:   Low back and leg pain  Neck and arm pain  Spinal stenosis  Post-laminectomy syndrome  Herpes zoster (shingles) pain  Pain from compression fractures  Preparation for the injection:  1. Do not eat any solid food or dairy products within 8 hours of your appointment.  2. You may drink clear liquids up to 3 hours before appointment.  Clear liquids include water, black coffee, juice or soda.  No milk or cream please. 3. You may take your regular medication, including pain medications, with a sip of water before your appointment  Diabetics should hold regular insulin (if taken separately) and take 1/2 normal NPH dos the morning of the procedure.  Carry some sugar containing items with you to your appointment. 4. A driver must accompany you and be prepared to drive you home after your procedure.  5. Bring all your current medications with your. 6. An IV may be inserted and sedation may be given at the discretion of the physician.   7. A  blood pressure cuff, EKG and other monitors will often be applied during the procedure.  Some patients may need to have extra oxygen administered for a short period. 8. You will be asked to provide medical information, including your allergies, prior to the procedure.  We must know immediately if you are taking blood thinners (like Coumadin/Warfarin)  Or if you are allergic to IV iodine contrast (dye). We must know if you could possible be pregnant.  Possible side-effects:  Bleeding from needle site  Infection (rare, may require surgery)  Nerve injury (rare)  Numbness & tingling (temporary)  Difficulty urinating (rare, temporary)  Spinal headache ( a headache worse with upright posture)  Light -headedness (temporary)  Pain at injection site (several days)  Decreased blood pressure (temporary)  Weakness in arm/leg (temporary)  Pressure sensation in back/neck (temporary)  Call if you experience:  Fever/chills associated with headache or increased back/neck pain.  Headache worsened by an upright position.  New onset weakness or numbness of an extremity below the injection site  Hives or difficulty breathing (go to the emergency room)  Inflammation or drainage at the infection site  Severe back/neck pain  Any new symptoms which are concerning to you  Please note:  Although the local anesthetic injected can often make your back or neck feel good for several hours after the injection, the pain will likely return.  It takes 3-7 days for steroids to work in the epidural space.  You may not notice any pain relief for at least  that one week.  If effective, we will often do a series of three injections spaced 3-6 weeks apart to maximally decrease your pain.  After the initial series, we generally will wait several months before considering a repeat injection of the same type.  If you have any questions, please call [phone] [facility]

## 2019-06-17 NOTE — Progress Notes (Signed)
Chief Complaint  Patient presents with  . Referral    Would like to see rheumatology for arthritis. oain 4/10 today. Has reduced movement due to arthritis, degeneration, and nerve problems. Referred to ortho but they have not contacted patient and according to pt are too busy.   . Form Completion    Handicap placard form  . Medication Management    Discuss Lisinopril dosage   F/u  1.4/10  Right hip pain with abnormal Xray right hip severe arthritis and subchondral collapse femoral head ortho referral pending will check previously took mobic and ibuprofen has helped in the past  At times crossing right leg or putting on socks and shoes has issues  She would like handicap sticker filled out today Due to health issues it is causing anxiety for her  06/09/19   IMPRESSION: Advanced degenerative changes of the right hip with an irregular appearance of the femoral head suggestive of subchondral collapse. This is new since 2018.  2. Osteoporosis with fosamax use had jaw necrosis took in 06/2018 and prior to that for about 3 years endocrine appt 07/15/19 to review options  3. Abnormal Xray lower back with arthritis agreeable to PM&R referral to consider back injection 06/09/19 FINDINGS: Frontal, lateral, and bilateral oblique views of the lumbar spine are obtained. There are 5 non-rib-bearing lumbar type vertebral bodies with right convex scoliosis centered at L3/L4. No acute displaced fractures. There is significant multilevel spondylosis greatest at L1/L2 and L2/L3. Facet hypertrophic changes are greatest at L5/S1. Sacroiliac joints are normal.  IMPRESSION: 1. No acute fracture. 2. Right convex scoliosis. 3. Prominent spondylosis at the thoracolumbar junction, with significant facet hypertrophy at the lumbosacral junction.   Review of Systems  Constitutional: Negative for weight loss.  HENT: Negative for hearing loss.   Eyes: Negative for blurred vision.  Respiratory: Negative for  shortness of breath.   Cardiovascular: Negative for chest pain.  Musculoskeletal: Positive for back pain and joint pain. Negative for falls.  Skin: Negative for rash.  Psychiatric/Behavioral: The patient is nervous/anxious.    Past Medical History:  Diagnosis Date  . Anxiety   . Arthritis   . Asthma   . 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 Tami Ribas   . Hyperlipidemia   . Hypertension   . Personal history of chemotherapy   . Personal history of radiation therapy   . Postmenopausal    Past Surgical History:  Procedure Laterality Date  . left tonsillar surgery     02/26/16 invasive moderate SCC   Family History  Problem Relation Age of Onset  . Other Mother        legally blind  . Hearing loss Mother   . Heart disease Mother        MI  . Hyperlipidemia Mother   . Hypertension Mother   . Kidney disease Mother   . Cancer Father        lung smoker  . Breast cancer Neg Hx    Social History   Socioeconomic History  . Marital status: Married    Spouse name: Not on file  . Number of children: Not on file  . Years of education: Not on file  . Highest education level: Not on file  Occupational History  . Not on file  Tobacco Use  . Smoking status: Never Smoker  . Smokeless tobacco: Never Used  Substance and Sexual Activity  . Alcohol use: Not Currently  . Drug  use: Not Currently  . Sexual activity: Not Currently  Other Topics Concern  . 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 Difficulty of Paying Living Expenses:   Food Insecurity:   . Worried About Charity fundraiser in the Last Year:   . Arboriculturist in the Last Year:   Transportation Needs:   . Film/video editor (Medical):   Marland Kitchen Lack of Transportation  (Non-Medical):   Physical Activity:   . Days of Exercise per Week:   . Minutes of Exercise per Session:   Stress:   . Feeling of Stress :   Social Connections:   . Frequency of Communication with Friends and Family:   . Frequency of Social Gatherings with Friends and Family:   . Attends Religious Services:   . Active Member of Clubs or Organizations:   . Attends Archivist Meetings:   Marland Kitchen Marital Status:   Intimate Partner Violence:   . Fear of Current or Ex-Partner:   . Emotionally Abused:   Marland Kitchen Physically Abused:   . Sexually Abused:    Current Meds  Medication Sig  . ALPRAZolam (XANAX) 0.5 MG tablet Take 1 tablet (0.5 mg total) by mouth daily as needed for anxiety.  Marland Kitchen amLODipine (NORVASC) 5 MG tablet Take 1 tablet (5 mg total) by mouth daily.  . Calcium Carbonate-Vit D-Min (CALCIUM 1200 PO) Take by mouth.  . Cholecalciferol (VITAMIN D) 2000 units tablet Take 2,000 Units by mouth daily.  . clobetasol (TEMOVATE) 0.05 % external solution Apply 1 application topically 2 (two) times daily.  . finasteride (PROSCAR) 5 MG tablet Take 5 mg by mouth daily.  Marland Kitchen ipratropium (ATROVENT) 0.06 % nasal spray Place 2 sprays into both nostrils 4 (four) times daily.  Marland Kitchen levothyroxine (SYNTHROID) 25 MCG tablet Take 1.5 tablets (37.5 mcg total) by mouth daily before breakfast. 30 minutes before food. Do not take with vitamins wait 4 hours after medication before vitamins  . lisinopril (ZESTRIL) 40 MG tablet Take 0.5-1 tablets (20-40 mg total) by mouth daily. Take 40 mg if BP not at goal <130/<80  . NALTREXONE HCL PO Take 3 mg by mouth daily.  . niacin (NIASPAN) 500 MG CR tablet Take 1 tablet (500 mg total) by mouth at bedtime.  . pravastatin (PRAVACHOL) 40 MG tablet Take 1 tablet (40 mg total) by mouth daily.  . [DISCONTINUED] ALPRAZolam (XANAX) 0.5 MG tablet Take 1 tablet (0.5 mg total) by mouth daily as needed for anxiety.  . [DISCONTINUED] lisinopril (ZESTRIL) 40 MG tablet Take 0.5 tablets (20  mg total) by mouth daily.   Allergies  Allergen Reactions  . Pioglitazone Swelling    Fluid retention   Recent Results (from the past 2160 hour(s))  CBC with Differential/Platelet     Status: Abnormal   Collection Time: 03/31/19 11:26 AM  Result Value Ref Range   WBC 6.1 4.0 - 10.5 K/uL   RBC 3.76 (L) 3.87 - 5.11 Mil/uL   Hemoglobin 12.6 12.0 - 15.0 g/dL   HCT 37.0 36.0 - 46.0 %   MCV 98.3 78.0 - 100.0 fl   MCHC 34.2 30.0 - 36.0 g/dL   RDW 11.9 11.5 - 15.5 %   Platelets 195.0  150.0 - 400.0 K/uL   Neutrophils Relative % 76.5 43.0 - 77.0 %   Lymphocytes Relative 11.9 (L) 12.0 - 46.0 %   Monocytes Relative 9.7 3.0 - 12.0 %   Eosinophils Relative 1.8 0.0 - 5.0 %   Basophils Relative 0.1 0.0 - 3.0 %   Neutro Abs 4.7 1.4 - 7.7 K/uL   Lymphs Abs 0.7 0.7 - 4.0 K/uL   Monocytes Absolute 0.6 0.1 - 1.0 K/uL   Eosinophils Absolute 0.1 0.0 - 0.7 K/uL   Basophils Absolute 0.0 0.0 - 0.1 K/uL  TSH     Status: Abnormal   Collection Time: 03/31/19 11:26 AM  Result Value Ref Range   TSH 4.71 (H) 0.35 - 4.50 uIU/mL   Objective  Body mass index is 22.75 kg/m. Wt Readings from Last 3 Encounters:  06/17/19 138 lb 12.8 oz (63 kg)  12/17/18 138 lb 6.4 oz (62.8 kg)  02/26/18 138 lb 6.4 oz (62.8 kg)   Temp Readings from Last 3 Encounters:  06/17/19 (!) 97.1 F (36.2 C) (Temporal)  12/17/18 98.3 F (36.8 C) (Oral)  02/26/18 (!) 97.2 F (36.2 C) (Tympanic)   BP Readings from Last 3 Encounters:  06/17/19 110/70  12/17/18 130/80  02/26/18 112/65   Pulse Readings from Last 3 Encounters:  06/17/19 67  12/17/18 61  02/26/18 (!) 55    Physical Exam Vitals and nursing note reviewed.  Constitutional:      Appearance: Normal appearance. She is well-developed and well-groomed.  HENT:     Head: Normocephalic and atraumatic.  Eyes:     Conjunctiva/sclera: Conjunctivae normal.     Pupils: Pupils are equal, round, and reactive to light.  Cardiovascular:     Rate and Rhythm: Normal rate  and regular rhythm.     Heart sounds: Murmur present.  Pulmonary:     Effort: Pulmonary effort is normal.     Breath sounds: Normal breath sounds.  Skin:    General: Skin is warm and moist.  Neurological:     General: No focal deficit present.     Mental Status: She is alert and oriented to person, place, and time. Mental status is at baseline.  Psychiatric:        Attention and Perception: Attention and perception normal.        Mood and Affect: Mood and affect normal.        Speech: Speech normal.        Behavior: Behavior normal. Behavior is cooperative.        Thought Content: Thought content normal.        Cognition and Memory: Cognition and memory normal.        Judgment: Judgment normal.     Assessment  Plan  Arthritis of lumbar spine - Plan: Ambulatory referral to Physical Medicine Rehab Dr. Sharlet Salina consider epidural injection, meloxicam (MOBIC) 15 MG tablet  Essential hypertension - Plan: lisinopril (ZESTRIL) 40 MG tablet And norvasc 5 mg qd controlled today   Anxiety - Plan: ALPRAZolam (XANAX) 0.5 MG tablet  Heart murmur Consider echo in future  Osteoporosis, unspecified osteoporosis type, unspecified pathological fracture presence Endocrine appt 07/15/19   Arthritis of right hip  Pending Dr. Marry Guan appt   HM Flu shot utd  Had pna 24  Given Rx prevnar  Wants to wait on shingrix  Tdap had 10/01/17  covid vx had 1/2   Never had colonoscopy declines disc cologuard today wants to wait  Out of age window pap last 07/24/17 neg  pap neg HPV no h/o abnormal pap  Mammogram 05/27/19  dexa 01/02/17 +osteoporosis on fosamax 06/2018 and for 3 years pending endocrine appt 07/15/19  Dermatology Dr. Clemon Chambers Q6 months no h/o nmsc  AE appt 01/2019 h/o cataracts   Dentist Dr. Jacelyn Grip  ENT Tami Ribas  H/o finnegan    Provider: Dr. Olivia Mackie McLean-Scocuzza-Internal Medicine

## 2019-06-18 DIAGNOSIS — M1611 Unilateral primary osteoarthritis, right hip: Secondary | ICD-10-CM | POA: Insufficient documentation

## 2019-06-24 ENCOUNTER — Other Ambulatory Visit: Payer: PPO

## 2019-06-24 ENCOUNTER — Other Ambulatory Visit: Payer: Self-pay

## 2019-06-24 ENCOUNTER — Other Ambulatory Visit
Admission: RE | Admit: 2019-06-24 | Discharge: 2019-06-24 | Disposition: A | Discharge status: Home / Self Care | Payer: PPO | Source: Ambulatory Visit | Attending: Internal Medicine | Admitting: Internal Medicine

## 2019-06-24 DIAGNOSIS — M791 Myalgia, unspecified site: Secondary | ICD-10-CM

## 2019-06-24 DIAGNOSIS — M2550 Pain in unspecified joint: Secondary | ICD-10-CM | POA: Diagnosis not present

## 2019-06-24 LAB — SEDIMENTATION RATE: Sed Rate: 6 mm/hr (ref 0–30)

## 2019-06-24 LAB — C-REACTIVE PROTEIN: CRP: <0.5 mg/dL (ref <1.0)

## 2019-06-25 LAB — RHEUMATOID FACTOR: Rheumatoid fact SerPl-aCnc: <10 IU/mL (ref 0.0–13.9)

## 2019-06-25 LAB — ANA: Anti Nuclear Antibody (ANA): NEGATIVE

## 2019-06-26 LAB — CYCLIC CITRUL PEPTIDE ANTIBODY, IGG/IGA: CCP Antibodies IgG/IgA: 5 units (ref 0–19)

## 2019-06-29 ENCOUNTER — Ambulatory Visit: Payer: PPO | Attending: Internal Medicine

## 2019-06-29 DIAGNOSIS — Z23 Encounter for immunization: Secondary | ICD-10-CM

## 2019-06-29 NOTE — Progress Notes (Signed)
   Covid-19 Vaccination Clinic  Name:  Helen Snow    MRN: [id number] DOB: [birth year]  06/29/2019  [name] was observed post Covid-19 immunization for 15 minutes without incident. She was provided with Vaccine Information Sheet and instruction to access the V-Safe system.   [name] was instructed to call 911 with any severe reactions post vaccine: Marland Kitchen Difficulty breathing  . Swelling of face and throat  . A fast heartbeat  . A bad rash all over body  . Dizziness and weakness   Immunizations Administered    Name Date Dose VIS Date Route   Pfizer COVID-19 Vaccine 06/29/2019 11:21 AM 0.3 mL 05/05/2018 Intramuscular   Manufacturer: [employer], [employer]   Lot: [id number]   [place]: [id number]

## 2019-07-06 DIAGNOSIS — M47816 Spondylosis without myelopathy or radiculopathy, lumbar region: Secondary | ICD-10-CM | POA: Diagnosis not present

## 2019-07-06 DIAGNOSIS — M5136 Other intervertebral disc degeneration, lumbar region: Secondary | ICD-10-CM | POA: Diagnosis not present

## 2019-07-15 DIAGNOSIS — M810 Age-related osteoporosis without current pathological fracture: Secondary | ICD-10-CM | POA: Diagnosis not present

## 2019-08-03 DIAGNOSIS — M25551 Pain in right hip: Secondary | ICD-10-CM | POA: Diagnosis not present

## 2019-08-03 DIAGNOSIS — M1611 Unilateral primary osteoarthritis, right hip: Secondary | ICD-10-CM | POA: Diagnosis not present

## 2019-08-03 DIAGNOSIS — G8929 Other chronic pain: Secondary | ICD-10-CM | POA: Diagnosis not present

## 2019-08-03 DIAGNOSIS — M25552 Pain in left hip: Secondary | ICD-10-CM | POA: Diagnosis not present

## 2019-08-26 DIAGNOSIS — L661 Lichen planopilaris: Secondary | ICD-10-CM | POA: Diagnosis not present

## 2019-09-21 DIAGNOSIS — R682 Dry mouth, unspecified: Secondary | ICD-10-CM | POA: Diagnosis not present

## 2019-11-11 ENCOUNTER — Ambulatory Visit (INDEPENDENT_AMBULATORY_CARE_PROVIDER_SITE_OTHER): Payer: PPO

## 2019-11-11 VITALS — Ht 65.5 in | Wt 138.0 lb

## 2019-11-11 DIAGNOSIS — Z0000 Encounter for general adult medical examination without abnormal findings: Secondary | ICD-10-CM | POA: Diagnosis not present

## 2019-11-11 NOTE — Patient Instructions (Addendum)
Helen Snow , Thank you for taking time to come for your Medicare Wellness Visit. I appreciate your ongoing commitment to your health goals. Please review the following plan we discussed and let me know if I can assist you in the future.   These are the goals we discussed: Goals    . Follow up with Primary Care Provider     As needed       This is a list of the screening recommended for you and due dates:  Health Maintenance  Topic Date Due  . Colon Cancer Screening  12/18/2019*  . Flu Shot  06/08/2020*  . Mammogram  05/26/2021  . Tetanus Vaccine  10/02/2027  . DEXA scan (bone density measurement)  Completed  . COVID-19 Vaccine  Completed  .  Hepatitis C: One time screening is recommended by Center for Disease Control  (CDC) for  adults born from [birth year] through 1964.   Completed  . Pneumonia vaccines  Completed  *Topic was postponed. The date shown is not the original due date.    Immunizations Immunization History  Administered Date(s) Administered  . Influenza, High Dose Seasonal PF 12/25/2016, 01/01/2018, 12/29/2018  . Influenza-Unspecified 12/23/2013, 12/22/2014, 12/29/2015, 12/25/2016, 12/29/2018  . PFIZER SARS-COV-2 Vaccination 06/04/2019, 06/29/2019  . Pneumococcal Conjugate-13 12/22/2018, 01/20/2019  . Pneumococcal Polysaccharide-23 06/26/2017  . Tdap 10/01/2017   Advanced directives: yes, on file   Conditions/risks identified: none   Follow up in one year for your annual wellness visit.   Preventive Care 68 Years and Older, Female Preventive care refers to lifestyle choices and visits with your health care provider that can promote health and wellness. What does preventive care include?  A yearly physical exam. This is also called an annual well check.  Dental exams once or twice a year.  Routine eye exams. Ask your health care provider how often you should have your eyes checked.  Personal lifestyle choices, including:  Daily care of your teeth and  gums.  Regular physical activity.  Eating a healthy diet.  Avoiding tobacco and drug use.  Limiting alcohol use.  Practicing safe sex.  Taking low-dose aspirin every day.  Taking vitamin and mineral supplements as recommended by your health care provider. What happens during an annual well check? The services and screenings done by your health care provider during your annual well check will depend on your age, overall health, lifestyle risk factors, and family history of disease. Counseling  Your health care provider may ask you questions about your:  Alcohol use.  Tobacco use.  Drug use.  Emotional well-being.  Home and relationship well-being.  Sexual activity.  Eating habits.  History of falls.  Memory and ability to understand (cognition).  Work and work Statistician.  Reproductive health. Screening  You may have the following tests or measurements:  Height, weight, and BMI.  Blood pressure.  Lipid and cholesterol levels. These may be checked every 5 years, or more frequently if you are over 13 years old.  Skin check.  Lung cancer screening. You may have this screening every year starting at age 26 if you have a 30-pack-year history of smoking and currently smoke or have quit within the past 15 years.  Fecal occult blood test (FOBT) of the stool. You may have this test every year starting at age 73.  Flexible sigmoidoscopy or colonoscopy. You may have a sigmoidoscopy every 5 years or a colonoscopy every 10 years starting at age 49.  Hepatitis C blood test.  Hepatitis B  blood test.  Sexually transmitted disease (STD) testing.  Diabetes screening. This is done by checking your blood sugar (glucose) after you have not eaten for a while (fasting). You may have this done every 1-3 years.  Bone density scan. This is done to screen for osteoporosis. You may have this done starting at age 32.  Mammogram. This may be done every 1-2 years. Talk to your  health care provider about how often you should have regular mammograms. Talk with your health care provider about your test results, treatment options, and if necessary, the need for more tests. Vaccines  Your health care provider may recommend certain vaccines, such as:  Influenza vaccine. This is recommended every year.  Tetanus, diphtheria, and acellular pertussis (Tdap, Td) vaccine. You may need a Td booster every 10 years.  Zoster vaccine. You may need this after age 71.  Pneumococcal 13-valent conjugate (PCV13) vaccine. One dose is recommended after age 62.  Pneumococcal polysaccharide (PPSV23) vaccine. One dose is recommended after age 45. Talk to your health care provider about which screenings and vaccines you need and how often you need them. This information is not intended to replace advice given to you by your health care provider. Make sure you discuss any questions you have with your health care provider. Document Released: 03/24/2015 Document Revised: 11/15/2015 Document Reviewed: 12/27/2014 Elsevier Interactive Patient Education  2017 [place] Prevention in the Home Falls can cause injuries. They can happen to people of all ages. There are many things you can do to make your home safe and to help prevent falls. What can I do on the outside of my home?  Regularly fix the edges of walkways and driveways and fix any cracks.  Remove anything that might make you trip as you walk through a door, such as a raised step or threshold.  Trim any bushes or trees on the path to your home.  Use bright outdoor lighting.  Clear any walking paths of anything that might make someone trip, such as rocks or tools.  Regularly check to see if handrails are loose or broken. Make sure that both sides of any steps have handrails.  Any raised decks and porches should have guardrails on the edges.  Have any leaves, snow, or ice cleared regularly.  Use sand or salt on walking  paths during winter.  Clean up any spills in your garage right away. This includes oil or grease spills. What can I do in the bathroom?  Use night lights.  Install grab bars by the toilet and in the tub and shower. Do not use towel bars as grab bars.  Use non-skid mats or decals in the tub or shower.  If you need to sit down in the shower, use a plastic, non-slip stool.  Keep the floor dry. Clean up any water that spills on the floor as soon as it happens.  Remove soap buildup in the tub or shower regularly.  Attach bath mats securely with double-sided non-slip rug tape.  Do not have throw rugs and other things on the floor that can make you trip. What can I do in the bedroom?  Use night lights.  Make sure that you have a light by your bed that is easy to reach.  Do not use any sheets or blankets that are too big for your bed. They should not hang down onto the floor.  Have a firm chair that has side arms. You can use this for support  while you get dressed.  Do not have throw rugs and other things on the floor that can make you trip. What can I do in the kitchen?  Clean up any spills right away.  Avoid walking on wet floors.  Keep items that you use a lot in easy-to-reach places.  If you need to reach something above you, use a strong step stool that has a grab bar.  Keep electrical cords out of the way.  Do not use floor polish or wax that makes floors slippery. If you must use wax, use non-skid floor wax.  Do not have throw rugs and other things on the floor that can make you trip. What can I do with my stairs?  Do not leave any items on the stairs.  Make sure that there are handrails on both sides of the stairs and use them. Fix handrails that are broken or loose. Make sure that handrails are as long as the stairways.  Check any carpeting to make sure that it is firmly attached to the stairs. Fix any carpet that is loose or worn.  Avoid having throw rugs at  the top or bottom of the stairs. If you do have throw rugs, attach them to the floor with carpet tape.  Make sure that you have a light switch at the top of the stairs and the bottom of the stairs. If you do not have them, ask someone to add them for you. What else can I do to help prevent falls?  Wear shoes that:  Do not have high heels.  Have rubber bottoms.  Are comfortable and fit you well.  Are closed at the toe. Do not wear sandals.  If you use a stepladder:  Make sure that it is fully opened. Do not climb a closed stepladder.  Make sure that both sides of the stepladder are locked into place.  Ask someone to hold it for you, if possible.  Clearly mark and make sure that you can see:  Any grab bars or handrails.  First and last steps.  Where the edge of each step is.  Use tools that help you move around (mobility aids) if they are needed. These include:  Canes.  Walkers.  Scooters.  Crutches.  Turn on the lights when you go into a dark area. Replace any light bulbs as soon as they burn out.  Set up your furniture so you have a clear path. Avoid moving your furniture around.  If any of your floors are uneven, fix them.  If there are any pets around you, be aware of where they are.  Review your medicines with your doctor. Some medicines can make you feel dizzy. This can increase your chance of falling. Ask your doctor what other things that you can do to help prevent falls. This information is not intended to replace advice given to you by your health care provider. Make sure you discuss any questions you have with your health care provider. Document Released: 12/22/2008 Document Revised: 08/03/2015 Document Reviewed: 04/01/2014 Elsevier Interactive Patient Education  2017 [employer].

## 2019-11-11 NOTE — Progress Notes (Signed)
Subjective:   Helen Snow is a 69 y.o. female who presents for an Initial Medicare Annual Wellness Visit.  Review of Systems    No ROS.  Medicare Wellness Virtual Visit.     Cardiac Risk Factors include: advanced age (>13men, >64 women)     Objective:    Today's Vitals   11/11/19 1005  Weight: 138 lb (62.6 kg)  Height: 5' 5.5" (1.664 m)   Body mass index is 22.62 kg/m.  Advanced Directives 11/11/2019 02/26/2018 08/20/2017 05/29/2017 02/05/2017 10/31/2016 10/23/2016  Does Patient Have a Medical Advance Directive? Yes Yes Yes Yes No Yes Yes  Type of Paramedic of [place];Living will Living will;Healthcare Power of [place];Living will [place];Living will [place];Living will Living will;Healthcare Power of Attorney Living will;Healthcare Power of Attorney  Does patient want to make changes to medical advance directive? No - Patient declined No - Patient declined - No - Patient declined - No - Patient declined -  Copy of [place] in Chart? Yes - validated most recent copy scanned in chart (See row information) Yes - validated most recent copy scanned in chart (See row information) - No - copy requested No - copy requested No - copy requested -    Current Medications (verified) Outpatient Encounter Medications as of 11/11/2019  Medication Sig  . ALPRAZolam (XANAX) 0.5 MG tablet Take 1 tablet (0.5 mg total) by mouth daily as needed for anxiety.  Marland Kitchen amLODipine (NORVASC) 5 MG tablet Take 1 tablet (5 mg total) by mouth daily.  . Calcium Carbonate-Vit D-Min (CALCIUM 1200 PO) Take by mouth.  . Cholecalciferol (VITAMIN D) 2000 units tablet Take 2,000 Units by mouth daily.  . clobetasol (TEMOVATE) 0.05 % external solution Apply 1 application topically 2 (two) times daily.  . clobetasol cream (TEMOVATE) 3.15 % Apply 1 application topically 4 (four) times a week.  . finasteride  (PROSCAR) 5 MG tablet Take 5 mg by mouth daily.  Marland Kitchen ipratropium (ATROVENT) 0.06 % nasal spray Place 2 sprays into both nostrils 4 (four) times daily.  Marland Kitchen levothyroxine (SYNTHROID) 25 MCG tablet Take 1.5 tablets (37.5 mcg total) by mouth daily before breakfast. 30 minutes before food. Do not take with vitamins wait 4 hours after medication before vitamins  . lisinopril (ZESTRIL) 40 MG tablet Take 0.5-1 tablets (20-40 mg total) by mouth daily. Take 40 mg if BP not at goal <130/<80  . meloxicam (MOBIC) 15 MG tablet Take 1 tablet (15 mg total) by mouth daily as needed for pain.  Marland Kitchen NALTREXONE HCL PO Take 3 mg by mouth daily.  . niacin (NIASPAN) 500 MG CR tablet Take 1 tablet (500 mg total) by mouth at bedtime.  . pravastatin (PRAVACHOL) 40 MG tablet Take 1 tablet (40 mg total) by mouth daily.   No facility-administered encounter medications on file as of 11/11/2019.    Allergies (verified) Pioglitazone   History: Past Medical History:  Diagnosis Date  . Anxiety   . Arthritis   . Asthma   . 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 Tami Ribas   . Hyperlipidemia   . Hypertension   . Personal history of chemotherapy   . Personal history of radiation therapy   . Postmenopausal    Past Surgical History:  Procedure Laterality Date  . left tonsillar surgery     02/26/16 invasive moderate SCC   Family History  Problem Relation Age of Onset  . Other Mother        legally blind  . Hearing loss Mother   . Heart disease Mother        MI  . Hyperlipidemia Mother   . Hypertension Mother   . Kidney disease Mother   . Cancer Father        lung smoker  . Breast cancer Neg Hx    Social History   Socioeconomic History  . Marital status: Married    Spouse name: Not on file  . Number of children: Not on file  . Years of education: Not on file  . Highest education level: Not on file  Occupational History  . Not on file  Tobacco Use  .  Smoking status: Never Smoker  . Smokeless tobacco: Never Used  Substance and Sexual Activity  . Alcohol use: Not Currently  . Drug use: Not Currently  . Sexual activity: Not Currently  Other Topics Concern  . 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 Difficulty of Paying Living Expenses: Not hard at all  Food Insecurity: No Food Insecurity  . Worried About Charity fundraiser in the Last Year: Never true  . Ran Out of Food in the Last Year: Never true  Transportation Needs: No Transportation Needs  . Lack of Transportation (Medical): No  . Lack of Transportation (Non-Medical): No  Physical Activity:   . Days of Exercise per Week: Not on file  . Minutes of Exercise per Session: Not on file  Stress: No Stress Concern Present  . Feeling of Stress : Not at all  Social Connections: Unknown  . Frequency of Communication with Friends and Family: More than three times a week  . Frequency of Social Gatherings with Friends and Family: More than three times a week  . Attends Religious Services: Not on file  . Active Member of Clubs or Organizations: Not on file  . Attends Archivist Meetings: Not on file  . Marital Status: Widowed    Tobacco Counseling Counseling given: Not Answered   Clinical Intake:  Pre-visit preparation completed: Yes        Diabetes: No  How often do you need to have someone help you when you read instructions, pamphlets, or other written materials from your doctor or pharmacy?: 1 - Never   Interpreter Needed?: No      Activities of Daily Living In your present state of health, do you have any difficulty performing the following activities: 11/11/2019  Hearing? N  Vision? N  Difficulty concentrating or making decisions? N  Walking or climbing  stairs? Y  Comment Chronic hip pain. Cane in use when walking.  Dressing or bathing? N  Doing errands, shopping? N  Preparing Food and eating ? N  Using the Toilet? N  In the past six months, have you accidently leaked urine? N  Do you have problems with loss of bowel control? N  Managing your Medications? N  Managing your Finances? N  Housekeeping or managing your Housekeeping? N  Some recent data might be hidden    Patient Care Team: McLean-Scocuzza, Nino Glow, MD as PCP - General (Internal Medicine) Delight Hoh  J, MD as Consulting Physician (Oncology) Noreene Filbert, MD as Referring Physician (Radiation Oncology)  Indicate any recent Medical Services you may have received from other than [facility] providers in the past year (date may be approximate).     Assessment:   This is a routine wellness examination for Helen Snow.  I connected with Lajean today by telephone and verified that I am speaking with the correct person using two identifiers. Location patient: home Location provider: work Persons participating in the virtual visit: patient, Marine scientist.    I discussed the limitations, risks, security and privacy concerns of performing an evaluation and management service by telephone and the availability of in person appointments. The patient expressed understanding and verbally consented to this telephonic visit.    Interactive audio and video telecommunications were attempted between this provider and patient, however failed, due to patient having technical difficulties OR patient did not have access to video capability.  We continued and completed visit with audio only.  Some vital signs may be absent or patient reported.   Hearing/Vision screen  Hearing Screening   125Hz  250Hz  500Hz  1000Hz  2000Hz  3000Hz  4000Hz  6000Hz  8000Hz   Right ear:           Left ear:           Comments: Patient is able to hear conversational tones without difficulty.  No issues reported.   Vision  Screening Comments: Wears corrective lenses Visual acuity not assessed, virtual visit.  They have seen their ophthalmologist in the last 12 months.    Dietary issues and exercise activities discussed: Current Exercise Habits: The patient does not participate in regular exercise at present  Goals    . Follow up with Primary Care Provider     As needed      Depression Screen PHQ 2/9 Scores 11/11/2019 06/17/2019 12/17/2018 05/29/2017  PHQ - 2 Score 0 1 0 0    Fall Risk Fall Risk  11/11/2019 06/17/2019 12/17/2018 05/29/2017  Falls in the past year? 1 0 0 No  Number falls in past yr: 0 0 - -  Comment She lost her balance and caught herself. Cane in use. - - -  Injury with Fall? 0 0 - -  Follow up Falls evaluation completed Falls evaluation completed - -   Handrails in use when climbing stairs? Yes  Home free of loose throw rugs in walkways, pet beds, electrical cords, etc? Yes  Adequate lighting in your home to reduce risk of falls? Yes   ASSISTIVE DEVICES UTILIZED TO PREVENT FALLS:  Life alert? No  Use of a cane, walker or w/c? Yes   Grab bars in the bathroom? Yes  Shower chair or bench in shower? Yes  Elevated toilet seat or a handicapped toilet? Yes   TIMED UP AND GO:  Was the test performed? No . Virtual visit.   Cognitive Function:  Patient is alert and oriented x3.   Denies difficulty with making decisions, memory loss.     Immunizations Immunization History  Administered Date(s) Administered  . Influenza, High Dose Seasonal PF 12/25/2016, 01/01/2018, 12/29/2018  . Influenza-Unspecified 12/23/2013, 12/22/2014, 12/29/2015, 12/25/2016, 12/29/2018  . PFIZER SARS-COV-2 Vaccination 06/04/2019, 06/29/2019  . Pneumococcal Conjugate-13 12/22/2018, 01/20/2019  . Pneumococcal Polysaccharide-23 06/26/2017  . Tdap 10/01/2017   Health Maintenance Health Maintenance  Topic Date Due  . COLONOSCOPY  12/18/2019 (Originally 07/12/2001)  . INFLUENZA VACCINE  06/08/2020 (Originally  10/10/2019)  . MAMMOGRAM  05/26/2021  . TETANUS/TDAP  10/02/2027  . DEXA SCAN  Completed  .  COVID-19 Vaccine  Completed  . Hepatitis C Screening  Completed  . PNA vac Low Risk Adult  Completed   Dental Screening: Recommended annual dental exams for proper oral hygiene.Visits every 6 months.   Community Resource Referral / Chronic Care Management: CRR required this visit?  No   CCM required this visit?  No      Plan:   I have personally reviewed and noted the following in the patient's chart:   . Medical and social history . Use of alcohol, tobacco or illicit drugs  . Current medications and supplements . Functional ability and status . Nutritional status . Physical activity . Advanced directives . List of other physicians . Hospitalizations, surgeries, and ER visits in previous 12 months . Vitals . Screenings to include cognitive, depression, and falls . Referrals and appointments  In addition, I have reviewed and discussed with patient certain preventive protocols, quality metrics, and best practice recommendations. A written personalized care plan for preventive services as well as general preventive health recommendations were provided to patient via mychart.     Varney Biles, LPN   10/17/2117

## 2019-12-02 ENCOUNTER — Telehealth: Payer: Self-pay | Admitting: Internal Medicine

## 2019-12-02 ENCOUNTER — Other Ambulatory Visit: Payer: Self-pay

## 2019-12-02 ENCOUNTER — Other Ambulatory Visit: Payer: Self-pay | Admitting: Internal Medicine

## 2019-12-02 DIAGNOSIS — I10 Essential (primary) hypertension: Secondary | ICD-10-CM

## 2019-12-02 DIAGNOSIS — Z1389 Encounter for screening for other disorder: Secondary | ICD-10-CM

## 2019-12-02 DIAGNOSIS — E039 Hypothyroidism, unspecified: Secondary | ICD-10-CM

## 2019-12-02 DIAGNOSIS — I6521 Occlusion and stenosis of right carotid artery: Secondary | ICD-10-CM

## 2019-12-02 DIAGNOSIS — E785 Hyperlipidemia, unspecified: Secondary | ICD-10-CM

## 2019-12-02 MED ORDER — AMLODIPINE BESYLATE 5 MG PO TABS: 5.0000 mg | ORAL_TABLET | Freq: Every day | ORAL | 1 refills | Status: DC

## 2019-12-02 MED ORDER — LEVOTHYROXINE SODIUM 25 MCG PO TABS: 37.5000 ug | ORAL_TABLET | Freq: Every day | ORAL | 3 refills | Status: DC

## 2019-12-02 NOTE — Telephone Encounter (Signed)
Patient scheduled for 10:00 am 12/29/19 for fasting labs.

## 2019-12-02 NOTE — Telephone Encounter (Signed)
Please call and schedule fasting labs 12/29/19 Thanks [place]

## 2019-12-03 ENCOUNTER — Other Ambulatory Visit: Payer: Self-pay | Admitting: Internal Medicine

## 2019-12-03 DIAGNOSIS — M47816 Spondylosis without myelopathy or radiculopathy, lumbar region: Secondary | ICD-10-CM

## 2019-12-03 MED ORDER — MELOXICAM 15 MG PO TABS: 15.0000 mg | ORAL_TABLET | Freq: Every day | ORAL | 1 refills | Status: DC | PRN

## 2019-12-29 ENCOUNTER — Other Ambulatory Visit (INDEPENDENT_AMBULATORY_CARE_PROVIDER_SITE_OTHER): Payer: PPO

## 2019-12-29 ENCOUNTER — Other Ambulatory Visit: Payer: Self-pay

## 2019-12-29 DIAGNOSIS — E785 Hyperlipidemia, unspecified: Secondary | ICD-10-CM | POA: Diagnosis not present

## 2019-12-29 DIAGNOSIS — Z1389 Encounter for screening for other disorder: Secondary | ICD-10-CM

## 2019-12-29 DIAGNOSIS — E039 Hypothyroidism, unspecified: Secondary | ICD-10-CM

## 2019-12-29 DIAGNOSIS — I10 Essential (primary) hypertension: Secondary | ICD-10-CM | POA: Diagnosis not present

## 2019-12-29 LAB — COMPREHENSIVE METABOLIC PANEL
ALT: 22 U/L (ref 0–35)
AST: 27 U/L (ref 0–37)
Albumin: 4.7 g/dL (ref 3.5–5.2)
Alkaline Phosphatase: 76 U/L (ref 39–117)
BUN: 15 mg/dL (ref 6–23)
CO2: 29 mEq/L (ref 19–32)
Calcium: 9.5 mg/dL (ref 8.4–10.5)
Chloride: 96 mEq/L (ref 96–112)
Creatinine, Ser: 0.75 mg/dL (ref 0.40–1.20)
GFR: 81.64 mL/min (ref >60.00)
Glucose, Bld: 87 mg/dL (ref 70–99)
Potassium: 4.7 mEq/L (ref 3.5–5.1)
Sodium: 133 mEq/L — ABNORMAL LOW (ref 135–145)
Total Bilirubin: 0.7 mg/dL (ref 0.2–1.2)
Total Protein: 6.4 g/dL (ref 6.0–8.3)

## 2019-12-29 LAB — CBC WITH DIFFERENTIAL/PLATELET
Basophils Absolute: 0 10*3/uL (ref 0.0–0.1)
Basophils Relative: 0.4 % (ref 0.0–3.0)
Eosinophils Absolute: 0.1 10*3/uL (ref 0.0–0.7)
Eosinophils Relative: 2.4 % (ref 0.0–5.0)
HCT: 38.2 % (ref 36.0–46.0)
Hemoglobin: 13.5 g/dL (ref 12.0–15.0)
Lymphocytes Relative: 15.7 % (ref 12.0–46.0)
Lymphs Abs: 0.7 10*3/uL (ref 0.7–4.0)
MCHC: 35.3 g/dL (ref 30.0–36.0)
MCV: 96.2 fl (ref 78.0–100.0)
Monocytes Absolute: 0.5 10*3/uL (ref 0.1–1.0)
Monocytes Relative: 11.1 % (ref 3.0–12.0)
Neutro Abs: 3.3 10*3/uL (ref 1.4–7.7)
Neutrophils Relative %: 70.4 % (ref 43.0–77.0)
Platelets: 236 10*3/uL (ref 150.0–400.0)
RBC: 3.97 Mil/uL (ref 3.87–5.11)
RDW: 11.9 % (ref 11.5–15.5)
WBC: 4.7 10*3/uL (ref 4.0–10.5)

## 2019-12-29 LAB — LIPID PANEL
Cholesterol: 182 mg/dL (ref 0–200)
HDL: 85.5 mg/dL (ref >39.00)
LDL Cholesterol: 86 mg/dL (ref 0–99)
NonHDL: 96.78
Total CHOL/HDL Ratio: 2
Triglycerides: 56 mg/dL (ref 0.0–149.0)
VLDL: 11.2 mg/dL (ref 0.0–40.0)

## 2019-12-29 LAB — TSH: TSH: 6.39 u[IU]/mL — ABNORMAL HIGH (ref 0.35–4.50)

## 2019-12-29 NOTE — Addendum Note (Signed)
Addended by: Tor Netters I on: 12/29/2019 01:02 PM   Modules accepted: Orders

## 2019-12-30 ENCOUNTER — Other Ambulatory Visit (INDEPENDENT_AMBULATORY_CARE_PROVIDER_SITE_OTHER): Payer: PPO

## 2019-12-30 DIAGNOSIS — Z1389 Encounter for screening for other disorder: Secondary | ICD-10-CM

## 2019-12-31 DIAGNOSIS — Z23 Encounter for immunization: Secondary | ICD-10-CM | POA: Diagnosis not present

## 2019-12-31 LAB — URINALYSIS, ROUTINE W REFLEX MICROSCOPIC
Bilirubin Urine: NEGATIVE
Glucose, UA: NEGATIVE
Hgb urine dipstick: NEGATIVE
Ketones, ur: NEGATIVE
Leukocytes,Ua: NEGATIVE
Nitrite: NEGATIVE
Protein, ur: NEGATIVE
Specific Gravity, Urine: 1.013 (ref 1.001–1.03)
pH: 5.5 (ref 5.0–8.0)

## 2020-01-05 ENCOUNTER — Other Ambulatory Visit: Payer: Self-pay | Admitting: Internal Medicine

## 2020-01-05 DIAGNOSIS — E039 Hypothyroidism, unspecified: Secondary | ICD-10-CM

## 2020-01-05 MED ORDER — LEVOTHYROXINE SODIUM 50 MCG PO TABS: 50.0000 ug | ORAL_TABLET | Freq: Every day | ORAL | 3 refills | Status: DC

## 2020-01-12 NOTE — Discharge Instructions (Signed)
Instructions after Total Hip Replacement     Helen Snow, Jr., M.D.     Dept. of Orthopaedics & Sports Medicine  [facility]  [address]  [place],  [place]  [address]  Phone: [phone]   Fax: [phone]    DIET: . Drink plenty of non-alcoholic fluids. . Resume your normal diet. Include foods high in fiber.  ACTIVITY:  . You may use crutches or a walker with weight-bearing as tolerated, unless instructed otherwise. . You may be weaned off of the walker or crutches by your Physical Therapist.  . Do NOT reach below the level of your knees or cross your legs until allowed.    . Continue doing gentle exercises. Exercising will reduce the pain and swelling, increase motion, and prevent muscle weakness.   . Please continue to use the TED compression stockings for 6 weeks. You may remove the stockings at night, but should reapply them in the morning. . Do not drive or operate any equipment until instructed.  WOUND CARE:  . Continue to use ice packs periodically to reduce pain and swelling. . Keep the incision clean and dry. . You may bathe or shower after the staples are removed at the first office visit following surgery.  MEDICATIONS: . You may resume your regular medications. . Please take the pain medication as prescribed on the medication. . Do not take pain medication on an empty stomach. . You have been given a prescription for a blood thinner to prevent blood clots. Please take the medication as instructed. (NOTE: After completing a 2 week course of Lovenox, take one Enteric-coated aspirin once a day.) . Pain medications and iron supplements can cause constipation. Use a stool softener (Senokot or Colace) on a daily basis and a laxative (dulcolax or miralax) as needed. . Do not drive or drink alcoholic beverages when taking pain medications.  CALL THE OFFICE FOR: . Temperature above 101 degrees . Excessive bleeding or drainage on the dressing. . Excessive  swelling, coldness, or paleness of the toes. . Persistent nausea and vomiting.  FOLLOW-UP:  . You should have an appointment to return to the office in 6 weeks after surgery. . Arrangements have been made for continuation of Physical Therapy (either home therapy or outpatient therapy).     [facility] Department Directory         [email or web address]       [email or web address]          Cardiology  Appointments: [place] - [phone] [place] - [phone]  Endocrinology  Appointments: [place] - [phone] [place] - [phone]  Gastroenterology  Appointments: [place] - [phone] [place] - [phone]        General Surgery   Appointments: [place] - [phone]  Internal Medicine/Family Medicine  Appointments: [place] - [phone] [place] - [phone] [place] - [phone]  Metabolic and Weigh Loss Surgery  Appointments: [place] - [phone]        Neurology  Appointments: [place] - [phone] [place] - [phone]  Neurosurgery  Appointments: [place] - [phone]  Obstetrics & Gynecology  Appointments: [place] - [phone] [place] - [phone]        Pediatrics  Appointments: [place] - [phone] [place] - [phone]  Physiatry  Appointments: [place] -[phone]  Physical Therapy  Appointments: [place] - [phone] [place] - [phone]        Podiatry  Appointments: [place] - [phone] [place] - [phone]  Pulmonology  Appointments: [place] - [phone]  Rheumatology  Appointments:  - [phone]         Location: [facility]  [address] [place], [place]  [address]  [place] Location: [facility] [address] [place], [place]  [address]  [place] Location: [facility] [address] [place], [place]  [address]    

## 2020-01-18 ENCOUNTER — Encounter
Admission: RE | Admit: 2020-01-18 | Discharge: 2020-01-18 | Disposition: A | Discharge status: Home / Self Care | Payer: PPO | Source: Ambulatory Visit | Attending: Orthopedic Surgery | Admitting: Orthopedic Surgery

## 2020-01-18 ENCOUNTER — Other Ambulatory Visit: Payer: Self-pay

## 2020-01-18 DIAGNOSIS — Z01818 Encounter for other preprocedural examination: Secondary | ICD-10-CM | POA: Diagnosis not present

## 2020-01-18 HISTORY — DX: Age-related osteoporosis without current pathological fracture: M81.0

## 2020-01-18 HISTORY — DX: Hypothyroidism, unspecified: E03.9

## 2020-01-18 LAB — CBC
HCT: 41 % (ref 36.0–46.0)
Hemoglobin: 14.1 g/dL (ref 12.0–15.0)
MCH: 33.9 pg (ref 26.0–34.0)
MCHC: 34.4 g/dL (ref 30.0–36.0)
MCV: 98.6 fL (ref 80.0–100.0)
Platelets: 222 10*3/uL (ref 150–400)
RBC: 4.16 MIL/uL (ref 3.87–5.11)
RDW: 11.5 % (ref 11.5–15.5)
WBC: 8.6 10*3/uL (ref 4.0–10.5)
nRBC: 0 % (ref 0.0–0.2)

## 2020-01-18 LAB — APTT: aPTT: 30 seconds (ref 24–36)

## 2020-01-18 LAB — COMPREHENSIVE METABOLIC PANEL
ALT: 24 U/L (ref 0–44)
AST: 29 U/L (ref 15–41)
Albumin: 4.9 g/dL (ref 3.5–5.0)
Alkaline Phosphatase: 75 U/L (ref 38–126)
Anion gap: 11 (ref 5–15)
BUN: 18 mg/dL (ref 8–23)
CO2: 27 mmol/L (ref 22–32)
Calcium: 9.6 mg/dL (ref 8.9–10.3)
Chloride: 97 mmol/L — ABNORMAL LOW (ref 98–111)
Creatinine, Ser: 0.65 mg/dL (ref 0.44–1.00)
GFR, Estimated: >60 mL/min (ref >60)
Glucose, Bld: 91 mg/dL (ref 70–99)
Potassium: 3.8 mmol/L (ref 3.5–5.1)
Sodium: 135 mmol/L (ref 135–145)
Total Bilirubin: 0.7 mg/dL (ref 0.3–1.2)
Total Protein: 7.5 g/dL (ref 6.5–8.1)

## 2020-01-18 LAB — URINALYSIS, ROUTINE W REFLEX MICROSCOPIC
Bilirubin Urine: NEGATIVE
Glucose, UA: NEGATIVE mg/dL
Hgb urine dipstick: NEGATIVE
Ketones, ur: NEGATIVE mg/dL
Leukocytes,Ua: NEGATIVE
Nitrite: NEGATIVE
Protein, ur: NEGATIVE mg/dL
Specific Gravity, Urine: 1.01 (ref 1.005–1.030)
pH: 6 (ref 5.0–8.0)

## 2020-01-18 LAB — SURGICAL PCR SCREEN
MRSA, PCR: NEGATIVE
Staphylococcus aureus: NEGATIVE

## 2020-01-18 LAB — TYPE AND SCREEN

## 2020-01-18 LAB — PROTIME-INR
INR: 1 (ref 0.8–1.2)
Prothrombin Time: 12.7 seconds (ref 11.4–15.2)

## 2020-01-18 LAB — C-REACTIVE PROTEIN: CRP: 0.6 mg/dL (ref <1.0)

## 2020-01-18 LAB — SEDIMENTATION RATE: Sed Rate: 6 mm/hr (ref 0–30)

## 2020-01-18 NOTE — Patient Instructions (Addendum)
Your procedure is scheduled on: Wed. 11/17 Report to registration Desk in the [facility] then go to 2nd floor surgery desk To find out your arrival time please call [phone] between [place] on Tues 11/16.  Remember: Instructions that are not followed completely may result in serious medical risk,  up to and including death, or upon the discretion of your surgeon and anesthesiologist your  surgery may need to be rescheduled.     _X__ 1. Do not eat food after midnight the night before your procedure.                 No chewing gum or hard candies. You may drink clear liquids up to 2 hours                 before you are scheduled to arrive for your surgery- DO not drink clear                 liquids within 2 hours of the start of your surgery.                 Clear Liquids include:  water, apple juice without pulp, clear Gatorade, G2 or                  Gatorade Zero (avoid Red/Purple/Blue), Black Coffee or Tea (Do not add                 anything to coffee or tea). __x___2.   Complete the "Ensure Clear Pre-surgery Clear Carbohydrate Drink" provided to you, 2 hours before arrival. **If you       are diabetic you will be provided with an alternative drink, Gatorade Zero or G2.  __X__2.  On the morning of surgery brush your teeth with toothpaste and water, you                may rinse your mouth with mouthwash if you wish.  Do not swallow any toothpaste of mouthwash.     ___ 3.  No Alcohol for 24 hours before or after surgery.   ___ 4.  Do Not Smoke or use e-cigarettes For 24 Hours Prior to Your Surgery.                 Do not use any chewable tobacco products for at least 6 hours prior to                 Surgery.  ___  5.  Do not use any recreational drugs (marijuana, cocaine, heroin, ecstasy, MDMA or other)                For at least one week prior to your surgery.  Combination of these drugs with anesthesia                May have life threatening  results.  ____  6.  Bring all medications with you on the day of surgery if instructed.   __x__  7.  Notify your doctor if there is any change in your medical condition      (cold, fever, infections).     Do not wear jewelry, make-up, hairpins, clips or nail polish. Do not wear lotions, powders, or perfumes. You may wear deodorant. Do not shave 48 hours prior to surgery. Men may shave face and neck. Do not bring valuables to the hospital.    [facility] is not responsible for any belongings or valuables.  Contacts,  dentures or bridgework may not be worn into surgery. Leave your suitcase in the car. After surgery it may be brought to your room. For patients admitted to the hospital, discharge time is determined by your treatment team.   Patients discharged the day of surgery will not be allowed to drive home.   Make arrangements for someone to be with you for the first 24 hours of your Same Day Discharge.    Please read over the following fact sheets that you were given:   Incentive spirometer   __x__ Take these medicines the morning of surgery with A SIP OF WATER:    1. ALPRAZolam (XANAX) 0.5 MG tablet if needed  2. amLODipine (NORVASC) 5 MG tablet  3. finasteride (PROSCAR) 5 MG tablet  4.ipratropium (ATROVENT) 0.06 % nasal spray  5.levothyroxine (SYNTHROID) 50 MCG tablet  6.  ____ Fleet Enema (as directed)   __x__ Use CHG Soap (or wipes) as directed  ____ Use Benzoyl Peroxide Gel as instructed  ____ Use inhalers on the day of surgery  ____ Stop metformin 2 days prior to surgery    ____ Take 1/2 of usual insulin dose the night before surgery. No insulin the morning          of surgery.   ____ Stop Coumadin/Plavix/aspirin  __x__ Stop Anti-inflammatories meloxicam (MOBIC) 15 MG tablet,Ibuprofen 200 MG CAPS or aspirin products tomorrow   _x___ Stop supplements until after surgery.  Turmeric 500 MG TABS,Coenzyme Q10 (CO Q-10) 100 MG CAPS,ascorbic acid (VITAMIN C) 500  MG tablet  May continue other supplements and vitamins.   ____ Bring C-Pap to the hospital.    If you have any questions regarding your pre-procedure instructions,  Please call Pre-admit Testing at [facility]

## 2020-01-19 ENCOUNTER — Telehealth: Payer: Self-pay | Admitting: Internal Medicine

## 2020-01-19 DIAGNOSIS — M1611 Unilateral primary osteoarthritis, right hip: Secondary | ICD-10-CM | POA: Diagnosis not present

## 2020-01-19 NOTE — Telephone Encounter (Signed)
Patient medication adherence to pravastatin

## 2020-01-20 LAB — URINE CULTURE
Culture: NO GROWTH
Special Requests: NORMAL

## 2020-01-24 ENCOUNTER — Other Ambulatory Visit: Payer: Self-pay

## 2020-01-24 ENCOUNTER — Other Ambulatory Visit
Admission: RE | Admit: 2020-01-24 | Discharge: 2020-01-24 | Disposition: A | Discharge status: Home / Self Care | Payer: PPO | Source: Ambulatory Visit | Attending: Orthopedic Surgery | Admitting: Orthopedic Surgery

## 2020-01-24 DIAGNOSIS — I951 Orthostatic hypotension: Secondary | ICD-10-CM | POA: Diagnosis not present

## 2020-01-24 DIAGNOSIS — Z888 Allergy status to other drugs, medicaments and biological substances status: Secondary | ICD-10-CM | POA: Diagnosis not present

## 2020-01-24 DIAGNOSIS — Z9221 Personal history of antineoplastic chemotherapy: Secondary | ICD-10-CM | POA: Diagnosis not present

## 2020-01-24 DIAGNOSIS — L280 Lichen simplex chronicus: Secondary | ICD-10-CM | POA: Diagnosis present

## 2020-01-24 DIAGNOSIS — I10 Essential (primary) hypertension: Secondary | ICD-10-CM | POA: Diagnosis present

## 2020-01-24 DIAGNOSIS — E785 Hyperlipidemia, unspecified: Secondary | ICD-10-CM | POA: Diagnosis present

## 2020-01-24 DIAGNOSIS — Z01812 Encounter for preprocedural laboratory examination: Secondary | ICD-10-CM | POA: Insufficient documentation

## 2020-01-24 DIAGNOSIS — M25552 Pain in left hip: Secondary | ICD-10-CM | POA: Diagnosis present

## 2020-01-24 DIAGNOSIS — Z8249 Family history of ischemic heart disease and other diseases of the circulatory system: Secondary | ICD-10-CM | POA: Diagnosis not present

## 2020-01-24 DIAGNOSIS — K224 Dyskinesia of esophagus: Secondary | ICD-10-CM | POA: Diagnosis present

## 2020-01-24 DIAGNOSIS — Z01810 Encounter for preprocedural cardiovascular examination: Secondary | ICD-10-CM | POA: Diagnosis not present

## 2020-01-24 DIAGNOSIS — F329 Major depressive disorder, single episode, unspecified: Secondary | ICD-10-CM | POA: Diagnosis not present

## 2020-01-24 DIAGNOSIS — M1611 Unilateral primary osteoarthritis, right hip: Secondary | ICD-10-CM | POA: Diagnosis present

## 2020-01-24 DIAGNOSIS — E7800 Pure hypercholesterolemia, unspecified: Secondary | ICD-10-CM | POA: Diagnosis not present

## 2020-01-24 DIAGNOSIS — F32A Depression, unspecified: Secondary | ICD-10-CM | POA: Diagnosis present

## 2020-01-24 DIAGNOSIS — Z471 Aftercare following joint replacement surgery: Secondary | ICD-10-CM | POA: Diagnosis not present

## 2020-01-24 DIAGNOSIS — Z833 Family history of diabetes mellitus: Secondary | ICD-10-CM | POA: Diagnosis not present

## 2020-01-24 DIAGNOSIS — M810 Age-related osteoporosis without current pathological fracture: Secondary | ICD-10-CM | POA: Diagnosis present

## 2020-01-24 DIAGNOSIS — Z741 Need for assistance with personal care: Secondary | ICD-10-CM | POA: Diagnosis not present

## 2020-01-24 DIAGNOSIS — M6281 Muscle weakness (generalized): Secondary | ICD-10-CM | POA: Diagnosis not present

## 2020-01-24 DIAGNOSIS — Z20822 Contact with and (suspected) exposure to covid-19: Secondary | ICD-10-CM | POA: Diagnosis present

## 2020-01-24 DIAGNOSIS — J45909 Unspecified asthma, uncomplicated: Secondary | ICD-10-CM | POA: Diagnosis present

## 2020-01-24 DIAGNOSIS — R2689 Other abnormalities of gait and mobility: Secondary | ICD-10-CM | POA: Diagnosis not present

## 2020-01-24 DIAGNOSIS — R278 Other lack of coordination: Secondary | ICD-10-CM | POA: Diagnosis not present

## 2020-01-24 DIAGNOSIS — E559 Vitamin D deficiency, unspecified: Secondary | ICD-10-CM | POA: Diagnosis not present

## 2020-01-24 DIAGNOSIS — Z923 Personal history of irradiation: Secondary | ICD-10-CM | POA: Diagnosis not present

## 2020-01-24 DIAGNOSIS — Z96641 Presence of right artificial hip joint: Secondary | ICD-10-CM | POA: Diagnosis not present

## 2020-01-24 DIAGNOSIS — Z85818 Personal history of malignant neoplasm of other sites of lip, oral cavity, and pharynx: Secondary | ICD-10-CM | POA: Diagnosis not present

## 2020-01-24 DIAGNOSIS — Z841 Family history of disorders of kidney and ureter: Secondary | ICD-10-CM | POA: Diagnosis not present

## 2020-01-24 DIAGNOSIS — E039 Hypothyroidism, unspecified: Secondary | ICD-10-CM | POA: Diagnosis present

## 2020-01-24 DIAGNOSIS — Z79890 Hormone replacement therapy (postmenopausal): Secondary | ICD-10-CM | POA: Diagnosis not present

## 2020-01-24 DIAGNOSIS — F419 Anxiety disorder, unspecified: Secondary | ICD-10-CM | POA: Diagnosis present

## 2020-01-24 DIAGNOSIS — Z79899 Other long term (current) drug therapy: Secondary | ICD-10-CM | POA: Diagnosis not present

## 2020-01-25 ENCOUNTER — Telehealth: Payer: Self-pay | Admitting: Internal Medicine

## 2020-01-25 LAB — SARS CORONAVIRUS 2 (TAT 6-24 HRS): SARS Coronavirus 2: NEGATIVE

## 2020-01-25 MED ORDER — CELECOXIB 200 MG PO CAPS: 400.0000 mg | ORAL_CAPSULE | Freq: Once | ORAL | Status: DC

## 2020-01-25 MED ORDER — TRANEXAMIC ACID-NACL 1000-0.7 MG/100ML-% IV SOLN: 1000.0000 mg | INTRAVENOUS | Status: DC

## 2020-01-25 MED ORDER — ORAL CARE MOUTH RINSE: 15.0000 mL | Freq: Once | OROMUCOSAL | Status: DC

## 2020-01-25 MED ORDER — DEXAMETHASONE SODIUM PHOSPHATE 10 MG/ML IJ SOLN: 8.0000 mg | Freq: Once | INTRAMUSCULAR | Status: DC

## 2020-01-25 MED ORDER — CEFAZOLIN SODIUM-DEXTROSE 2-4 GM/100ML-% IV SOLN: 2.0000 g | INTRAVENOUS | Status: DC

## 2020-01-25 MED ORDER — LACTATED RINGERS IV SOLN: INTRAVENOUS | Status: DC

## 2020-01-25 MED ORDER — CHLORHEXIDINE GLUCONATE 4 % EX LIQD: 60.0000 mL | Freq: Once | CUTANEOUS | Status: DC

## 2020-01-25 MED ORDER — CHLORHEXIDINE GLUCONATE 0.12 % MT SOLN: 15.0000 mL | Freq: Once | OROMUCOSAL | Status: DC

## 2020-01-25 MED ORDER — GABAPENTIN 300 MG PO CAPS: 300.0000 mg | ORAL_CAPSULE | Freq: Once | ORAL | Status: DC

## 2020-01-25 MED ORDER — FAMOTIDINE 20 MG PO TABS: 20.0000 mg | ORAL_TABLET | Freq: Once | ORAL | Status: DC

## 2020-01-25 NOTE — Telephone Encounter (Addendum)
Faxed to [facility]/pharmacy request for refill or new prescription for levothyroxine 25 MG tablet 90 faxed on 01-24-2020

## 2020-01-26 ENCOUNTER — Inpatient Hospital Stay: Payer: PPO

## 2020-01-26 ENCOUNTER — Inpatient Hospital Stay: Payer: PPO | Admitting: Registered Nurse

## 2020-01-26 ENCOUNTER — Encounter: Payer: Self-pay | Admitting: Orthopedic Surgery

## 2020-01-26 ENCOUNTER — Encounter
Admission: RE | Disposition: A | Discharge status: Skilled Nursing Facility | Payer: Self-pay | Source: Home / Self Care | Attending: Orthopedic Surgery

## 2020-01-26 ENCOUNTER — Inpatient Hospital Stay
Admission: RE | Admit: 2020-01-26 | Discharge: 2020-01-28 | DRG: 470 | Disposition: A | Discharge status: Skilled Nursing Facility | Payer: PPO | Attending: Orthopedic Surgery | Admitting: Orthopedic Surgery

## 2020-01-26 ENCOUNTER — Other Ambulatory Visit: Payer: Self-pay

## 2020-01-26 DIAGNOSIS — Z8249 Family history of ischemic heart disease and other diseases of the circulatory system: Secondary | ICD-10-CM | POA: Diagnosis not present

## 2020-01-26 DIAGNOSIS — Z79899 Other long term (current) drug therapy: Secondary | ICD-10-CM | POA: Diagnosis not present

## 2020-01-26 DIAGNOSIS — Z79890 Hormone replacement therapy (postmenopausal): Secondary | ICD-10-CM | POA: Diagnosis not present

## 2020-01-26 DIAGNOSIS — E785 Hyperlipidemia, unspecified: Secondary | ICD-10-CM | POA: Diagnosis present

## 2020-01-26 DIAGNOSIS — Z85818 Personal history of malignant neoplasm of other sites of lip, oral cavity, and pharynx: Secondary | ICD-10-CM | POA: Diagnosis not present

## 2020-01-26 DIAGNOSIS — F419 Anxiety disorder, unspecified: Secondary | ICD-10-CM | POA: Diagnosis present

## 2020-01-26 DIAGNOSIS — M25552 Pain in left hip: Secondary | ICD-10-CM | POA: Diagnosis present

## 2020-01-26 DIAGNOSIS — Z888 Allergy status to other drugs, medicaments and biological substances status: Secondary | ICD-10-CM | POA: Diagnosis not present

## 2020-01-26 DIAGNOSIS — Z833 Family history of diabetes mellitus: Secondary | ICD-10-CM

## 2020-01-26 DIAGNOSIS — Z841 Family history of disorders of kidney and ureter: Secondary | ICD-10-CM

## 2020-01-26 DIAGNOSIS — M1611 Unilateral primary osteoarthritis, right hip: Secondary | ICD-10-CM | POA: Diagnosis present

## 2020-01-26 DIAGNOSIS — E039 Hypothyroidism, unspecified: Secondary | ICD-10-CM | POA: Diagnosis present

## 2020-01-26 DIAGNOSIS — I951 Orthostatic hypotension: Secondary | ICD-10-CM | POA: Diagnosis not present

## 2020-01-26 DIAGNOSIS — Z96641 Presence of right artificial hip joint: Secondary | ICD-10-CM

## 2020-01-26 DIAGNOSIS — Z20822 Contact with and (suspected) exposure to covid-19: Secondary | ICD-10-CM | POA: Diagnosis present

## 2020-01-26 DIAGNOSIS — M810 Age-related osteoporosis without current pathological fracture: Secondary | ICD-10-CM | POA: Diagnosis present

## 2020-01-26 DIAGNOSIS — F32A Depression, unspecified: Secondary | ICD-10-CM | POA: Diagnosis present

## 2020-01-26 DIAGNOSIS — Z9221 Personal history of antineoplastic chemotherapy: Secondary | ICD-10-CM

## 2020-01-26 DIAGNOSIS — J45909 Unspecified asthma, uncomplicated: Secondary | ICD-10-CM | POA: Diagnosis present

## 2020-01-26 DIAGNOSIS — K224 Dyskinesia of esophagus: Secondary | ICD-10-CM | POA: Diagnosis present

## 2020-01-26 DIAGNOSIS — L280 Lichen simplex chronicus: Secondary | ICD-10-CM | POA: Diagnosis present

## 2020-01-26 DIAGNOSIS — I10 Essential (primary) hypertension: Secondary | ICD-10-CM | POA: Diagnosis present

## 2020-01-26 DIAGNOSIS — Z923 Personal history of irradiation: Secondary | ICD-10-CM

## 2020-01-26 DIAGNOSIS — Z96649 Presence of unspecified artificial hip joint: Secondary | ICD-10-CM

## 2020-01-26 HISTORY — PX: TOTAL HIP ARTHROPLASTY: SHX124

## 2020-01-26 LAB — TYPE AND SCREEN
ABO/RH(D): O NEG
Antibody Screen: NEGATIVE

## 2020-01-26 LAB — ABO/RH: ABO/RH(D): O NEG

## 2020-01-26 SURGERY — ARTHROPLASTY, HIP, TOTAL,POSTERIOR APPROACH
Anesthesia: Spinal | Site: Hip | Laterality: Right

## 2020-01-26 MED ORDER — CALCIUM CITRATE-VITAMIN D 500-500 MG-UNIT PO CHEW
1.0000 | CHEWABLE_TABLET | Freq: Every day | ORAL | Status: DC
  Administered 2020-01-27 – 2020-01-28 (×2): 1 via ORAL
  Filled 2020-01-26 (×3): qty 1

## 2020-01-26 MED ORDER — MIDAZOLAM HCL 5 MG/5ML IJ SOLN
INTRAMUSCULAR | Status: DC | PRN
  Administered 2020-01-26: 2 mg via INTRAVENOUS

## 2020-01-26 MED ORDER — ONDANSETRON HCL 4 MG/2ML IJ SOLN: 4.0000 mg | Freq: Four times a day (QID) | INTRAMUSCULAR | Status: DC | PRN

## 2020-01-26 MED ORDER — TRAMADOL HCL 50 MG PO TABS
50.0000 mg | ORAL_TABLET | ORAL | Status: DC | PRN
  Administered 2020-01-27 (×2): 50 mg via ORAL
  Filled 2020-01-26 (×2): qty 1

## 2020-01-26 MED ORDER — LACTATED RINGERS IV SOLN: INTRAVENOUS | Status: DC | PRN

## 2020-01-26 MED ORDER — VITAMIN K2 100 MCG PO TABS: 100.0000 ug | ORAL_TABLET | Freq: Every day | ORAL | Status: DC

## 2020-01-26 MED ORDER — ASCORBIC ACID 500 MG PO TABS
500.0000 mg | ORAL_TABLET | Freq: Two times a day (BID) | ORAL | Status: DC
  Administered 2020-01-26 – 2020-01-28 (×4): 500 mg via ORAL
  Filled 2020-01-26 (×4): qty 1

## 2020-01-26 MED ORDER — CEFAZOLIN SODIUM-DEXTROSE 2-3 GM-%(50ML) IV SOLR
INTRAVENOUS | Status: DC | PRN
  Administered 2020-01-26: 2 g via INTRAVENOUS

## 2020-01-26 MED ORDER — FLEET ENEMA 7-19 GM/118ML RE ENEM: 1.0000 | ENEMA | Freq: Once | RECTAL | Status: DC | PRN

## 2020-01-26 MED ORDER — TRANEXAMIC ACID-NACL 1000-0.7 MG/100ML-% IV SOLN: 1000.0000 mg | Freq: Once | INTRAVENOUS | Status: AC

## 2020-01-26 MED ORDER — ONDANSETRON HCL 4 MG/2ML IJ SOLN
INTRAMUSCULAR | Status: AC
  Filled 2020-01-26: qty 2

## 2020-01-26 MED ORDER — IPRATROPIUM BROMIDE 0.06 % NA SOLN
2.0000 | Freq: Every morning | NASAL | Status: DC
  Administered 2020-01-27 – 2020-01-28 (×2): 2 via NASAL
  Filled 2020-01-26: qty 15

## 2020-01-26 MED ORDER — FENTANYL CITRATE (PF) 100 MCG/2ML IJ SOLN
INTRAMUSCULAR | Status: AC
  Filled 2020-01-26: qty 2

## 2020-01-26 MED ORDER — LEVOTHYROXINE SODIUM 50 MCG PO TABS
50.0000 ug | ORAL_TABLET | Freq: Every day | ORAL | Status: DC
  Administered 2020-01-27 – 2020-01-28 (×2): 50 ug via ORAL
  Filled 2020-01-26 (×2): qty 1

## 2020-01-26 MED ORDER — PROPOFOL 500 MG/50ML IV EMUL
INTRAVENOUS | Status: AC
  Filled 2020-01-26: qty 50

## 2020-01-26 MED ORDER — AMLODIPINE BESYLATE 5 MG PO TABS
5.0000 mg | ORAL_TABLET | Freq: Every day | ORAL | Status: DC
  Filled 2020-01-26: qty 1

## 2020-01-26 MED ORDER — CELECOXIB 200 MG PO CAPS
ORAL_CAPSULE | ORAL | Status: AC
  Administered 2020-01-26: 400 mg via ORAL
  Filled 2020-01-26: qty 2

## 2020-01-26 MED ORDER — GABAPENTIN 300 MG PO CAPS
ORAL_CAPSULE | ORAL | Status: AC
  Administered 2020-01-26: 300 mg via ORAL
  Filled 2020-01-26: qty 1

## 2020-01-26 MED ORDER — ACETAMINOPHEN 10 MG/ML IV SOLN
1000.0000 mg | Freq: Four times a day (QID) | INTRAVENOUS | Status: AC
  Administered 2020-01-26 – 2020-01-27 (×4): 1000 mg via INTRAVENOUS
  Filled 2020-01-26 (×4): qty 100

## 2020-01-26 MED ORDER — ALUM & MAG HYDROXIDE-SIMETH 200-200-20 MG/5ML PO SUSP: 30.0000 mL | ORAL | Status: DC | PRN

## 2020-01-26 MED ORDER — MIDAZOLAM HCL 2 MG/2ML IJ SOLN
INTRAMUSCULAR | Status: AC
  Filled 2020-01-26: qty 2

## 2020-01-26 MED ORDER — BISACODYL 10 MG RE SUPP
10.0000 mg | Freq: Every day | RECTAL | Status: DC | PRN
  Filled 2020-01-26: qty 1

## 2020-01-26 MED ORDER — FENTANYL CITRATE (PF) 100 MCG/2ML IJ SOLN
INTRAMUSCULAR | Status: DC | PRN
  Administered 2020-01-26 (×2): 50 ug via INTRAVENOUS

## 2020-01-26 MED ORDER — MENTHOL 3 MG MT LOZG
1.0000 | LOZENGE | OROMUCOSAL | Status: DC | PRN
  Filled 2020-01-26: qty 9

## 2020-01-26 MED ORDER — DIPHENHYDRAMINE HCL 25 MG PO CAPS: 25.0000 mg | ORAL_CAPSULE | Freq: Every evening | ORAL | Status: DC | PRN

## 2020-01-26 MED ORDER — GABAPENTIN 300 MG PO CAPS
300.0000 mg | ORAL_CAPSULE | Freq: Every day | ORAL | Status: DC
  Administered 2020-01-27: 300 mg via ORAL
  Filled 2020-01-26 (×2): qty 1

## 2020-01-26 MED ORDER — FERROUS SULFATE 325 (65 FE) MG PO TABS
325.0000 mg | ORAL_TABLET | Freq: Two times a day (BID) | ORAL | Status: DC
  Administered 2020-01-26 – 2020-01-28 (×4): 325 mg via ORAL
  Filled 2020-01-26 (×4): qty 1

## 2020-01-26 MED ORDER — GLYCOPYRROLATE 0.2 MG/ML IJ SOLN
INTRAMUSCULAR | Status: AC
  Filled 2020-01-26: qty 1

## 2020-01-26 MED ORDER — ACETAMINOPHEN 10 MG/ML IV SOLN: 1000.0000 mg | Freq: Four times a day (QID) | INTRAVENOUS | Status: DC

## 2020-01-26 MED ORDER — CELECOXIB 200 MG PO CAPS
200.0000 mg | ORAL_CAPSULE | Freq: Two times a day (BID) | ORAL | Status: DC
  Administered 2020-01-27 – 2020-01-28 (×3): 200 mg via ORAL
  Filled 2020-01-26 (×5): qty 1

## 2020-01-26 MED ORDER — CLOBETASOL PROPIONATE 0.05 % EX CREA
1.0000 "application " | TOPICAL_CREAM | Freq: Every day | CUTANEOUS | Status: DC | PRN
  Filled 2020-01-26: qty 15

## 2020-01-26 MED ORDER — PROPOFOL 500 MG/50ML IV EMUL
INTRAVENOUS | Status: DC | PRN
  Administered 2020-01-26: 120 ug/kg/min via INTRAVENOUS

## 2020-01-26 MED ORDER — BUPIVACAINE HCL (PF) 0.5 % IJ SOLN
INTRAMUSCULAR | Status: AC
  Filled 2020-01-26: qty 10

## 2020-01-26 MED ORDER — ENSURE PRE-SURGERY PO LIQD
296.0000 mL | Freq: Once | ORAL | Status: DC
  Filled 2020-01-26: qty 296

## 2020-01-26 MED ORDER — PHENOL 1.4 % MT LIQD
1.0000 | OROMUCOSAL | Status: DC | PRN
  Filled 2020-01-26: qty 177

## 2020-01-26 MED ORDER — PHENYLEPHRINE HCL (PRESSORS) 10 MG/ML IV SOLN
INTRAVENOUS | Status: AC
  Filled 2020-01-26: qty 1

## 2020-01-26 MED ORDER — FENTANYL CITRATE (PF) 100 MCG/2ML IJ SOLN
25.0000 ug | INTRAMUSCULAR | Status: DC | PRN
  Administered 2020-01-26: 25 ug via INTRAVENOUS

## 2020-01-26 MED ORDER — LISINOPRIL 20 MG PO TABS: 40.0000 mg | ORAL_TABLET | Freq: Every day | ORAL | Status: DC

## 2020-01-26 MED ORDER — SENNOSIDES-DOCUSATE SODIUM 8.6-50 MG PO TABS
1.0000 | ORAL_TABLET | Freq: Two times a day (BID) | ORAL | Status: DC
  Administered 2020-01-26 – 2020-01-28 (×3): 1 via ORAL
  Filled 2020-01-26 (×4): qty 1

## 2020-01-26 MED ORDER — ONDANSETRON HCL 4 MG/2ML IJ SOLN
INTRAMUSCULAR | Status: DC | PRN
  Administered 2020-01-26: 4 mg via INTRAVENOUS

## 2020-01-26 MED ORDER — CEFAZOLIN SODIUM-DEXTROSE 2-4 GM/100ML-% IV SOLN
2.0000 g | Freq: Four times a day (QID) | INTRAVENOUS | Status: AC
  Administered 2020-01-26 (×2): 2 g via INTRAVENOUS
  Filled 2020-01-26 (×2): qty 100

## 2020-01-26 MED ORDER — BUPIVACAINE HCL (PF) 0.5 % IJ SOLN
INTRAMUSCULAR | Status: DC | PRN
  Administered 2020-01-26: 3 mL

## 2020-01-26 MED ORDER — PANTOPRAZOLE SODIUM 40 MG PO TBEC
40.0000 mg | DELAYED_RELEASE_TABLET | Freq: Two times a day (BID) | ORAL | Status: DC
  Administered 2020-01-26 – 2020-01-28 (×4): 40 mg via ORAL
  Filled 2020-01-26 (×4): qty 1

## 2020-01-26 MED ORDER — OXYCODONE HCL 5 MG PO TABS
ORAL_TABLET | ORAL | Status: AC
  Administered 2020-01-26: 5 mg via ORAL
  Filled 2020-01-26: qty 1

## 2020-01-26 MED ORDER — TRANEXAMIC ACID-NACL 1000-0.7 MG/100ML-% IV SOLN
INTRAVENOUS | Status: AC
  Administered 2020-01-26: 1000 mg via INTRAVENOUS
  Filled 2020-01-26: qty 100

## 2020-01-26 MED ORDER — CEFAZOLIN SODIUM-DEXTROSE 2-4 GM/100ML-% IV SOLN
INTRAVENOUS | Status: AC
  Filled 2020-01-26: qty 100

## 2020-01-26 MED ORDER — FINASTERIDE 5 MG PO TABS
5.0000 mg | ORAL_TABLET | Freq: Every day | ORAL | Status: DC
  Administered 2020-01-27 – 2020-01-28 (×2): 5 mg via ORAL
  Filled 2020-01-26 (×2): qty 1

## 2020-01-26 MED ORDER — DEXAMETHASONE SODIUM PHOSPHATE 10 MG/ML IJ SOLN
INTRAMUSCULAR | Status: AC
  Administered 2020-01-26: 8 mg via INTRAVENOUS
  Filled 2020-01-26: qty 1

## 2020-01-26 MED ORDER — EPHEDRINE 5 MG/ML INJ
INTRAVENOUS | Status: AC
  Filled 2020-01-26: qty 10

## 2020-01-26 MED ORDER — TRANEXAMIC ACID-NACL 1000-0.7 MG/100ML-% IV SOLN
INTRAVENOUS | Status: DC | PRN
  Administered 2020-01-26: 1000 mg via INTRAVENOUS

## 2020-01-26 MED ORDER — SODIUM CHLORIDE 0.9 % IV SOLN: INTRAVENOUS | Status: DC

## 2020-01-26 MED ORDER — ACETAMINOPHEN 10 MG/ML IV SOLN
INTRAVENOUS | Status: AC
  Filled 2020-01-26: qty 100

## 2020-01-26 MED ORDER — ACETAMINOPHEN 10 MG/ML IV SOLN
INTRAVENOUS | Status: DC | PRN
  Administered 2020-01-26: 1000 mg via INTRAVENOUS

## 2020-01-26 MED ORDER — FAMOTIDINE 20 MG PO TABS
ORAL_TABLET | ORAL | Status: AC
  Administered 2020-01-26: 20 mg via ORAL
  Filled 2020-01-26: qty 1

## 2020-01-26 MED ORDER — ENOXAPARIN SODIUM 30 MG/0.3ML ~~LOC~~ SOLN
30.0000 mg | Freq: Two times a day (BID) | SUBCUTANEOUS | Status: DC
  Administered 2020-01-27 – 2020-01-28 (×3): 30 mg via SUBCUTANEOUS
  Filled 2020-01-26 (×3): qty 0.3

## 2020-01-26 MED ORDER — OXYCODONE HCL 5 MG PO TABS: 5.0000 mg | ORAL_TABLET | Freq: Once | ORAL | Status: AC | PRN

## 2020-01-26 MED ORDER — HYDROMORPHONE HCL 1 MG/ML IJ SOLN: 0.5000 mg | INTRAMUSCULAR | Status: DC | PRN

## 2020-01-26 MED ORDER — FENTANYL CITRATE (PF) 100 MCG/2ML IJ SOLN
INTRAMUSCULAR | Status: AC
  Administered 2020-01-26: 25 ug via INTRAVENOUS
  Filled 2020-01-26: qty 2

## 2020-01-26 MED ORDER — MAGNESIUM HYDROXIDE 400 MG/5ML PO SUSP
30.0000 mL | Freq: Every day | ORAL | Status: DC
  Administered 2020-01-27 – 2020-01-28 (×2): 30 mL via ORAL
  Filled 2020-01-26 (×2): qty 30

## 2020-01-26 MED ORDER — PRAVASTATIN SODIUM 20 MG PO TABS
40.0000 mg | ORAL_TABLET | Freq: Every day | ORAL | Status: DC
  Administered 2020-01-26 – 2020-01-27 (×2): 40 mg via ORAL
  Filled 2020-01-26 (×2): qty 2

## 2020-01-26 MED ORDER — CHLORHEXIDINE GLUCONATE 0.12 % MT SOLN
OROMUCOSAL | Status: AC
  Administered 2020-01-26: 15 mL via OROMUCOSAL
  Filled 2020-01-26: qty 15

## 2020-01-26 MED ORDER — ACETAMINOPHEN 325 MG PO TABS: 325.0000 mg | ORAL_TABLET | Freq: Four times a day (QID) | ORAL | Status: DC | PRN

## 2020-01-26 MED ORDER — EPHEDRINE SULFATE 50 MG/ML IJ SOLN
INTRAMUSCULAR | Status: DC | PRN
  Administered 2020-01-26 (×3): 10 mg via INTRAVENOUS
  Administered 2020-01-26: 5 mg via INTRAVENOUS

## 2020-01-26 MED ORDER — OXYCODONE HCL 5 MG/5ML PO SOLN: 5.0000 mg | Freq: Once | ORAL | Status: AC | PRN

## 2020-01-26 MED ORDER — SODIUM CHLORIDE 0.9 % IV SOLN
INTRAVENOUS | Status: DC | PRN
  Administered 2020-01-26: 70 ug/min via INTRAVENOUS
  Administered 2020-01-26: 50 ug/min via INTRAVENOUS

## 2020-01-26 MED ORDER — ADULT MULTIVITAMIN W/MINERALS CH
1.0000 | ORAL_TABLET | Freq: Every day | ORAL | Status: DC
  Administered 2020-01-27 – 2020-01-28 (×2): 1 via ORAL
  Filled 2020-01-26 (×2): qty 1

## 2020-01-26 MED ORDER — ONDANSETRON HCL 4 MG PO TABS: 4.0000 mg | ORAL_TABLET | Freq: Four times a day (QID) | ORAL | Status: DC | PRN

## 2020-01-26 MED ORDER — OXYCODONE HCL 5 MG PO TABS
5.0000 mg | ORAL_TABLET | ORAL | Status: DC | PRN
  Filled 2020-01-26: qty 1

## 2020-01-26 MED ORDER — GLYCOPYRROLATE 0.2 MG/ML IJ SOLN
INTRAMUSCULAR | Status: DC | PRN
  Administered 2020-01-26: .2 mg via INTRAVENOUS

## 2020-01-26 MED ORDER — ALPRAZOLAM 0.5 MG PO TABS: 0.5000 mg | ORAL_TABLET | Freq: Every day | ORAL | Status: DC | PRN

## 2020-01-26 MED ORDER — TRANEXAMIC ACID-NACL 1000-0.7 MG/100ML-% IV SOLN
INTRAVENOUS | Status: AC
  Filled 2020-01-26: qty 100

## 2020-01-26 MED ORDER — METOCLOPRAMIDE HCL 10 MG PO TABS
10.0000 mg | ORAL_TABLET | Freq: Three times a day (TID) | ORAL | Status: DC
  Administered 2020-01-26 – 2020-01-28 (×5): 10 mg via ORAL
  Filled 2020-01-26 (×5): qty 1

## 2020-01-26 MED ORDER — DIPHENHYDRAMINE HCL 12.5 MG/5ML PO ELIX: 12.5000 mg | ORAL_SOLUTION | ORAL | Status: DC | PRN

## 2020-01-26 SURGICAL SUPPLY — 65 items
BLADE DRUM FLTD (BLADE) ×4 IMPLANT
BLADE SAW 90X25X1.19 OSCILLAT (BLADE) ×4 IMPLANT
CANISTER SUCT 1200ML W/VALVE (MISCELLANEOUS) ×2 IMPLANT
CANISTER SUCT 3000ML PPV (MISCELLANEOUS) ×4 IMPLANT
CARTRIDGE OIL MAESTRO DRILL (MISCELLANEOUS) ×1 IMPLANT
COVER WAND RF STERILE (DRAPES) ×2 IMPLANT
CUP ACET PNNCL SECTR W/GRIP 56 (Hips) ×1 IMPLANT
DIFFUSER DRILL AIR PNEUMATIC (MISCELLANEOUS) ×2 IMPLANT
DRAPE 3/4 80X56 (DRAPES) ×2 IMPLANT
DRAPE INCISE IOBAN 66X60 STRL (DRAPES) ×2 IMPLANT
DRSG DERMACEA 8X12 NADH (GAUZE/BANDAGES/DRESSINGS) ×2 IMPLANT
DRSG MEPILEX SACRM 8.7X9.8 (GAUZE/BANDAGES/DRESSINGS) ×2 IMPLANT
DRSG OPSITE POSTOP 4X12 (GAUZE/BANDAGES/DRESSINGS) ×2 IMPLANT
DRSG OPSITE POSTOP 4X14 (GAUZE/BANDAGES/DRESSINGS) IMPLANT
DRSG TEGADERM 4X4.75 (GAUZE/BANDAGES/DRESSINGS) ×2 IMPLANT
DURAPREP 26ML APPLICATOR (WOUND CARE) ×2 IMPLANT
ELECT REM PT RETURN 9FT ADLT (ELECTROSURGICAL) ×2
ELECTRODE REM PT RTRN 9FT ADLT (ELECTROSURGICAL) ×1 IMPLANT
GLOVE BIO SURGEON STRL SZ7.5 (GLOVE) ×4 IMPLANT
GLOVE BIOGEL M STRL SZ7.5 (GLOVE) ×4 IMPLANT
GLOVE BIOGEL PI IND STRL 7.5 (GLOVE) ×1 IMPLANT
GLOVE BIOGEL PI INDICATOR 7.5 (GLOVE) ×1
GLOVE INDICATOR 8.0 STRL GRN (GLOVE) ×2 IMPLANT
GOWN STRL REUS W/ TWL LRG LVL3 (GOWN DISPOSABLE) ×2 IMPLANT
GOWN STRL REUS W/ TWL XL LVL3 (GOWN DISPOSABLE) ×1 IMPLANT
GOWN STRL REUS W/TWL LRG LVL3 (GOWN DISPOSABLE) ×2
GOWN STRL REUS W/TWL XL LVL3 (GOWN DISPOSABLE) ×1
HEAD M SROM 36MM 2 (Hips) ×1 IMPLANT
HEMOVAC 400CC 10FR (MISCELLANEOUS) ×2 IMPLANT
HOLDER FOLEY CATH W/STRAP (MISCELLANEOUS) ×2 IMPLANT
HOOD PEEL AWAY FLYTE STAYCOOL (MISCELLANEOUS) ×4 IMPLANT
IRRIGATION SURGIPHOR STRL (IV SOLUTION) ×2 IMPLANT
KIT PEG BOARD PINK (KITS) ×2 IMPLANT
KIT TURNOVER KIT A (KITS) ×2 IMPLANT
LINER MARATHON 4MM 10DEG 36MM (Hips) ×2 IMPLANT
MANIFOLD NEPTUNE II (INSTRUMENTS) ×4 IMPLANT
NDL SAFETY ECLIPSE 18X1.5 (NEEDLE) ×1 IMPLANT
NEEDLE HYPO 18GX1.5 SHARP (NEEDLE) ×1
NS IRRIG 500ML POUR BTL (IV SOLUTION) ×2 IMPLANT
OIL CARTRIDGE MAESTRO DRILL (MISCELLANEOUS) ×2
PACK HIP PROSTHESIS (MISCELLANEOUS) ×2 IMPLANT
PENCIL SMOKE ULTRAEVAC 22 CON (MISCELLANEOUS) ×2 IMPLANT
PINN SECTOR W/GRIP ACE CUP 56 (Hips) ×2 IMPLANT
PULSAVAC PLUS IRRIG FAN TIP (DISPOSABLE) ×2
SCREW 6.5MMX25MM (Screw) ×2 IMPLANT
SCREW PINN CAN BONE 6.5MMX15MM (Screw) ×2 IMPLANT
SOL .9 NS 3000ML IRR  AL (IV SOLUTION) ×1
SOL .9 NS 3000ML IRR UROMATIC (IV SOLUTION) ×1 IMPLANT
SOL PREP PVP 2OZ (MISCELLANEOUS) ×2
SOLUTION PREP PVP 2OZ (MISCELLANEOUS) ×1 IMPLANT
SPONGE DRAIN TRACH 4X4 STRL 2S (GAUZE/BANDAGES/DRESSINGS) ×2 IMPLANT
SROM M HEAD 36MM 2 (Hips) ×2 IMPLANT
STAPLER SKIN PROX 35W (STAPLE) ×2 IMPLANT
STEM FEM CMNTLSS SM AML 13.5 (Hips) ×2 IMPLANT
SUT ETHIBOND #5 BRAIDED 30INL (SUTURE) ×2 IMPLANT
SUT VIC AB 0 CT1 36 (SUTURE) ×2 IMPLANT
SUT VIC AB 1 CT1 36 (SUTURE) ×4 IMPLANT
SUT VIC AB 2-0 CT1 27 (SUTURE) ×1
SUT VIC AB 2-0 CT1 TAPERPNT 27 (SUTURE) ×1 IMPLANT
SYR 20ML LL LF (SYRINGE) ×2 IMPLANT
TAPE CLOTH 3X10 WHT NS LF (GAUZE/BANDAGES/DRESSINGS) ×2 IMPLANT
TAPE TRANSPORE STRL 2 31045 (GAUZE/BANDAGES/DRESSINGS) ×2 IMPLANT
TIP FAN IRRIG PULSAVAC PLUS (DISPOSABLE) ×1 IMPLANT
TOWEL OR 17X26 4PK STRL BLUE (TOWEL DISPOSABLE) ×2 IMPLANT
TRAY FOLEY MTR SLVR 16FR STAT (SET/KITS/TRAYS/PACK) ×2 IMPLANT

## 2020-01-26 NOTE — Evaluation (Signed)
Physical Therapy Evaluation Patient Details Name: Helen Snow MRN: [id number] DOB: 1950/05/25 Today's Date: 01/26/2020   History of Present Illness  69 y/o female s/p R total hip replacement (posterior approach)  Clinical Impression  Pt eager to see what she could do, but appeared anxious about how much she would be able to do.  She c/o pain but states that relative to the pain she has had for the last month she is "used to it" and wants to try as much as she can.  Pt has been having soft blood pressure since sx, spoke with nursing about this prior to session and she couldn't get more pain meds prior to session but was cleared to work with PT.  She did well with supine exercises and was only minimally pain limited.  She needed some assist with getting to EOB and standing but was asymptomatic and felt ready to stand.  In standing pt very quickly had significant lightheadedness and though we tried to remain standing with deep breathing she was too symptomatic and ultimately we needed to sit relatively quickly.  Attempted to get BP in sitting, dynamap bottomed out w/o reading, in supine it did eventually read 100s/70s a few minutes later.  Deferred further mobility today and will see her tomorrow AM.    Follow Up Recommendations SNF    Equipment Recommendations   (TBD)    Recommendations for Other Services       Precautions / Restrictions Precautions Precautions: Posterior Hip Restrictions Weight Bearing Restrictions: Yes RLE Weight Bearing: Weight bearing as tolerated      Mobility  Bed Mobility Overal bed mobility: Needs Assistance Bed Mobility: Supine to Sit;Sit to Supine     Supine to sit: Min assist Sit to supine: Mod assist   General bed mobility comments: Pt showed good effort in getting to sitting, did need assist to manage LE and elevate torso    Transfers Overall transfer level: Needs assistance Equipment used: Rolling walker (2 wheeled) Transfers: Sit to/from  Stand Sit to Stand: Min assist         General transfer comment: Pt eager to try standing and managed to attain upright with min assist after some cuing, set up and encouragement.  In getting to standing pt with relatively quick onset of signficant lightheadedness.  After a few moments with calm, deep breaths symptoms sustained and she needed to sit back down (similarly symptoms remained in sitting and she ultimately needed to lay down while still attempting to get BP)  Ambulation/Gait             General Gait Details: deferred 2/2 safety concerns in standing  Stairs            Wheelchair Mobility    Modified Rankin (Stroke Patients Only)       Balance Overall balance assessment: Modified Independent                                           Pertinent Vitals/Pain Pain Assessment: 0-10 Pain Score: 8  Pain Location: R hip    Home Living Family/patient expects to be discharged to:: Private residence Living Arrangements: Alone Available Help at Discharge: Neighbor Type of Home:  (retirement community) Home Access: Level entry     Home Layout: One [place]: Clinical cytogeneticist - 2 wheels;Cane - single point;Cane - quad;Grab bars - toilet;Grab bars -  tub/shower      Prior Function Level of Independence: Independent         Comments: reports that until the last month she was driving, running errands, etc     Hand Dominance        Extremity/Trunk Assessment   Upper Extremity Assessment Upper Extremity Assessment: Generalized weakness;Overall [facility] for tasks assessed    Lower Extremity Assessment Lower Extremity Assessment: Generalized weakness;Overall WFL for tasks assessed (expected post-op weakness)       Communication   Communication: No difficulties  Cognition Arousal/Alertness: Awake/alert Behavior During Therapy: WFL for tasks assessed/performed Overall Cognitive Status: Within Functional Limits for tasks  assessed                                        General Comments      Exercises Total Joint Exercises Ankle Circles/Pumps: AROM;10 reps Quad Sets: Strengthening;10 reps Short Arc Quad: AROM;Strengthening;10 reps Heel Slides: AAROM;5 reps (with resisted leg extensions) Hip ABduction/ADduction: Strengthening;10 reps   Assessment/Plan    PT Assessment Patient needs continued PT services  PT Problem List Decreased strength;Decreased range of motion;Decreased activity tolerance;Decreased balance;Decreased mobility;Decreased knowledge of use of DME;Decreased safety awareness;Decreased knowledge of precautions;Cardiopulmonary status limiting activity;Pain       PT Treatment Interventions DME instruction;Gait training;Functional mobility training;Therapeutic activities;Therapeutic exercise;Balance training;Neuromuscular re-education;Patient/family education    PT Goals (Current goals can be found in the Care Plan section)  Acute Rehab PT Goals Patient Stated Goal: get back to walking PT Goal Formulation: With patient Time For Goal Achievement: 02/09/20 Potential to Achieve Goals: Fair    Frequency BID   Barriers to discharge        Co-evaluation               AM-PAC PT "6 Clicks" Mobility  Outcome Measure Help needed turning from your back to your side while in a flat bed without using bedrails?: A Little Help needed moving from lying on your back to sitting on the side of a flat bed without using bedrails?: A Little Help needed moving to and from a bed to a chair (including a wheelchair)?: A Lot Help needed standing up from a chair using your arms (e.g., wheelchair or bedside chair)?: A Little Help needed to walk in hospital room?: A Lot Help needed climbing 3-5 steps with a railing? : A Lot 6 Click Score: 15    End of Session Equipment Utilized During Treatment: Gait belt Activity Tolerance: Patient tolerated treatment well;Other (comment) (limited  due to (likely) orthostatic hypotension) Patient left: with bed alarm set;with call bell/phone within reach;with nursing/sitter in room Nurse Communication: Mobility status PT Visit Diagnosis: Muscle weakness (generalized) (M62.81);Difficulty in walking, not elsewhere classified (R26.2);Pain Pain - Right/Left: Right Pain - part of body: Hip    Time: 9826-4158 PT Time Calculation (min) (ACUTE ONLY): 33 min   Charges:   PT Evaluation $PT Eval Low Complexity: 1 Low PT Treatments $Therapeutic Exercise: 8-22 mins $Therapeutic Activity: 8-22 mins        Kreg Shropshire, DPT 01/26/2020, 5:35 PM

## 2020-01-26 NOTE — Op Note (Addendum)
OPERATIVE NOTE  DATE OF SURGERY:  01/26/2020  PATIENT NAME:  Helen Snow   DOB: 22-Feb-1951  MRN: [id number]  PRE-OPERATIVE DIAGNOSIS: Degenerative arthrosis of the right hip, primary  POST-OPERATIVE DIAGNOSIS:  Same  PROCEDURE:  Right total hip arthroplasty  SURGEON:  Marciano Sequin. M.D.  ASSISTANT: Cassell Smiles, PA-C (present and scrubbed throughout the case, critical for assistance with exposure, retraction, instrumentation, and closure)  ANESTHESIA: spinal  ESTIMATED BLOOD LOSS: 100 mL  FLUIDS REPLACED: 1000 mL of crystalloid  DRAINS: 2 medium Hemovac  IMPLANTS UTILIZED: DePuy 13.5 mm small stature AML femoral stem, 56 mm OD Pinnacle Gription Sector acetabular component, 2 - 6.5 mm cancellous screws, neutral Pinnacle Marathon polyethylene insert, and a 36 mm CoCr M-SPEC -2 mm hip ball  INDICATIONS FOR SURGERY: Helen Snow is a 69 y.o. year old female with a long history of progressive hip and groin  pain. X-rays demonstrated severe degenerative changes. The patient had not seen any significant improvement despite conservative nonsurgical intervention. After discussion of the risks and benefits of surgical intervention, the patient expressed understanding of the risks benefits and agree with plans for total hip arthroplasty.   The risks, benefits, and alternatives were discussed at length including but not limited to the risks of infection, bleeding, nerve injury, stiffness, blood clots, the need for revision surgery, limb length inequality, dislocation, cardiopulmonary complications, among others, and they were willing to proceed.  PROCEDURE IN DETAIL: The patient was brought into the operating room and, after adequate spinal anesthesia was achieved, the patient was placed in a left lateral decubitus position. Axillary roll was placed and all bony prominences were well-padded. The patient's right hip was cleaned and prepped with alcohol and DuraPrep and draped in the  usual sterile fashion. A "timeout" was performed as per usual protocol. A lateral curvilinear incision was made gently curving towards the posterior superior iliac spine. The IT band was incised in line with the skin incision and the fibers of the gluteus maximus were split in line. The piriformis tendon was identified, skeletonized, and incised at its insertion to the proximal femur and reflected posteriorly. A T type posterior capsulotomy was performed. Prior to dislocation of the femoral head, a threaded Steinmann pin was inserted through a separate stab incision into the pelvis superior to the acetabulum and bent in the form of a stylus so as to assess limb length and hip offset throughout the procedure. The femoral head was then dislocated posteriorly. Inspection of the femoral head demonstrated severe degenerative changes with full-thickness loss of articular cartilage. The femoral neck cut was performed using an oscillating saw. The anterior capsule was elevated off of the femoral neck using a periosteal elevator. Attention was then directed to the acetabulum. The remnant of the labrum was excised using electrocautery. Inspection of the acetabulum also demonstrated significant degenerative changes. The acetabulum was reamed in sequential fashion up to a 55 mm diameter. Good punctate bleeding bone was encountered. A 56 mm Pinnacle G[employer]ion Sector acetabular component was positioned and impacted into place. Good scratch fit was appreciated.  Two 6.5 mm cancellous screws were inserted for additional fixation.  A neutral polyethylene trial was inserted.  Attention was then directed to the proximal femur. A hole for reaming of the proximal femoral canal was created using a high-speed burr. The femoral canal was reamed in sequential fashion up to a 13 mm diameter. This allowed for approximately 6 cm of scratch fit.  It was thus elected to ream  up to a 13.5 mm diameter to allow for a line to line fit.  Serial  broaches were inserted up to a 13.5 mm small stature femoral broach. Calcar region was planed and a trial reduction was performed using a 36 mm hip ball with a -2 mm neck length. Good equalization of limb lengths and hip offset was appreciated and excellent stability was noted both anteriorly and posteriorly. Trial components were removed. The acetabular shell was irrigated with copious amounts of normal saline with antibiotic solution and suctioned dry. A neutral Pinnacle Marathon polyethylene insert was positioned and impacted into place. Next, a 13.5 mm small stature AML femoral stem was positioned and impacted into place. Excellent scratch fit was appreciated. A trial reduction was again performed with a 36 mm hip ball with a -2 mm neck length. Again, good equalization of limb lengths was appreciated and excellent stability appreciated both anteriorly and posteriorly. The hip was then dislocated and the trial hip ball was removed. The Morse taper was cleaned and dried. A 36 mm M-SPEC hip ball with a -2 mm neck length was placed on the trunnion and impacted into place. The hip was then reduced and placed through range of motion. Excellent stability was appreciated both anteriorly and posteriorly.  The wound was irrigated with copious amounts of normal saline followed by 500 ml of Surgiphor and suctioned dry. Good hemostasis was appreciated. The posterior capsulotomy was repaired using #5 Ethibond. Piriformis tendon was reapproximated to the undersurface of the gluteus medius tendon using #5 Ethibond.  2 medium Hemovac drains were placed in the wound bed and brought through separate stab incision to be attached to Hemovac reservoir.  The IT band was reapproximated using interrupted sutures of #1 Vicryl. Subcutaneous tissue was approximated using first #0 Vicryl followed by #2-0 Vicryl. The skin was closed with skin staples.  The patient tolerated the procedure well and was transported to the recovery room in  stable condition.   Marciano Sequin., M.D.

## 2020-01-26 NOTE — Anesthesia Procedure Notes (Signed)
Spinal  Patient location during procedure: OR Start time: 01/26/2020 8:30 AM Staffing Performed: resident/CRNA  Preanesthetic Checklist Completed: patient identified, IV checked, site marked, risks and benefits discussed, surgical consent, monitors and equipment checked, pre-op evaluation and timeout performed Spinal Block Patient position: sitting Prep: DuraPrep Patient monitoring: heart rate, cardiac monitor, continuous pulse ox and blood pressure Approach: midline Location: L3-4 Injection technique: single-shot Needle Needle type: Sprotte  Needle gauge: 24 G Needle length: 9 cm Assessment Sensory level: T4 Additional Notes Negative heme, negative paresthesia, no pain with injection/

## 2020-01-26 NOTE — H&P (Signed)
ORTHOPAEDIC HISTORY & PHYSICAL  Progress Notes Regino Bellow, PA - 01/19/2020 11:00 AM EST Chief Complaint Chief Complaint  Patient presents with  . Hip Pain  H & P RIGHT HIP   Reason for Visit Helen Snow is a 69 y.o. who presents today for history and physical. She is to undergo a right total hip arthroplasty on 01/26/2020. Patient was last seen in the clinic on 08/03/2019. There have been no change in her condition since that date. Patient states that she was initially scheduled for surgery in October but needed rehab and was not able to perform surgery at that time but now since rehab is open she is ready to proceed with surgery.  She reports a greater than 11-year history of bilateral hip and groin pain. Was noted to have bone scan performed initially which showed arthritic changes. The right hip is more symptomatic than the left. She first noticed the onset of some stiffness in November without any apparent trauma or aggravating event. She has noticed some popping and grinding to the right hip with range of motion. The pain is worse with weight bearing. The patient has not appreciated any significant improvement despite activity modification and NSAIDs. She is not using any ambulatory aids. The patient states that the hip pain has progressed to the point that it is beginning to interfere with her activities of daily living.  Patient somewhat concerned about her osteoporosis.  Past Medical History Past Medical History:  Diagnosis Date  . Allergic rhinitis  . Arthritis  . Depression  . Esophageal spasm  . HPV (human papilloma virus) infection  . Hyperlipidemia  . Hypertension  . Lichen plano-pilaris  scalp  . Osteoporosis  . Tonsil cancer (CMS-HCC)   Past Surgical History History reviewed. No pertinent surgical history.  Past Family History Family History  Problem Relation Age of Onset  . High blood pressure (Hypertension) Mother  . Hyperlipidemia (Elevated  cholesterol) Mother  . Kidney failure Mother  . No Known Problems Father  . Coronary Artery Disease (Blocked arteries around heart) Other  . Diabetes Other  . Thyroid disease Other   Medications Current Outpatient Medications Ordered in Epic  Medication Sig Dispense Refill  . acetaminophen (TYLENOL) 650 MG ER tablet Take 1,300 mg by mouth once daily  . ALPRAZolam (XANAX) 0.5 MG tablet 0.5 mg nightly as needed  . amLODIPine (NORVASC) 5 MG tablet Take 1 tablet (5 mg total) by mouth once daily 90 tablet 1  . ascorbic acid, vitamin C, (VITAMIN C) 500 MG tablet Take 500 mg by mouth 2 (two) times daily  . calcium citrate/vitamin D3 (CALCIUM CITRATE + ORAL) Take 1 tablet by mouth once daily  . clobetasol (TEMOVATE) 0.05 % ointment Apply topically once daily  . co-enzyme Q-10, ubiquinone, 30 mg capsule Take 100 mg by mouth once daily  . diphenhydrAMINE (BENADRYL) 25 mg capsule Take 25 mg by mouth every 6 (six) hours as needed for Itching  . FINASTERIDE ORAL Take 5 mg by mouth once daily  . ipratropium (ATROVENT) 0.06 % nasal spray 2 PUFFS EACH NOSTRIL AS NEEDED FOR RUNNY NOSE EVERY 8 HOURS  . levothyroxine (SYNTHROID) 50 MCG tablet 50 mcg once daily  . lisinopril (PRINIVIL,ZESTRIL) 40 MG tablet Take 1 tablet (40 mg total) by mouth once daily 90 tablet 3  . multivitamin tablet Take 1 tablet by mouth once daily  . naltrexone HCl, bulk, 100 % Powd Take 1 capsule by mouth once daily  . phytonadione, vit K1, (  VITAMIN K) 100 mcg tablet Take 100 mcg by mouth once daily  . pravastatin (PRAVACHOL) 40 MG tablet Take 1 tablet (40 mg total) by mouth nightly 90 tablet 3  . TURMERIC ORAL Take 500 mg by mouth once daily   No current Epic-ordered facility-administered medications on file.   Allergies Allergies  Allergen Reactions  . Actos [Pioglitazone] Swelling  Fluid retention    Review of Systems A comprehensive 14 point ROS was performed, reviewed, and the pertinent orthopaedic findings are  documented in the HPI.  Exam BP 120/70  Ht 165.1 cm (5\' 5" )  Wt 60.7 kg (133 lb 12.8 oz)  BMI 22.27 kg/m   General: Well-developed well-nourished female seen in no acute distress.   HEENT: Atraumatic,normocephalic. Pupils are equal and reactive to light. Oropharynx is clear with moist mucosa  Lungs: Clear to auscultation bilaterally   Cardiovascular: Regular rate and rhythm.. Did hear occasional dropped beat. Normal S1, S2. No murmurs. No appreciable gallops or rubs. Peripheral pulses are palpable.  Abdomen: Soft, non-tender, nondistended. Bowel sounds present  Extremity: Well-developed, well-nourished female seen in no acute distress. Antalgic gait without evidence of significant abductor lurch.  Right Hip: Pelvic tilt: Negative Limb lengths: Equal with the patient standing Soft tissue swelling: Negative Erythema: Negative Crepitance: Positive Tenderness: Greater trochanter is nontender to palpation. Mild pain is elicited by axial compression or extremes of rotation. Atrophy: No atrophy. Fair to good hip flexor and abductor strength. Range of Motion: EXT/FLEX: 10/0/100 ADD/ABD: 20/0/20 IR/ER: 0/0/30   Neurological:  The patient is alert and oriented Sensation to light touch appears to be intact and within normal limits Gross motor strength appeared to be equal to 5/5  Vascular :  Peripheral pulses felt to be palpable. Capillary refill appears to be intact and within normal limits No swelling or edema to the lower extremities  X-ray  1. 3 views of the right hip ordered and interpreted on today's visit showssignificant narrowing of the cartilage space, especially superiorly. Subchondral sclerosis is noted. Osteophyte formation is present.   Impression  1. Degenerative arthrosis right hip  Plan   1. Patient has discontinued any anticoagulation medication 2. Patient is planning on going to rehab following surgery. She is contacted [facility] as well as  [facility] [facility], 3. Discussed postop course 4. Return to clinic 6 weeks postop. Sooner if any problems  This note was generated in part with voice recognition software and I apologize for any typographical errors that were not detected and corrected   Watt Climes PA  Electronically signed by Regino Bellow, PA at 01/19/2020 11:54 AM EST

## 2020-01-26 NOTE — Transfer of Care (Signed)
Immediate Anesthesia Transfer of Care Note  Patient: Helen Snow  Procedure(s) Performed: TOTAL HIP ARTHROPLASTY (Right Hip)  Patient Location: PACU  Anesthesia Type:Spinal  Level of Consciousness: awake  Airway & Oxygen Therapy: Patient Spontanous Breathing and Patient connected to face mask oxygen  Post-op Assessment: Report given to RN  Post vital signs: stable  Last Vitals:  Vitals Value Taken Time  BP 99/60 01/26/20 1211  Temp 36.9 C 01/26/20 1211  Pulse 100 01/26/20 1212  Resp 24 01/26/20 1212  SpO2 100 % 01/26/20 1212  Vitals shown include unvalidated device data.  Last Pain:  Vitals:   01/26/20 5271  TempSrc: Tympanic  PainSc: 10-Worst pain ever         Complications: No complications documented.

## 2020-01-26 NOTE — Anesthesia Preprocedure Evaluation (Signed)
Anesthesia Evaluation  Patient identified by MRN, date of birth, ID band Patient awake    Reviewed: Allergy & Precautions, H&P , NPO status , Patient's Chart, lab work & pertinent test results  History of Anesthesia Complications Negative for: history of anesthetic complications  Airway Mallampati: III  TM Distance: >3 FB Neck ROM: full    Dental  (+) Chipped   Pulmonary neg shortness of breath, asthma ,    Pulmonary exam normal        Cardiovascular Exercise Tolerance: Good hypertension, (-) angina(-) Past MI and (-) DOE Normal cardiovascular exam     Neuro/Psych negative neurological ROS  negative psych ROS   GI/Hepatic negative GI ROS, Neg liver ROS, neg GERD  ,  Endo/Other  Hypothyroidism   Renal/GU      Musculoskeletal  (+) Arthritis ,   Abdominal   Peds  Hematology negative hematology ROS (+)   Anesthesia Other Findings Past Medical History: No date: Anxiety No date: Arthritis No date: Asthma     Comment:  as child 02/26/2016: Cancer ([place])     Comment:  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 Tami Ribas  No date: Hyperlipidemia No date: Hypertension No date: Hypothyroidism No date: Osteoporosis No date: Personal history of chemotherapy No date: Personal history of radiation therapy No date: Postmenopausal  Past Surgical History: No date: left tonsillar surgery     Comment:  02/26/16 invasive moderate SCC  BMI    Body Mass Index: 22.40 kg/m      Reproductive/Obstetrics negative OB ROS                             Anesthesia Physical Anesthesia Plan  ASA: III  Anesthesia Plan: Spinal   Post-op Pain Management:    Induction:   PONV Risk Score and Plan:   Airway Management Planned: Natural Airway and Nasal Cannula  Additional Equipment:   Intra-op Plan:   Post-operative Plan:   Informed Consent: I have reviewed  the patients History and Physical, chart, labs and discussed the procedure including the risks, benefits and alternatives for the proposed anesthesia with the patient or authorized representative who has indicated his/her understanding and acceptance.     Dental Advisory Given  Plan Discussed with: Anesthesiologist, CRNA and Surgeon  Anesthesia Plan Comments: (Patient reports no bleeding problems and no anticoagulant use.  Plan for spinal with backup GA  Patient consented for risks of anesthesia including but not limited to:  - adverse reactions to medications - damage to eyes, teeth, lips or other oral mucosa - nerve damage due to positioning  - risk of bleeding, infection and or nerve damage from spinal that could lead to paralysis - risk of headache or failed spinal - damage to teeth, lips or other oral mucosa - sore throat or hoarseness - damage to heart, brain, nerves, lungs, other parts of body or loss of life  Patient voiced understanding.)        Anesthesia Quick Evaluation

## 2020-01-27 ENCOUNTER — Encounter: Payer: Self-pay | Admitting: Orthopedic Surgery

## 2020-01-27 LAB — GLUCOSE, CAPILLARY: Glucose-Capillary: 88 mg/dL (ref 70–99)

## 2020-01-27 MED ORDER — SODIUM CHLORIDE 0.9 % IV BOLUS
500.0000 mL | Freq: Once | INTRAVENOUS | Status: AC
  Administered 2020-01-27: 500 mL via INTRAVENOUS

## 2020-01-27 NOTE — Evaluation (Signed)
Occupational Therapy Evaluation Patient Details Name: Helen Snow MRN: [id number] DOB: 09-23-1950 Today's Date: 01/27/2020    History of Present Illness 69 y/o female s/p R total hip replacement (posterior approach)   Clinical Impression   Patient presenting with decreased I in self care, balance, functional mobility/transfers, endurance, safety awareness, and knowledge of hip precautions. Patient reports being mod I PTA with use of AD as needed. Pt very fearful of pain with mobility and hypotension. Pt's BP in supine is 125/54 and 123/50 once seated EOB. Pt remains asymptomatic during session with standing and functional transfer. Pt has all AE at home needed and OT reviewed hip precautions and use of AE to increase independence while maintaining precautions.  Patient currently functioning at set up A - min A. Pt lives at home and does not have assistance at discharge and is agreeable to short term rehab stay. Patient will benefit from acute OT to increase overall independence in the areas of ADLs, functional mobility, and safety awarness in order to safely discharge to next venue of care.    Follow Up Recommendations  SNF    Equipment Recommendations  None recommended by OT       Precautions / Restrictions Precautions Precautions: Posterior Hip Precaution Booklet Issued: Yes (comment) Precaution Comments: WBAT Restrictions Weight Bearing Restrictions: Yes RLE Weight Bearing: Weight bearing as tolerated      Mobility Bed Mobility Overal bed mobility: Needs Assistance Bed Mobility: Supine to Sit;Sit to Supine     Supine to sit: Min guard     General bed mobility comments: min cuing for technique and hand placement    Transfers Overall transfer level: Needs assistance Equipment used: Rolling walker (2 wheeled) Transfers: Sit to/from Stand Sit to Stand: Min guard         General transfer comment: Pt was easily able to STS from recliner while adhereing to hip  precautions    Balance Overall balance assessment: Needs assistance Sitting-balance support: Feet supported Sitting balance-Leahy Scale: Good     Standing balance support: During functional activity Standing balance-Leahy Scale: Fair Standing balance comment: reliance on RW for UE support          ADL either performed or assessed with clinical judgement   ADL Overall ADL's : Needs assistance/impaired Eating/Feeding: Independent   Grooming: Wash/dry hands;Wash/dry face;Oral care;Set up;Sitting   Upper Body Bathing: Set up;Sitting   Lower Body Bathing: Minimal assistance;Sit to/from stand   Upper Body Dressing : Set up;Sitting   Lower Body Dressing: Minimal assistance;Sit to/from stand   Toilet Transfer: Minimal assistance;BSC   Toileting- Water quality scientist and Hygiene: Min guard;Sit to/from stand          Vision Patient Visual Report: No change from baseline              Pertinent Vitals/Pain Pain Assessment: 0-10 Pain Score: 7  Pain Location: R hip Pain Descriptors / Indicators: Aching;Discomfort Pain Intervention(s): Limited activity within patient's tolerance;Monitored during session;Repositioned;Premedicated before session     Hand Dominance Right   Extremity/Trunk Assessment Upper Extremity Assessment Upper Extremity Assessment: Generalized weakness;Overall WFL for tasks assessed   Lower Extremity Assessment Lower Extremity Assessment: Overall WFL for tasks assessed;Generalized weakness       Communication Communication Communication: No difficulties   Cognition Arousal/Alertness: Awake/alert Behavior During Therapy: WFL for tasks assessed/performed Overall Cognitive Status: Within Functional Limits for tasks assessed      General Comments: Pt is A and O x 4 and cooperative and pleasant throughout. able to  recall 2/3 hip precautions      Exercises Exercises: Total Joint (issued handout but will review in PM session)        Home  Living Family/patient expects to be discharged to:: Private residence Living Arrangements: Alone Available Help at Discharge: Neighbor Type of Home: Other(Comment) (retirement community) Home Access: Level entry     [place]: One level     Bathroom Shower/Tub: Occupational psychologist: Handicapped height Bathroom Accessibility: Yes   Home Equipment: Clinical cytogeneticist - 2 wheels;Cane - single point;Cane - quad;Grab bars - toilet;Grab bars - tub/shower          Prior Functioning/Environment Level of Independence: Independent        Comments: reports that until the last month she was driving, running errands, etc        OT Problem List: Decreased strength;Pain;Decreased activity tolerance;Decreased safety awareness;Decreased knowledge of use of DME or AE;Impaired balance (sitting and/or standing);Decreased knowledge of precautions      OT Treatment/Interventions: Self-care/ADL training;Therapeutic exercise;Therapeutic activities;DME and/or AE instruction;Patient/family education;Balance training;Energy conservation    OT Goals(Current goals can be found in the care plan section) Acute Rehab OT Goals Patient Stated Goal: return home after rehab OT Goal Formulation: With patient Time For Goal Achievement: 02/10/20 Potential to Achieve Goals: Good ADL Goals Pt Will Perform Lower Body Dressing: with supervision;sit to/from stand Pt Will Transfer to Toilet: with supervision;ambulating Pt Will Perform Toileting - Clothing Manipulation and hygiene: with supervision;sit to/from stand  OT Frequency: Min 1X/week   Barriers to D/C: Decreased caregiver support             AM-PAC OT "6 Clicks" Daily Activity     Outcome Measure Help from another person eating meals?: None Help from another person taking care of personal grooming?: None Help from another person toileting, which includes using toliet, bedpan, or urinal?: A Little Help from another person bathing  (including washing, rinsing, drying)?: A Little Help from another person to put on and taking off regular upper body clothing?: None Help from another person to put on and taking off regular lower body clothing?: A Little 6 Click Score: 21   End of Session Equipment Utilized During Treatment: Rolling walker Nurse Communication: Mobility status  Activity Tolerance: Patient tolerated treatment well Patient left: in bed;with call bell/phone within reach  OT Visit Diagnosis: Unsteadiness on feet (R26.81);Muscle weakness (generalized) (M62.81);Pain Pain - Right/Left: Right Pain - part of body: Hip                Time: 1638-4536 OT Time Calculation (min): 44 min Charges:  OT General Charges $OT Visit: 1 Visit OT Evaluation $OT Eval Low Complexity: 1 Low OT Treatments $Self Care/Home Management : 23-37 mins  Darleen Crocker, MS, OTR/L , CBIS ascom [phone]  01/27/20, 1:04 PM

## 2020-01-27 NOTE — Progress Notes (Addendum)
  Subjective: 1 Day Post-Op Procedure(s) (LRB): TOTAL HIP ARTHROPLASTY (Right) Patient reports pain as well-controlled.   Patient has had significant problems with lightheadedness upon standing. No current complaints of same. Plan is to go Skilled nursing facility after hospital stay. Negative for chest pain and shortness of breath Fever: no Gastrointestinal: negative for nausea and vomiting.  Patient has not had a bowel movement.  Objective: Vital signs in last 24 hours: Temp:  [97 F (36.1 C)-98.4 F (36.9 C)] 97.5 F (36.4 C) (11/18 0759) Pulse Rate:  [62-98] 68 (11/18 0759) Resp:  [15-24] 16 (11/18 0759) BP: (84-110)/(50-82) 105/62 (11/18 0759) SpO2:  [100 %] 100 % (11/18 0759)  Intake/Output from previous day:  Intake/Output Summary (Last 24 hours) at 01/27/2020 0824 Last data filed at 01/27/2020 0400 Gross per 24 hour  Intake 3204.68 ml  Output 960 ml  Net 2244.68 ml    Intake/Output this shift: No intake/output data recorded.  Labs: No results for input(s): HGB in the last 72 hours. No results for input(s): WBC, RBC, HCT, PLT in the last 72 hours. No results for input(s): NA, K, CL, CO2, BUN, CREATININE, GLUCOSE, CALCIUM in the last 72 hours. No results for input(s): LABPT, INR in the last 72 hours.   EXAM General - Patient is Alert, Appropriate and Oriented Extremity - Neurovascular intact Dorsiflexion/Plantar flexion intact Compartment soft Dressing/Incision -Hemovac in place, minimal drainage on honeycomb dressing Motor Function - intact, moving foot and toes well on exam.  Cardiovascular- Regular rate and rhythm, no murmurs/rubs/gallops Respiratory- Lungs clear to auscultation bilaterally Gastrointestinal- soft, nontender and active bowel sounds   Assessment/Plan: 1 Day Post-Op Procedure(s) (LRB): TOTAL HIP ARTHROPLASTY (Right) Active Problems:   Hx of total hip arthroplasty, right  Estimated body mass index is 22.4 kg/m as calculated from the  following:   Height as of this encounter: 5' 5.5" (1.664 m).   Weight as of this encounter: 62 kg. Advance diet Up with therapy  -Lightheadedness, likely due to orthostatic hypotension Lisinopril held at this time. Will continue amlodipine. 500 cc bolus of NS ordered.  DVT Prophylaxis - Lovenox, Ted hose and foot pumps Weight-Bearing as tolerated to right leg  Cassell Smiles, PA-C [facility] 01/27/2020, 8:24 AM

## 2020-01-27 NOTE — Progress Notes (Signed)
Physical Therapy Treatment Patient Details Name: Helen Snow MRN: [id number] DOB: Jun 19, 1950 Today's Date: 01/27/2020    History of Present Illness 69 y/o female s/p R total hip replacement (posterior approach)    PT Comments    Pt was sitting in recliner upon arriving. She agrees to PT session and reports she attempted performing HEP handout independently since last PT session. Reports she has severe cramping and muscle spasms. Was agreeable to ambulate but requested holding on exercises. She was easily able to stand and ambulate 200 ft with RW without LOB or unsteadiness. Pt is progressing but will continue to benefit from skilled PT at DC to address deficits and improve safe functional mobility. She was in recliner with chair alarm in place, BLEs elevated, call bell in reach, and ice applied.      Follow Up Recommendations  Follow surgeon's recommendation for DC plan and follow-up therapies;SNF;Other (comment) (pt request to go to [facility])     Equipment Recommendations  Rolling walker with 5" wheels;3in1 (PT)    Recommendations for Other Services       Precautions / Restrictions Precautions Precautions: Posterior Hip Precaution Booklet Issued: Yes (comment) Precaution Comments: WBAT Restrictions Weight Bearing Restrictions: Yes RLE Weight Bearing: Weight bearing as tolerated    Mobility  Bed Mobility      General bed mobility comments: in recliner pre/post session  Transfers Overall transfer level: Needs assistance Equipment used: Rolling walker (2 wheeled) Transfers: Sit to/from Stand Sit to Stand: Min guard         General transfer comment: CGA for safety only  Ambulation/Gait Ambulation/Gait assistance: Supervision Gait Distance (Feet): 200 Feet Assistive device: Rolling walker (2 wheeled)   Gait velocity: WFL   General Gait Details: pt was able to ambulate 200 ft with RW without difficulty or LOB       Balance Overall balance assessment:  Needs assistance Sitting-balance support: Feet supported Sitting balance-Leahy Scale: Good     Standing balance support: During functional activity;Bilateral upper extremity supported Standing balance-Leahy Scale: Good Standing balance comment: no LOB in standing        Cognition Arousal/Alertness: Awake/alert Behavior During Therapy: WFL for tasks assessed/performed Overall Cognitive Status: Within Functional Limits for tasks assessed    General Comments: Pt is A and O x 4 and cooperative and pleasant throughout. able to recall 3/3 precautions this afternoon         General Comments General comments (skin integrity, edema, etc.): pt reports she had severe cramping/spasms with performing HEP handout prior to session. elected not to perform this session      Pertinent Vitals/Pain Pain Assessment: 0-10 Pain Score: 4  Pain Location: R hip Pain Descriptors / Indicators: Aching;Discomfort Pain Intervention(s): Limited activity within patient's tolerance;Repositioned;Premedicated before session;Monitored during session;Ice applied           PT Goals (current goals can now be found in the care plan section) Acute Rehab PT Goals Patient Stated Goal: return home after rehab Progress towards PT goals: Progressing toward goals    Frequency    BID      PT Plan Current plan remains appropriate       AM-PAC PT "6 Clicks" Mobility   Outcome Measure  Help needed turning from your back to your side while in a flat bed without using bedrails?: A Little Help needed moving from lying on your back to sitting on the side of a flat bed without using bedrails?: A Little Help needed moving to and from  a bed to a chair (including a wheelchair)?: A Little Help needed standing up from a chair using your arms (e.g., wheelchair or bedside chair)?: A Little Help needed to walk in hospital room?: A Little Help needed climbing 3-5 steps with a railing? : A Little 6 Click Score: 18    End  of Session Equipment Utilized During Treatment: Gait belt Activity Tolerance: Patient tolerated treatment well Patient left: in chair;with call bell/phone within reach;with chair alarm set Nurse Communication: Mobility status PT Visit Diagnosis: Muscle weakness (generalized) (M62.81);Difficulty in walking, not elsewhere classified (R26.2);Pain Pain - Right/Left: Right Pain - part of body: Hip     Time: 1325-1345 PT Time Calculation (min) (ACUTE ONLY): 20 min  Charges:  $Gait Training: 8-22 mins                     Julaine Fusi PTA 01/27/20, 5:10 PM

## 2020-01-27 NOTE — TOC Initial Note (Signed)
Transition of Care [facility]) - Initial/Assessment Note    Patient Details  Name: Helen Snow MRN: [id number] Date of Birth: 11/27/1950  [facility]) CM/SW Contact:    Shelbie Ammons, RN Phone Number: 01/27/2020, 8:34 AM  Clinical Narrative:   RNCM met with patient in room. Patient reports to feeling well today, she is sitting up in bed and has put on make-up, waiting on her breakfast. Patient reports that it is her plan to go to skilled nursing at discharge and that she has talked with Seth Bake at [facility]. She reports that would be her first choice and then [facility] would be her second. Patient reports that she is normally independent from home alone and it will be her plan for her to return to her home. Patient is fully vaccinated and requests to be able to have a friend take her to facility.  RNCM completed PASSR, FL-2 and sent bed requests to both [facility] and [facility].                Expected Discharge Plan: Skilled Nursing Facility Barriers to Discharge: Continued Medical Work up   Patient Goals and CMS Choice Patient states their goals for this hospitalization and ongoing recovery are:: I would like to get my rehab done and be able to get home.   Choice offered to / list presented to : Patient  Expected Discharge Plan and Services Expected Discharge Plan: [place] Acute Care Choice: [place] arrangements for the past 2 months: Single Family Home                                      Prior Living Arrangements/Services Living arrangements for the past 2 months: Single Family Home Lives with:: Self Patient language and need for interpreter reviewed:: Yes Do you feel safe going back to the place where you live?: Yes      Need for Family Participation in Patient Care: Yes (Comment) Care giver support system in place?: Yes (comment)   Criminal Activity/Legal Involvement Pertinent to Current  Situation/Hospitalization: No - Comment as needed  Activities of Daily Living Home Assistive Devices/Equipment: Cane (specify quad or straight) ADL Screening (condition at time of admission) Patient's cognitive ability adequate to safely complete daily activities?: Yes Is the patient deaf or have difficulty hearing?: No Does the patient have difficulty seeing, even when wearing glasses/contacts?: No Does the patient have difficulty concentrating, remembering, or making decisions?: No Patient able to express need for assistance with ADLs?: Yes Does the patient have difficulty dressing or bathing?: No Independently performs ADLs?: Yes (appropriate for developmental age) Does the patient have difficulty walking or climbing stairs?: Yes Weakness of Legs: Right Weakness of Arms/Hands: None  Permission Sought/Granted                  Emotional Assessment Appearance:: Appears stated age Attitude/Demeanor/Rapport: Engaged Affect (typically observed): Appropriate, Calm Orientation: : Oriented to Self, Oriented to Place, Oriented to  Time, Oriented to Situation Alcohol / Substance Use: Not Applicable Psych Involvement: No (comment)  Admission diagnosis:  Hx of total hip arthroplasty, right [Z30.865] Patient Active Problem List   Diagnosis Date Noted  . Hx of total hip arthroplasty, right 01/26/2020  . Hypothyroidism 12/02/2019  . Primary osteoarthritis of right hip 06/18/2019  . Lichen planopilaris 78/46/9629  . Esophageal spasm 12/18/2018  .  Stenosis of right carotid artery 12/18/2018  . Cataracts, bilateral 12/18/2018  . Hypertension   . Hyperlipidemia   . Osteopenia of multiple sites 08/08/2016  . Goals of care, counseling/discussion 03/15/2016  . Tonsillar cancer ([place]) 03/06/2016  . Cancer ([place]) 02/26/2016  . Postmenopausal osteoporosis 01/25/2016  . Pure hypercholesterolemia 01/25/2016  . Essential hypertension 07/28/2014  . Anxiety 08/25/2013  . HTN (hypertension)  08/25/2013  . Hyperlipidemia, unspecified 08/25/2013  . Osteoporosis 08/25/2013  . Vitamin D deficiency 08/25/2013   PCP:  McLean-Scocuzza, Nino Glow, MD Pharmacy:   [id number] [place] [place]Phone: [phone]: [address]Phone: [phone]: [phone]    Social Determinants of Health (SDOH) Interventions    Readmission Risk Interventions No flowsheet data found.

## 2020-01-27 NOTE — Progress Notes (Signed)
Physical Therapy Treatment Patient Details Name: Helen Snow MRN: [id number] DOB: 09/27/1950 Today's Date: 01/27/2020    History of Present Illness 69 y/o female s/p R total hip replacement (posterior approach)    PT Comments    Pt was sitting in recliner upon arriving. She agrees to PT session and is cooperative throughout. Was premedicated for pain prior to session. Continues to endorse pain but rates it "tolerable" 7/10 in wt bearing. She was able to recall 1 of 3 hip precautions. Educated on importance of adhering to precautions prior to mobility performed. She was able to adhere throughout remainder of session. Easily able to stand to RW and ambulate 200 ft without LOB or unsteadiness. Progressed to step through gait pattern by the end of gait training. Overall ot is progressing well towards PT goals. Pt does live home alone and feels she will require rehab at DC. Recommend following surgeon's recommendations on DC disposition. Will continue to benefit form skilled PT at DC to improve strength and mobility while assisting pt to PLOF. She was in recliner at conclusion of session with call bell in reach, chair alarm in place, and ice applied to decrease post operative swelling.     Follow Up Recommendations  Follow surgeons recommendation for DC plan and follow-up therapies;SNF;Other (comment) (pt lives alone and feels she will benefit from SNF at DC)     Equipment Recommendations  Rolling walker with 5" wheels;3in1 (PT)    Recommendations for Other Services       Precautions / Restrictions Precautions Precautions: Posterior Hip Precaution Booklet Issued: Yes (comment) Precaution Comments: WBAT Restrictions Weight Bearing Restrictions: Yes RLE Weight Bearing: Weight bearing as tolerated    Mobility  Bed Mobility    General bed mobility comments: in recliner pre/post session  Transfers Overall transfer level: Needs assistance Equipment used: Rolling walker (2  wheeled) Transfers: Sit to/from Stand Sit to Stand: Supervision         General transfer comment: Pt was easily able to STS from recliner while adhereing to hip precautions  Ambulation/Gait Ambulation/Gait assistance: Supervision Gait Distance (Feet): 200 Feet Assistive device: Rolling walker (2 wheeled) Gait Pattern/deviations: Step-through pattern Gait velocity: WFL   General Gait Details: pt was able to ambulate one lap around RN station with supervision. no LOB or fatigue noted. Pt does endorse 7/10 pain        Balance Overall balance assessment: Modified Independent       Cognition Arousal/Alertness: Awake/alert Behavior During Therapy: WFL for tasks assessed/performed Overall Cognitive Status: Within Functional Limits for tasks assessed      General Comments: Pt is A and O x 4 and cooperative and pleasant throughout. able to recall 1/3 hip precautions             Pertinent Vitals/Pain Pain Assessment: 0-10 Pain Score: 7  Pain Location: R hip Pain Intervention(s): Limited activity within patient's tolerance;Monitored during session;Premedicated before session;Repositioned;Ice applied           PT Goals (current goals can now be found in the care plan section) Acute Rehab PT Goals Patient Stated Goal: return to PLOF prior to hip hurting Progress towards PT goals: Progressing toward goals    Frequency    BID      PT Plan Current plan remains appropriate       AM-PAC PT "6 Clicks" Mobility   Outcome Measure  Help needed turning from your back to your side while in a flat bed without using bedrails?: A Little Help  needed moving from lying on your back to sitting on the side of a flat bed without using bedrails?: A Little Help needed moving to and from a bed to a chair (including a wheelchair)?: A Little Help needed standing up from a chair using your arms (e.g., wheelchair or bedside chair)?: A Little Help needed to walk in hospital room?: A  Little Help needed climbing 3-5 steps with a railing? : A Little 6 Click Score: 18    End of Session   Activity Tolerance: Patient tolerated treatment well Patient left: in chair;with call bell/phone within reach;with chair alarm set Nurse Communication: Mobility status PT Visit Diagnosis: Muscle weakness (generalized) (M62.81);Difficulty in walking, not elsewhere classified (R26.2);Pain Pain - Right/Left: Right Pain - part of body: Hip     Time: 1020-1045 PT Time Calculation (min) (ACUTE ONLY): 25 min  Charges:  $Gait Training: 8-22 mins $Therapeutic Activity: 8-22 mins                     Julaine Fusi PTA 01/27/20, 10:58 AM

## 2020-01-27 NOTE — NC FL2 (Signed)
[place] LEVEL OF CARE SCREENING TOOL     IDENTIFICATION  Patient Name: Helen Snow Birthdate: May 02, 1950 Sex: female Admission Date (Current Location): 01/26/2020  [place] and [place] Number:  Engineering geologist and Address:  [facility], [address], [place], [place] [address]      Provider Number: [id number]  Attending Physician Name and Address:  Dereck Leep, MD  Relative Name and Phone Number:  Grier Mitts [phone]    Current Level of Care: Hospital Recommended Level of Care: [place] Prior Approval Number:    Date Approved/Denied:   PASRR Number: [id number]  Discharge Plan: SNF    Current Diagnoses: Patient Active Problem List   Diagnosis Date Noted  . Hx of total hip arthroplasty, right 01/26/2020  . Hypothyroidism 12/02/2019  . Primary osteoarthritis of right hip 06/18/2019  . Lichen planopilaris 63/84/5364  . Esophageal spasm 12/18/2018  . Stenosis of right carotid artery 12/18/2018  . Cataracts, bilateral 12/18/2018  . Hypertension   . Hyperlipidemia   . Osteopenia of multiple sites 08/08/2016  . Goals of care, counseling/discussion 03/15/2016  . Tonsillar cancer ([place]) 03/06/2016  . Cancer ([place]) 02/26/2016  . Postmenopausal osteoporosis 01/25/2016  . Pure hypercholesterolemia 01/25/2016  . Essential hypertension 07/28/2014  . Anxiety 08/25/2013  . HTN (hypertension) 08/25/2013  . Hyperlipidemia, unspecified 08/25/2013  . Osteoporosis 08/25/2013  . Vitamin D deficiency 08/25/2013    Orientation RESPIRATION BLADDER Height & Weight     Self, Time, Situation, Place  Normal Continent Weight: 62 kg Height:  5' 5.5" (166.4 cm)  BEHAVIORAL SYMPTOMS/MOOD NEUROLOGICAL BOWEL NUTRITION STATUS      Continent Diet (Regular)  AMBULATORY STATUS COMMUNICATION OF NEEDS Skin   Extensive Assist Verbally Surgical wounds                       Personal Care Assistance Level of  Assistance  Bathing, Feeding, Dressing Bathing Assistance: Limited assistance Feeding assistance: Independent Dressing Assistance: Limited assistance     Functional Limitations Info  Sight, Hearing, Speech Sight Info: Adequate Hearing Info: Adequate Speech Info: Adequate    SPECIAL CARE FACTORS FREQUENCY  PT (By licensed PT), OT (By licensed OT)                    Contractures Contractures Info: Not present    Additional Factors Info  Code Status, Allergies Code Status Info: Full Allergies Info: Pioglitazone           Current Medications (01/27/2020):  This is the current hospital active medication list Current Facility-Administered Medications  Medication Dose Route Frequency Provider Last Rate Last Admin  . 0.9 %  sodium chloride infusion   Intravenous Continuous Hooten, Laurice Record, MD 100 mL/hr at 01/26/20 1719 New Bag at 01/26/20 1719  . acetaminophen (OFIRMEV) IV 1,000 mg  1,000 mg Intravenous Q6H Oswald Hillock, RPH 400 mL/hr at 01/27/20 0555 1,000 mg at 01/27/20 0555  . acetaminophen (TYLENOL) tablet 325-650 mg  325-650 mg Oral Q6H PRN Hooten, Laurice Record, MD      . ALPRAZolam Duanne Moron) tablet 0.5 mg  0.5 mg Oral Daily PRN Hooten, Laurice Record, MD      . alum & mag hydroxide-simeth (MAALOX/MYLANTA) 200-200-20 MG/5ML suspension 30 mL  30 mL Oral Q4H PRN Hooten, Laurice Record, MD      . amLODipine (NORVASC) tablet 5 mg  5 mg Oral Daily Hooten, Laurice Record, MD      .  ascorbic acid (VITAMIN C) tablet 500 mg  500 mg Oral BID Dereck Leep, MD   500 mg at 01/26/20 2055  . bisacodyl (DULCOLAX) suppository 10 mg  10 mg Rectal Daily PRN Hooten, Laurice Record, MD      . calcium citrate-vitamin D 500-500 MG-UNIT per chewable tablet 1 tablet  1 tablet Oral Daily Hooten, Laurice Record, MD      . celecoxib (CELEBREX) capsule 200 mg  200 mg Oral BID Hooten, Laurice Record, MD      . clobetasol cream (TEMOVATE) 2.67 % 1 application  1 application Topical Daily PRN Hooten, Laurice Record, MD      . diphenhydrAMINE (BENADRYL)  12.5 MG/5ML elixir 12.5-25 mg  12.5-25 mg Oral Q4H PRN Hooten, Laurice Record, MD      . diphenhydrAMINE (BENADRYL) capsule 25 mg  25 mg Oral QHS PRN Hooten, Laurice Record, MD      . enoxaparin (LOVENOX) injection 30 mg  30 mg Subcutaneous Q12H Hooten, Laurice Record, MD      . ferrous sulfate tablet 325 mg  325 mg Oral BID WC Hooten, Laurice Record, MD   325 mg at 01/26/20 1619  . finasteride (PROSCAR) tablet 5 mg  5 mg Oral Daily Hooten, Laurice Record, MD      . gabapentin (NEURONTIN) capsule 300 mg  300 mg Oral QHS Hooten, Laurice Record, MD      . HYDROmorphone (DILAUDID) injection 0.5-1 mg  0.5-1 mg Intravenous Q4H PRN Hooten, Laurice Record, MD      . ipratropium (ATROVENT) 0.06 % nasal spray 2 spray  2 spray Each Nare q AM Hooten, Laurice Record, MD      . levothyroxine (SYNTHROID) tablet 50 mcg  50 mcg Oral Q0600 Dereck Leep, MD   50 mcg at 01/27/20 0554  . magnesium hydroxide (MILK OF MAGNESIA) suspension 30 mL  30 mL Oral Daily Hooten, Laurice Record, MD      . menthol-cetylpyridinium (CEPACOL) lozenge 3 mg  1 lozenge Oral PRN Hooten, Laurice Record, MD       Or  . phenol (CHLORASEPTIC) mouth spray 1 spray  1 spray Mouth/Throat PRN Hooten, Laurice Record, MD      . metoCLOPramide (REGLAN) tablet 10 mg  10 mg Oral TID AC & HS Hooten, Laurice Record, MD   10 mg at 01/26/20 1619  . multivitamin with minerals tablet 1 tablet  1 tablet Oral Daily Hooten, Laurice Record, MD      . ondansetron (ZOFRAN) tablet 4 mg  4 mg Oral Q6H PRN Hooten, Laurice Record, MD       Or  . ondansetron (ZOFRAN) injection 4 mg  4 mg Intravenous Q6H PRN Hooten, Laurice Record, MD      . oxyCODONE (Oxy IR/ROXICODONE) immediate release tablet 5-10 mg  5-10 mg Oral Q4H PRN Hooten, Laurice Record, MD      . pantoprazole (PROTONIX) EC tablet 40 mg  40 mg Oral BID Dereck Leep, MD   40 mg at 01/26/20 2056  . pravastatin (PRAVACHOL) tablet 40 mg  40 mg Oral QHS Hooten, Laurice Record, MD   40 mg at 01/26/20 2056  . senna-docusate (Senokot-S) tablet 1 tablet  1 tablet Oral BID Dereck Leep, MD   1 tablet at 01/26/20 2056  .  sodium chloride 0.9 % bolus 500 mL  500 mL Intravenous Once Hooten, Laurice Record, MD      . sodium phosphate (FLEET) 7-19 GM/118ML enema 1 enema  1 enema Rectal  Once PRN Hooten, Laurice Record, MD      . traMADol Veatrice Bourbon) tablet 50-100 mg  50-100 mg Oral Q4H PRN Hooten, Laurice Record, MD         Discharge Medications: Please see discharge summary for a list of discharge medications.  Relevant Imaging Results:  Relevant Lab Results:   Additional Information SS# [id number]  Shelbie Ammons, RN

## 2020-01-27 NOTE — Anesthesia Postprocedure Evaluation (Signed)
Anesthesia Post Note  Patient: Helen Snow  Procedure(s) Performed: TOTAL HIP ARTHROPLASTY (Right Hip)  Patient location during evaluation: Nursing Unit Anesthesia Type: Spinal Level of consciousness: oriented and awake and alert Pain management: pain level controlled Vital Signs Assessment: post-procedure vital signs reviewed and stable Respiratory status: spontaneous breathing and respiratory function stable Cardiovascular status: blood pressure returned to baseline and stable Postop Assessment: no headache, no backache, no apparent nausea or vomiting and patient able to bend at knees Anesthetic complications: no   No complications documented.   Last Vitals:  Vitals:   01/26/20 2324 01/27/20 0318  BP: (!) 98/50 (!) 94/50  Pulse: 65 67  Resp: 16 15  Temp: 36.4 C 36.9 C  SpO2: 100% 100%    Last Pain:  Vitals:   01/27/20 0400  TempSrc:   PainSc: Asleep                 Caryl Asp

## 2020-01-27 NOTE — Discharge Summary (Addendum)
Physician Discharge Summary  Patient ID: Helen Snow MRN: [id number] DOB/AGE: 06-21-1950 69 y.o.  Admit date: 01/26/2020 Discharge date: 01/28/2020  Admission Diagnoses:  Hx of total hip arthroplasty, right [Z96.641]  Surgeries:Procedure(s): Right total hip arthroplasty  SURGEON:  Marciano Sequin. M.D.  ASSISTANT: Cassell Smiles, PA-C (present and scrubbed throughout the case, critical for assistance with exposure, retraction, instrumentation, and closure)  ANESTHESIA: spinal  ESTIMATED BLOOD LOSS: 100 mL  FLUIDS REPLACED: 1000 mL of crystalloid  DRAINS: 2 medium Hemovac  IMPLANTS UTILIZED: DePuy 13.5 mm small stature AML femoral stem, 56 mm OD [employer] acetabular component, 2 - 6.5 mm cancellous screws, neutral [employer] polyethylene insert, and a 36 mm CoCr M-SPEC -2 mm hip ball  Discharge Diagnoses: Patient Active Problem List   Diagnosis Date Noted  . Hx of total hip arthroplasty, right 01/26/2020  . Hypothyroidism 12/02/2019  . Primary osteoarthritis of right hip 06/18/2019  . Lichen planopilaris 76/73/4193  . Esophageal spasm 12/18/2018  . Stenosis of right carotid artery 12/18/2018  . Cataracts, bilateral 12/18/2018  . Hypertension   . Hyperlipidemia   . Osteopenia of multiple sites 08/08/2016  . Goals of care, counseling/discussion 03/15/2016  . Tonsillar cancer ([place]) 03/06/2016  . Cancer ([place]) 02/26/2016  . Postmenopausal osteoporosis 01/25/2016  . Pure hypercholesterolemia 01/25/2016  . Essential hypertension 07/28/2014  . Anxiety 08/25/2013  . HTN (hypertension) 08/25/2013  . Hyperlipidemia, unspecified 08/25/2013  . Osteoporosis 08/25/2013  . Vitamin D deficiency 08/25/2013    Past Medical History:  Diagnosis Date  . Anxiety   . Arthritis   . Asthma    as child  . 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 Tami Ribas   . Hyperlipidemia   . Hypertension   .  Hypothyroidism   . Osteoporosis   . Personal history of chemotherapy   . Personal history of radiation therapy   . Postmenopausal      Transfusion:    Consultants (if any):   Discharged Condition: Improved  Hospital Course: Eriyonna Matsushita is an 69 y.o. female who was admitted 01/26/2020 with a diagnosis of right hip osteoarthritis and went to the operating room on 01/26/2020 and underwent right total hip arthroplasty through posterior approach. The patient received perioperative antibiotics for prophylaxis (see below). The patient tolerated the procedure well and was transported to PACU in stable condition. After meeting PACU criteria, the patient was subsequently transferred to the Orthopaedics/Rehabilitation unit.   The patient received DVT prophylaxis in the form of early mobilization, Lovenox, Foot Pumps and TED hose. A sacral pad had been placed and heels were elevated off of the bed with rolled towels in order to protect skin integrity. Foley catheter was discontinued on postoperative day #0. Wound drains were discontinued on postoperative day #2. The surgical incision was healing well without signs of infection.  Physical therapy was initiated postoperatively for transfers, gait training, and strengthening. Occupational therapy was initiated for activities of daily living and evaluation for assisted devices. Rehabilitation goals were reviewed in detail with the patient. The patient made steady progress with physical therapy and physical therapy recommended discharge to Home.   The patient achieved the preliminary goals of this hospitalization and was felt to be medically and orthopaedically appropriate for discharge.  She was given perioperative antibiotics:  Anti-infectives (From admission, onward)   Start     Dose/Rate Route Frequency Ordered Stop   01/26/20 1445  ceFAZolin (ANCEF) IVPB 2g/100  mL premix        2 g 200 mL/hr over 30 Minutes Intravenous Every 6 hours 01/26/20 1358  01/26/20 2124   01/26/20 0656  ceFAZolin (ANCEF) 2-4 GM/100ML-% IVPB       Note to Pharmacy: Myles Lipps   : cabinet override      01/26/20 0656 01/26/20 1859   01/26/20 0600  ceFAZolin (ANCEF) IVPB 2g/100 mL premix  Status:  Discontinued        2 g 200 mL/hr over 30 Minutes Intravenous On call to O.R. 01/25/20 2238 01/26/20 0932    .  Recent vital signs:  Vitals:   01/28/20 0425 01/28/20 0741  BP: (!) 114/56 (!) 117/51  Pulse: 77 75  Resp: 16 17  Temp: 98.1 F (36.7 C) 97.9 F (36.6 C)  SpO2: 99% 100%    Recent laboratory studies:  No results for input(s): WBC, HGB, HCT, PLT, K, CL, CO2, BUN, CREATININE, GLUCOSE, CALCIUM, LABPT, INR in the last 72 hours.  Diagnostic Studies: DG Hip Port Unilat With Pelvis 1V Right  Result Date: 01/26/2020 CLINICAL DATA:  Postop total hip arthroplasty. EXAM: DG HIP (WITH OR WITHOUT PELVIS) 1V PORT RIGHT COMPARISON:  Radiographs 06/09/2019. FINDINGS: AP pelvis and cross-table lateral view of the right hip. The upper pelvis is excluded. Patient is status post right total hip arthroplasty with a screw fixed acetabular component. The hardware is well positioned. There is no evidence of acute fracture or dislocation. A small amount of gas is present within the soft tissue surrounding the hip. Surgical drain and skin staples are in place. IMPRESSION: No demonstrated complication following right total hip arthroplasty. Electronically Signed   By: Richardean Sale M.D.   On: 01/26/2020 14:49    Discharge Medications:   Allergies as of 01/28/2020      Reactions   Pioglitazone Swelling   Fluid retention      Medication List    STOP taking these medications   Ibuprofen 200 MG Caps   lisinopril 40 MG tablet Commonly known as: ZESTRIL   Turmeric 500 MG Tabs     TAKE these medications   ALPRAZolam 0.5 MG tablet Commonly known as: XANAX Take 1 tablet (0.5 mg total) by mouth daily as needed for anxiety.   amLODipine 5 MG tablet Commonly  known as: NORVASC Take 1 tablet (5 mg total) by mouth daily.   ascorbic acid 500 MG tablet Commonly known as: VITAMIN C Take 500 mg by mouth 2 (two) times daily.   CALCIUM 1200 PO Take 1 tablet by mouth daily.   celecoxib 200 MG capsule Commonly known as: CELEBREX Take 1 capsule (200 mg total) by mouth 2 (two) times daily.   clobetasol 0.05 % external solution Commonly known as: TEMOVATE Apply 1 application topically daily as needed (itching).   Co Q-10 100 MG Caps Take 100 mg by mouth daily.   diphenhydrAMINE 25 mg capsule Commonly known as: BENADRYL Take 25 mg by mouth at bedtime as needed for sleep.   enoxaparin 40 MG/0.4ML injection Commonly known as: LOVENOX Inject 0.4 mLs (40 mg total) into the skin daily for 14 days.   finasteride 5 MG tablet Commonly known as: PROSCAR Take 5 mg by mouth daily.   ipratropium 0.06 % nasal spray Commonly known as: ATROVENT Place 2 sprays into both nostrils in the morning.   levothyroxine 50 MCG tablet Commonly known as: SYNTHROID Take 1 tablet (50 mcg total) by mouth daily before breakfast. 30 minutes before food. Do not  take with vitamins wait 4 hours after medication before vitamins   multivitamin with minerals Tabs tablet Take 1 tablet by mouth daily.   NALTREXONE HCL PO Take 3 mg by mouth daily.   oxyCODONE 5 MG immediate release tablet Commonly known as: Oxy IR/ROXICODONE Take 1 tablet (5 mg total) by mouth every 4 (four) hours as needed for moderate pain (pain score 4-6).   pravastatin 40 MG tablet Commonly known as: PRAVACHOL Take 1 tablet (40 mg total) by mouth daily. What changed: when to take this   traMADol 50 MG tablet Commonly known as: ULTRAM Take 1 tablet (50 mg total) by mouth every 4 (four) hours as needed for moderate pain.   Vitamin K2 100 MCG Tabs Take 100 mcg by mouth daily.            Durable Medical Equipment  (From admission, onward)         Start     Ordered   01/26/20 1359  DME  Walker rolling  Once       Question:  Patient needs a walker to treat with the following condition  Answer:  S/P total hip arthroplasty   01/26/20 1358   01/26/20 1359  DME Bedside commode  Once       Question:  Patient needs a bedside commode to treat with the following condition  Answer:  S/P total hip arthroplasty   01/26/20 1358          Disposition: rehab facility [facility])     Contact information for follow-up providers    Dereck Leep, MD On 03/09/2020.   Specialty: Orthopedic Surgery Why: at 2:00pm Contact information: [address] [facility] [place] [address] [phone]            Contact information for after-discharge care    Destination    [facility] .   Service: Skilled Nursing Contact information: [place], PA-C 01/28/2020, 9:03 AM

## 2020-01-27 NOTE — TOC Progression Note (Signed)
Transition of Care [facility]) - Progression Note    Patient Details  Name: Helen Snow MRN: [id number] Date of Birth: [birth year]  [facility] CM/SW Contact  Shelbie Ammons, RN Phone Number: 01/27/2020, 2:43 PM  Clinical Narrative:   RNCM met with patient in room. Patient sitting up in recliner, reports to some increased discomfort this afternoon after PT but that she realizes it is a necessity. Discussed with patient that [facility] is able to offer a bed and patient verbalizes that she would definitely like to accept. RNCM reached out to Tammy with [employer] and initiated insurance authorization.     Expected Discharge Plan: [place] Barriers to Discharge: Continued Medical Work up  Expected Discharge Plan and Services Expected Discharge Plan: [place]: [place] arrangements for the past 2 months: Single Family Home                                       Social Determinants of Health (SDOH) Interventions    Readmission Risk Interventions No flowsheet data found.

## 2020-01-27 NOTE — Care Management Important Message (Signed)
Important Message  Patient Details  Name: Helen Snow MRN: [id number] Date of Birth: [birth year]   Medicare Important Message Given:  N/A - LOS <3 / Initial given by admissions  Initial Medicare IM reviewed with patient by Meredith Mody, Patient Access Associate on 01/27/2020 at 11:02am.    Dannette Barbara 01/27/2020, 3:04 PM

## 2020-01-28 DIAGNOSIS — M1611 Unilateral primary osteoarthritis, right hip: Secondary | ICD-10-CM | POA: Diagnosis not present

## 2020-01-28 DIAGNOSIS — F329 Major depressive disorder, single episode, unspecified: Secondary | ICD-10-CM | POA: Diagnosis not present

## 2020-01-28 DIAGNOSIS — R278 Other lack of coordination: Secondary | ICD-10-CM | POA: Diagnosis not present

## 2020-01-28 DIAGNOSIS — Z471 Aftercare following joint replacement surgery: Secondary | ICD-10-CM | POA: Diagnosis not present

## 2020-01-28 DIAGNOSIS — M810 Age-related osteoporosis without current pathological fracture: Secondary | ICD-10-CM | POA: Diagnosis not present

## 2020-01-28 DIAGNOSIS — R2689 Other abnormalities of gait and mobility: Secondary | ICD-10-CM | POA: Diagnosis not present

## 2020-01-28 DIAGNOSIS — I10 Essential (primary) hypertension: Secondary | ICD-10-CM | POA: Diagnosis not present

## 2020-01-28 DIAGNOSIS — M6281 Muscle weakness (generalized): Secondary | ICD-10-CM | POA: Diagnosis not present

## 2020-01-28 DIAGNOSIS — Z741 Need for assistance with personal care: Secondary | ICD-10-CM | POA: Diagnosis not present

## 2020-01-28 DIAGNOSIS — R059 Cough, unspecified: Secondary | ICD-10-CM | POA: Diagnosis not present

## 2020-01-28 DIAGNOSIS — J440 Chronic obstructive pulmonary disease with acute lower respiratory infection: Secondary | ICD-10-CM | POA: Diagnosis not present

## 2020-01-28 DIAGNOSIS — E559 Vitamin D deficiency, unspecified: Secondary | ICD-10-CM | POA: Diagnosis not present

## 2020-01-28 DIAGNOSIS — Z96641 Presence of right artificial hip joint: Secondary | ICD-10-CM | POA: Diagnosis not present

## 2020-01-28 DIAGNOSIS — E785 Hyperlipidemia, unspecified: Secondary | ICD-10-CM | POA: Diagnosis not present

## 2020-01-28 DIAGNOSIS — E7800 Pure hypercholesterolemia, unspecified: Secondary | ICD-10-CM | POA: Diagnosis not present

## 2020-01-28 DIAGNOSIS — F419 Anxiety disorder, unspecified: Secondary | ICD-10-CM | POA: Diagnosis not present

## 2020-01-28 DIAGNOSIS — E039 Hypothyroidism, unspecified: Secondary | ICD-10-CM | POA: Diagnosis not present

## 2020-01-28 LAB — SURGICAL PATHOLOGY

## 2020-01-28 MED ORDER — CELECOXIB 200 MG PO CAPS: 200.0000 mg | ORAL_CAPSULE | Freq: Two times a day (BID) | ORAL | 0 refills | Status: DC

## 2020-01-28 MED ORDER — ENOXAPARIN SODIUM 40 MG/0.4ML ~~LOC~~ SOLN: 40.0000 mg | SUBCUTANEOUS | 0 refills | Status: DC

## 2020-01-28 MED ORDER — OXYCODONE HCL 5 MG PO TABS: 5.0000 mg | ORAL_TABLET | ORAL | 0 refills | Status: DC | PRN

## 2020-01-28 MED ORDER — TRAMADOL HCL 50 MG PO TABS: 50.0000 mg | ORAL_TABLET | ORAL | 0 refills | Status: DC | PRN

## 2020-01-28 NOTE — Progress Notes (Signed)
  Subjective: 2 Days Post-Op Procedure(s) (LRB): TOTAL HIP ARTHROPLASTY (Right) Patient reports pain as well-controlled.   Patient is well, and has had no acute complaints or problems. No additional episodes of dizziness reported. Plan is to go Rehab after hospital stay. Negative for chest pain and shortness of breath Fever: no Gastrointestinal: negative for nausea and vomiting.  Patient has not had a bowel movement.  Objective: Vital signs in last 24 hours: Temp:  [97.8 F (36.6 C)-98.1 F (36.7 C)] 97.9 F (36.6 C) (11/19 0741) Pulse Rate:  [67-84] 75 (11/19 0741) Resp:  [16-17] 17 (11/19 0741) BP: (108-118)/(45-56) 117/51 (11/19 0741) SpO2:  [99 %-100 %] 100 % (11/19 0741)  Intake/Output from previous day:  Intake/Output Summary (Last 24 hours) at 01/28/2020 0900 Last data filed at 01/28/2020 0241 Gross per 24 hour  Intake 480 ml  Output 90 ml  Net 390 ml    Intake/Output this shift: No intake/output data recorded.  Labs: No results for input(s): HGB in the last 72 hours. No results for input(s): WBC, RBC, HCT, PLT in the last 72 hours. No results for input(s): NA, K, CL, CO2, BUN, CREATININE, GLUCOSE, CALCIUM in the last 72 hours. No results for input(s): LABPT, INR in the last 72 hours.   EXAM General - Patient is Alert, Appropriate and Oriented Extremity - Neurovascular intact Dorsiflexion/Plantar flexion intact Compartment soft Dressing/Incision -no significant drainage from incision; hemovac in place Motor Function - intact, moving foot and toes well on exam.  Cardiovascular- Regular rate and rhythm, no murmurs/rubs/gallops Respiratory- Lungs clear to auscultation bilaterally Gastrointestinal- soft, nontender and active bowel sounds   Assessment/Plan: 2 Days Post-Op Procedure(s) (LRB): TOTAL HIP ARTHROPLASTY (Right) Active Problems:   Hx of total hip arthroplasty, right  Estimated body mass index is 22.4 kg/m as calculated from the following:    Height as of this encounter: 5' 5.5" (1.664 m).   Weight as of this encounter: 62 kg. Advance diet Up with therapy Discharge to SNF  Hemovac removed. Mini compression dressing placed. Honeycomb dressing replaced. Narcotic rx placed in patient chart.   DVT Prophylaxis - Lovenox, Ted hose and foot pumps Weight-Bearing as tolerated to right leg  Cassell Smiles, PA-C [facility] Orthopaedic Surgery 01/28/2020, 9:00 AM

## 2020-01-28 NOTE — Progress Notes (Signed)
Called report to [place] at [facility] rehab. Answered all questions. Awaiting for personal transportation to arrive. AVS and Rxs given to patient.

## 2020-01-28 NOTE — Progress Notes (Signed)
Physical Therapy Treatment Patient Details Name: Helen Snow MRN: [id number] DOB: 01-18-1951 Today's Date: 01/28/2020    History of Present Illness 69 y/o female s/p R total hip replacement (posterior approach)    PT Comments    Pt was long sitting in bed upon arriving. Agrees to PT session and is cooperative throughout. Required  Increased time to exit bed with vcs for proper technique. Was able to adhere to precautions throughout. Stood and ambulated with RW 200 ft without LOB or unsteadiness.She returned to room post gait training and was repositioned in recliner with chair alarm in place, call bell in reach and BLEs elevated. Pt is progressing well.    Follow Up Recommendations  Follow surgeon's recommendation for DC plan and follow-up therapies;SNF;Other (comment)     Equipment Recommendations  Rolling walker with 5" wheels;3in1 (PT)    Recommendations for Other Services       Precautions / Restrictions Precautions Precautions: Posterior Hip Precaution Booklet Issued: Yes (comment) Precaution Comments: WBAT Restrictions Weight Bearing Restrictions: Yes RLE Weight Bearing: Weight bearing as tolerated    Mobility  Bed Mobility Overal bed mobility: Needs Assistance Bed Mobility: Supine to Sit     Supine to sit: Supervision        Transfers Overall transfer level: Needs assistance Equipment used: Rolling walker (2 wheeled) Transfers: Sit to/from Stand Sit to Stand: Min guard         General transfer comment: CGA for safety only  Ambulation/Gait Ambulation/Gait assistance: Supervision Gait Distance (Feet): 200 Feet Assistive device: Rolling walker (2 wheeled) Gait Pattern/deviations: Step-through pattern;Antalgic;Trunk flexed Gait velocity: WFL   General Gait Details: pt was able to ambulate 200 ft with RW without difficulty or LOB       Balance Overall balance assessment: Needs assistance Sitting-balance support: Feet supported Sitting  balance-Leahy Scale: Good     Standing balance support: During functional activity;Bilateral upper extremity supported Standing balance-Leahy Scale: Good Standing balance comment: no LOB in standing  with BUE support       Cognition Arousal/Alertness: Awake/alert Behavior During Therapy: WFL for tasks assessed/performed Overall Cognitive Status: Within Functional Limits for tasks assessed      General Comments: Pt is A and O x 4 and cooperative and pleasant throughout. able to recall 3/3 precautions this afternoon         General Comments General comments (skin integrity, edema, etc.): reviewed HEP however pt hesitant ro perform 2/2 to caused spasms prevfious date      Pertinent Vitals/Pain Pain Assessment: No/denies pain Pain Score: 0-No pain Pain Location: R hip Pain Descriptors / Indicators: Aching;Discomfort Pain Intervention(s): Limited activity within patient's tolerance;Monitored during session;Premedicated before session;Repositioned           PT Goals (current goals can now be found in the care plan section) Acute Rehab PT Goals Patient Stated Goal: return home after rehab Progress towards PT goals: Progressing toward goals    Frequency    BID      PT Plan Current plan remains appropriate    Co-evaluation              AM-PAC PT "6 Clicks" Mobility   Outcome Measure  Help needed turning from your back to your side while in a flat bed without using bedrails?: A Little Help needed moving from lying on your back to sitting on the side of a flat bed without using bedrails?: A Little Help needed moving to and from a bed to a chair (including a wheelchair)?:  A Little Help needed standing up from a chair using your arms (e.g., wheelchair or bedside chair)?: A Little Help needed to walk in hospital room?: A Little Help needed climbing 3-5 steps with a railing? : A Little 6 Click Score: 18    End of Session   Activity Tolerance: Patient tolerated  treatment well Patient left: in chair;with call bell/phone within reach;with chair alarm set Nurse Communication: Mobility status PT Visit Diagnosis: Muscle weakness (generalized) (M62.81);Difficulty in walking, not elsewhere classified (R26.2);Pain Pain - Right/Left: Right Pain - part of body: Hip     Time: 6681-5947 PT Time Calculation (min) (ACUTE ONLY): 23 min  Charges:  $Gait Training: 8-22 mins $Therapeutic Activity: 8-22 mins                     Julaine Fusi PTA 01/28/20, 10:53 AM

## 2020-01-28 NOTE — TOC Progression Note (Signed)
Transition of Care [facility]) - Progression Note    Patient Details  Name: Helen Snow MRN: [id number] Date of Birth: Mar 25, 1950  Transition of Care [facility]) CM/SW Contact  Shelbie Ammons, RN Phone Number: 01/28/2020, 9:04 AM  Clinical Narrative:  RNCM received insurance authorization from [place] with HTA, patient has been approved for a total of 7 days initially, authorization number [id number]    Expected Discharge Plan: [place] Barriers to Discharge: Continued Medical Work up  Expected Discharge Plan and Services Expected Discharge Plan: [place] Choice: [place] arrangements for the past 2 months: Single Family Home                                       Social Determinants of Health (SDOH) Interventions    Readmission Risk Interventions No flowsheet data found.

## 2020-01-31 DIAGNOSIS — M1611 Unilateral primary osteoarthritis, right hip: Secondary | ICD-10-CM

## 2020-01-31 DIAGNOSIS — E7800 Pure hypercholesterolemia, unspecified: Secondary | ICD-10-CM

## 2020-01-31 DIAGNOSIS — E039 Hypothyroidism, unspecified: Secondary | ICD-10-CM

## 2020-01-31 DIAGNOSIS — I10 Essential (primary) hypertension: Secondary | ICD-10-CM

## 2020-02-07 DIAGNOSIS — R059 Cough, unspecified: Secondary | ICD-10-CM

## 2020-02-15 DIAGNOSIS — J440 Chronic obstructive pulmonary disease with acute lower respiratory infection: Secondary | ICD-10-CM

## 2020-02-16 DIAGNOSIS — M810 Age-related osteoporosis without current pathological fracture: Secondary | ICD-10-CM | POA: Diagnosis not present

## 2020-02-23 DIAGNOSIS — I10 Essential (primary) hypertension: Secondary | ICD-10-CM | POA: Diagnosis not present

## 2020-02-23 DIAGNOSIS — E7800 Pure hypercholesterolemia, unspecified: Secondary | ICD-10-CM | POA: Diagnosis not present

## 2020-02-23 DIAGNOSIS — E039 Hypothyroidism, unspecified: Secondary | ICD-10-CM | POA: Diagnosis not present

## 2020-02-23 DIAGNOSIS — I6521 Occlusion and stenosis of right carotid artery: Secondary | ICD-10-CM | POA: Diagnosis not present

## 2020-02-23 DIAGNOSIS — Z9181 History of falling: Secondary | ICD-10-CM | POA: Diagnosis not present

## 2020-02-23 DIAGNOSIS — J45909 Unspecified asthma, uncomplicated: Secondary | ICD-10-CM | POA: Diagnosis not present

## 2020-02-23 DIAGNOSIS — F32A Depression, unspecified: Secondary | ICD-10-CM | POA: Diagnosis not present

## 2020-02-23 DIAGNOSIS — Z96641 Presence of right artificial hip joint: Secondary | ICD-10-CM | POA: Diagnosis not present

## 2020-02-23 DIAGNOSIS — F419 Anxiety disorder, unspecified: Secondary | ICD-10-CM | POA: Diagnosis not present

## 2020-02-23 DIAGNOSIS — Z471 Aftercare following joint replacement surgery: Secondary | ICD-10-CM | POA: Diagnosis not present

## 2020-02-23 DIAGNOSIS — M810 Age-related osteoporosis without current pathological fracture: Secondary | ICD-10-CM | POA: Diagnosis not present

## 2020-03-01 DIAGNOSIS — Z471 Aftercare following joint replacement surgery: Secondary | ICD-10-CM | POA: Diagnosis not present

## 2020-03-02 ENCOUNTER — Ambulatory Visit: Payer: PPO | Admitting: Internal Medicine

## 2020-03-06 ENCOUNTER — Encounter: Payer: Self-pay | Admitting: Internal Medicine

## 2020-03-08 DIAGNOSIS — F32A Depression, unspecified: Secondary | ICD-10-CM | POA: Diagnosis not present

## 2020-03-08 DIAGNOSIS — E7800 Pure hypercholesterolemia, unspecified: Secondary | ICD-10-CM | POA: Diagnosis not present

## 2020-03-08 DIAGNOSIS — Z96641 Presence of right artificial hip joint: Secondary | ICD-10-CM | POA: Diagnosis not present

## 2020-03-08 DIAGNOSIS — M810 Age-related osteoporosis without current pathological fracture: Secondary | ICD-10-CM | POA: Diagnosis not present

## 2020-03-08 DIAGNOSIS — J45909 Unspecified asthma, uncomplicated: Secondary | ICD-10-CM | POA: Diagnosis not present

## 2020-03-08 DIAGNOSIS — I6521 Occlusion and stenosis of right carotid artery: Secondary | ICD-10-CM | POA: Diagnosis not present

## 2020-03-08 DIAGNOSIS — Z471 Aftercare following joint replacement surgery: Secondary | ICD-10-CM | POA: Diagnosis not present

## 2020-03-08 DIAGNOSIS — F419 Anxiety disorder, unspecified: Secondary | ICD-10-CM | POA: Diagnosis not present

## 2020-03-08 DIAGNOSIS — E039 Hypothyroidism, unspecified: Secondary | ICD-10-CM | POA: Diagnosis not present

## 2020-03-08 DIAGNOSIS — I10 Essential (primary) hypertension: Secondary | ICD-10-CM | POA: Diagnosis not present

## 2020-03-08 DIAGNOSIS — Z9181 History of falling: Secondary | ICD-10-CM | POA: Diagnosis not present

## 2020-03-09 DIAGNOSIS — Z96641 Presence of right artificial hip joint: Secondary | ICD-10-CM | POA: Diagnosis not present

## 2020-04-06 DIAGNOSIS — L661 Lichen planopilaris: Secondary | ICD-10-CM | POA: Diagnosis not present

## 2020-04-27 DIAGNOSIS — R682 Dry mouth, unspecified: Secondary | ICD-10-CM | POA: Diagnosis not present

## 2020-04-27 DIAGNOSIS — Z85818 Personal history of malignant neoplasm of other sites of lip, oral cavity, and pharynx: Secondary | ICD-10-CM | POA: Diagnosis not present

## 2020-07-06 ENCOUNTER — Ambulatory Visit: Payer: PPO | Admitting: Internal Medicine

## 2020-07-13 ENCOUNTER — Ambulatory Visit (INDEPENDENT_AMBULATORY_CARE_PROVIDER_SITE_OTHER): Payer: PPO | Admitting: Internal Medicine

## 2020-07-13 ENCOUNTER — Other Ambulatory Visit: Payer: Self-pay

## 2020-07-13 ENCOUNTER — Encounter: Payer: Self-pay | Admitting: Internal Medicine

## 2020-07-13 ENCOUNTER — Telehealth: Payer: Self-pay | Admitting: Internal Medicine

## 2020-07-13 VITALS — BP 134/76 | HR 66 | Temp 97.9°F | Ht 65.5 in | Wt 141.0 lb

## 2020-07-13 DIAGNOSIS — Z96641 Presence of right artificial hip joint: Secondary | ICD-10-CM | POA: Diagnosis not present

## 2020-07-13 DIAGNOSIS — F419 Anxiety disorder, unspecified: Secondary | ICD-10-CM | POA: Diagnosis not present

## 2020-07-13 DIAGNOSIS — M810 Age-related osteoporosis without current pathological fracture: Secondary | ICD-10-CM | POA: Diagnosis not present

## 2020-07-13 DIAGNOSIS — Z1231 Encounter for screening mammogram for malignant neoplasm of breast: Secondary | ICD-10-CM

## 2020-07-13 DIAGNOSIS — I10 Essential (primary) hypertension: Secondary | ICD-10-CM

## 2020-07-13 DIAGNOSIS — Z9889 Other specified postprocedural states: Secondary | ICD-10-CM | POA: Diagnosis not present

## 2020-07-13 DIAGNOSIS — E039 Hypothyroidism, unspecified: Secondary | ICD-10-CM

## 2020-07-13 DIAGNOSIS — E559 Vitamin D deficiency, unspecified: Secondary | ICD-10-CM

## 2020-07-13 DIAGNOSIS — I6521 Occlusion and stenosis of right carotid artery: Secondary | ICD-10-CM

## 2020-07-13 DIAGNOSIS — E871 Hypo-osmolality and hyponatremia: Secondary | ICD-10-CM | POA: Diagnosis not present

## 2020-07-13 MED ORDER — ALPRAZOLAM 0.5 MG PO TABS: 0.5000 mg | ORAL_TABLET | Freq: Two times a day (BID) | ORAL | 5 refills | Status: DC | PRN

## 2020-07-13 MED ORDER — PRAVASTATIN SODIUM 40 MG PO TABS: 40.0000 mg | ORAL_TABLET | Freq: Every day | ORAL | 3 refills | Status: DC

## 2020-07-13 MED ORDER — AMLODIPINE BESYLATE 5 MG PO TABS: 5.0000 mg | ORAL_TABLET | Freq: Every day | ORAL | 3 refills | Status: DC

## 2020-07-13 MED ORDER — AMLODIPINE BESYLATE 5 MG PO TABS: 5.0000 mg | ORAL_TABLET | Freq: Two times a day (BID) | ORAL | 3 refills | Status: DC

## 2020-07-13 MED ORDER — LISINOPRIL 20 MG PO TABS: 20.0000 mg | ORAL_TABLET | Freq: Two times a day (BID) | ORAL | 3 refills | Status: DC

## 2020-07-13 MED ORDER — AMOXICILLIN 500 MG PO CAPS: 2000.0000 mg | ORAL_CAPSULE | Freq: Once | ORAL | 0 refills | Status: DC | PRN

## 2020-07-13 NOTE — Progress Notes (Signed)
Chief Complaint  Patient presents with  . Follow-up   F/u  1. Hypothyroidism last TSH 12/2019 not at goal on synthyroid 50 mcg qd  Results for Helen Snow, Helen Snow (MRN [id number]) as of 07/13/2020 16:24  02/09/2019 12:51 TSH: 10.047 (H)  03/31/2019 11:26 TSH: 4.71 (H)  12/29/2019 10:18 TSH: 6.39 (H)  2. Osteoporosis will have pt f/u [facility]  3. BP elevated here and at home at times sbp 150s due to stressors at home on norvasc 5 mg qd was on 10 mg in the past and had stopped lis 20 mg bid but now taking stressor since 05/2020 is new neighbor who is  Loud and shes lived there for 18 years he plays loud sound box during the day and sleeps at nigh  Due to this she wants xanax 0.5 mg qd increased to bid   4. S/p right total hip and after this 01/26/20 she went to [facility] for a few weeks and did well and now hip is better  Needs amoxil 2000 prior to dental procedures 1 hr pr Dr. Marry Guan long term  Review of Systems  Constitutional: Negative for weight loss.  HENT: Negative for hearing loss.   Eyes: Negative for blurred vision.  Respiratory: Negative for shortness of breath.   Cardiovascular: Negative for chest pain.  Gastrointestinal: Negative for abdominal pain.  Musculoskeletal: Negative for joint pain.  Skin: Negative for rash.  Neurological: Negative for headaches.  Psychiatric/Behavioral: The patient is nervous/anxious.    Past Medical History:  Diagnosis Date  . Anxiety   . Arthritis   . Asthma    as child  . 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 Tami Ribas   . Hyperlipidemia   . Hypertension   . Hypothyroidism   . Osteoporosis   . Personal history of chemotherapy   . Personal history of radiation therapy   . Postmenopausal    Past Surgical History:  Procedure Laterality Date  . left tonsillar surgery     02/26/16 invasive moderate SCC  . TOTAL HIP ARTHROPLASTY Right 01/26/2020   Procedure: TOTAL HIP  ARTHROPLASTY;  Surgeon: Dereck Leep, MD;  Location: [facility] ORS;  Service: Orthopedics;  Laterality: Right;   Family History  Problem Relation Age of Onset  . Other Mother        legally blind  . Hearing loss Mother   . Heart disease Mother        MI  . Hyperlipidemia Mother   . Hypertension Mother   . Kidney disease Mother   . Cancer Father        lung smoker  . Breast cancer Neg Hx    Social History   Socioeconomic History  . Marital status: Married    Spouse name: Not on file  . Number of children: Not on file  . Years of education: Not on file  . Highest education level: Not on file  Occupational History  . Not on file  Tobacco Use  . Smoking status: Never Smoker  . Smokeless tobacco: Never Used  Vaping Use  . Vaping Use: Never used  Substance and Sexual Activity  . Alcohol use: Not Currently  . Drug use: Never  . Sexual activity: Not Currently  Other Topics Concern  . 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 Difficulty of Paying Living Expenses: Not hard at all  Food Insecurity: No Food Insecurity  . Worried About Charity fundraiser in the Last Year: Never true  . Ran Out of Food in the Last Year: Never true  Transportation Needs: No Transportation Needs  . Lack of Transportation (Medical): No  . Lack of Transportation (Non-Medical): No  Physical Activity: Not on file  Stress: No Stress Concern Present  . Feeling of Stress : Not at all  Social Connections: Unknown  . Frequency of Communication with Friends and Family: More than three times a week  . Frequency of Social Gatherings with Friends and Family: More than three times a week  . Attends Religious Services: Not on file  . Active Member of Clubs or Organizations: Not on file  . Attends Theatre manager Meetings: Not on file  . Marital Status: Widowed  Intimate Partner Violence: Not on file   Current Meds  Medication Sig  . amoxicillin (AMOXIL) 500 MG capsule Take 4 capsules (2,000 mg total) by mouth once as needed for up to 1 dose. 1 hour with food before dental procedure due to hip replacement  . ascorbic acid (VITAMIN C) 500 MG tablet Take 500 mg by mouth 2 (two) times daily.  . Calcium Carbonate-Vit D-Min (CALCIUM 1200 PO) Take 1 tablet by mouth daily.   . clobetasol (TEMOVATE) 0.05 % external solution Apply 1 application topically daily as needed (itching).   . diphenhydrAMINE (BENADRYL) 25 mg capsule Take 25 mg by mouth at bedtime as needed for sleep.  . finasteride (PROSCAR) 5 MG tablet Take 5 mg by mouth daily.  Marland Kitchen ipratropium (ATROVENT) 0.06 % nasal spray Place 2 sprays into both nostrils in the morning.   Marland Kitchen levothyroxine (SYNTHROID) 50 MCG tablet Take 1 tablet (50 mcg total) by mouth daily before breakfast. 30 minutes before food. Do not take with vitamins wait 4 hours after medication before vitamins  . lisinopril (ZESTRIL) 20 MG tablet Take 1 tablet (20 mg total) by mouth in the morning and at bedtime. If BP>130/>80 take a dose later in the day if BP <90/<60  . Menatetrenone (VITAMIN K2) 100 MCG TABS Take 100 mcg by mouth daily.  . Multiple Vitamin (MULTIVITAMIN WITH MINERALS) TABS tablet Take 1 tablet by mouth daily.  Marland Kitchen NALTREXONE HCL PO Take 3 mg by mouth daily.  . [DISCONTINUED] ALPRAZolam (XANAX) 0.5 MG tablet Take 1 tablet (0.5 mg total) by mouth daily as needed for anxiety.  . [DISCONTINUED] amLODipine (NORVASC) 5 MG tablet Take 1 tablet (5 mg total) by mouth daily.  . [DISCONTINUED] pravastatin (PRAVACHOL) 40 MG tablet Take 1 tablet (40 mg total) by mouth daily. (Patient taking differently: Take 40 mg by mouth at bedtime.)   Allergies  Allergen Reactions  . Pioglitazone Swelling    Fluid retention   No results found for this or any previous visit (from the  past 2160 hour(s)). Objective  Body mass index is 23.11 kg/m. Wt Readings from Last 3 Encounters:  07/13/20 141 lb (64 kg)  01/26/20 136 lb 11 oz (62 kg)  01/18/20 136 lb 14.4 oz (62.1 kg)   Temp Readings from Last 3 Encounters:  07/13/20 97.9 F (36.6 C) (Oral)  01/28/20 97.8 F (36.6 C)  01/18/20 98.8 F (37.1 C)   BP Readings from Last 3  Encounters:  07/13/20 134/76  01/28/20 (!) 124/48  01/18/20 131/71   Pulse Readings from Last 3 Encounters:  07/13/20 66  01/28/20 83  01/18/20 71    Physical Exam Vitals and nursing note reviewed.  Constitutional:      Appearance: Normal appearance. She is well-developed and well-groomed.  HENT:     Head: Normocephalic and atraumatic.  Cardiovascular:     Rate and Rhythm: Normal rate and regular rhythm.     Heart sounds: Normal heart sounds.  Abdominal:     Tenderness: There is no abdominal tenderness.  Skin:    General: Skin is warm and dry.  Neurological:     General: No focal deficit present.     Mental Status: She is alert and oriented to person, place, and time. Mental status is at baseline.     Gait: Gait normal.  Psychiatric:        Attention and Perception: Attention and perception normal.        Mood and Affect: Mood and affect normal.        Speech: Speech normal.        Behavior: Behavior normal. Behavior is cooperative.        Thought Content: Thought content normal.        Cognition and Memory: Cognition and memory normal.        Judgment: Judgment normal.     Assessment  Plan  Essential hypertension - Plan: Comprehensive metabolic panel, Lipid panel, CBC w/Diff, amLODipine (NORVASC) 5 MG tablet bid if BP >130/>80  With lis 20 mg bid  Hold if BP <90/<60   Anxiety - Plan: ALPRAZolam (XANAX) 0.5 MG tablet Bid prn   Stenosis of right carotid artery - Plan: pravastatin (PRAVACHOL) 40 MG tablet Consider carotid ultrasound in the future  Control risk factors   Hypothyroidism, unspecified type - Plan:  TSH Levo 50 mcg qd   Osteoporosis, unspecified osteoporosis type, unspecified pathological fracture presence - Plan: Ambulatory referral to Endocrinology  HM Flu shot utd  Had pna 24  utd prevnar  Wants to wait on shingrix as of the past Tdap had 10/01/17  covid vx had 3/3 consider 4th dose pt unsure as of 07/13/20 no h/o covid 36   Never had colonoscopy declines again 07/13/20 disc cologuard today wants to wait 07/13/20 showed demo when ready call and will order  Out of age window pap last 07/24/17 neg pap neg HPV no h/o abnormal pap  Mammogram 05/27/19 ordered  dexa 01/02/17 +osteoporosis on fosamax 06/2018 and for 3 years  -->referred back [facility] endocrine appt 07/15/19  Dermatology Dr. Clemon Chambers Q6 months no h/o nmsc AE appt 01/2019 h/o cataracts   Dentist Dr. Jacelyn Grip  ENT Tami Ribas 1 more visit and no f/u as of 07/13/20 H/o [facility]  Ortho dr. Marry Guan   Provider: Dr. Olivia Mackie McLean-Scocuzza-Internal Medicine

## 2020-07-13 NOTE — Patient Instructions (Signed)
Monitor BP and let me know if not controlled <130/<80

## 2020-07-13 NOTE — Telephone Encounter (Signed)
History of right carotid artery with plaque build up  I would consider a follow up with an ultrasound of her carotid is she agreeable?

## 2020-07-14 ENCOUNTER — Telehealth: Payer: Self-pay | Admitting: Internal Medicine

## 2020-07-14 DIAGNOSIS — E039 Hypothyroidism, unspecified: Secondary | ICD-10-CM

## 2020-07-14 LAB — CBC WITH DIFFERENTIAL/PLATELET
Basophils Absolute: 0 10*3/uL (ref 0.0–0.1)
Basophils Relative: 0.3 % (ref 0.0–3.0)
Eosinophils Absolute: 0.1 10*3/uL (ref 0.0–0.7)
Eosinophils Relative: 1.8 % (ref 0.0–5.0)
HCT: 38.6 % (ref 36.0–46.0)
Hemoglobin: 13.4 g/dL (ref 12.0–15.0)
Lymphocytes Relative: 12.7 % (ref 12.0–46.0)
Lymphs Abs: 0.9 10*3/uL (ref 0.7–4.0)
MCHC: 34.7 g/dL (ref 30.0–36.0)
MCV: 96 fl (ref 78.0–100.0)
Monocytes Absolute: 0.7 10*3/uL (ref 0.1–1.0)
Monocytes Relative: 9.1 % (ref 3.0–12.0)
Neutro Abs: 5.7 10*3/uL (ref 1.4–7.7)
Neutrophils Relative %: 76.1 % (ref 43.0–77.0)
Platelets: 219 10*3/uL (ref 150.0–400.0)
RBC: 4.02 Mil/uL (ref 3.87–5.11)
RDW: 12.3 % (ref 11.5–15.5)
WBC: 7.5 10*3/uL (ref 4.0–10.5)

## 2020-07-14 LAB — LIPID PANEL
Cholesterol: 155 mg/dL (ref 0–200)
HDL: 72.4 mg/dL (ref >39.00)
LDL Cholesterol: 68 mg/dL (ref 0–99)
NonHDL: 82.87
Total CHOL/HDL Ratio: 2
Triglycerides: 73 mg/dL (ref 0.0–149.0)
VLDL: 14.6 mg/dL (ref 0.0–40.0)

## 2020-07-14 LAB — COMPREHENSIVE METABOLIC PANEL
ALT: 23 U/L (ref 0–35)
AST: 22 U/L (ref 0–37)
Albumin: 4.7 g/dL (ref 3.5–5.2)
Alkaline Phosphatase: 66 U/L (ref 39–117)
BUN: 17 mg/dL (ref 6–23)
CO2: 28 mEq/L (ref 19–32)
Calcium: 9.7 mg/dL (ref 8.4–10.5)
Chloride: 94 mEq/L — ABNORMAL LOW (ref 96–112)
Creatinine, Ser: 0.66 mg/dL (ref 0.40–1.20)
GFR: 89.77 mL/min (ref >60.00)
Glucose, Bld: 93 mg/dL (ref 70–99)
Potassium: 4.4 mEq/L (ref 3.5–5.1)
Sodium: 130 mEq/L — ABNORMAL LOW (ref 135–145)
Total Bilirubin: 0.6 mg/dL (ref 0.2–1.2)
Total Protein: 6.8 g/dL (ref 6.0–8.3)

## 2020-07-14 LAB — VITAMIN D 25 HYDROXY (VIT D DEFICIENCY, FRACTURES): VITD: 33.74 ng/mL (ref 30.00–100.00)

## 2020-07-14 LAB — TSH: TSH: 1.4 u[IU]/mL (ref 0.35–4.50)

## 2020-07-14 MED ORDER — LEVOTHYROXINE SODIUM 50 MCG PO TABS: 50.0000 ug | ORAL_TABLET | Freq: Every day | ORAL | 3 refills | Status: DC

## 2020-07-14 NOTE — Telephone Encounter (Signed)
Which pharmacy for thyroid medication? Please resend dose in system 1 year supply thanks Call pt

## 2020-07-14 NOTE — Telephone Encounter (Signed)
Error

## 2020-07-14 NOTE — Telephone Encounter (Signed)
Sent to [facility] [address] per Patient request

## 2020-07-14 NOTE — Telephone Encounter (Signed)
Patient agreeable to ultrasound

## 2020-07-14 NOTE — Addendum Note (Signed)
Addended by: Thressa Sheller on: 07/14/2020 03:06 PM   Modules accepted: Orders

## 2020-07-18 ENCOUNTER — Other Ambulatory Visit: Payer: Self-pay | Admitting: Internal Medicine

## 2020-07-18 DIAGNOSIS — I6521 Occlusion and stenosis of right carotid artery: Secondary | ICD-10-CM

## 2020-07-24 DIAGNOSIS — E871 Hypo-osmolality and hyponatremia: Secondary | ICD-10-CM | POA: Insufficient documentation

## 2020-07-24 NOTE — Addendum Note (Signed)
Addended by: Orland Mustard on: 07/24/2020 12:02 AM   Modules accepted: Orders

## 2020-07-31 ENCOUNTER — Ambulatory Visit (INDEPENDENT_AMBULATORY_CARE_PROVIDER_SITE_OTHER): Payer: PPO

## 2020-07-31 ENCOUNTER — Other Ambulatory Visit: Payer: Self-pay

## 2020-07-31 ENCOUNTER — Other Ambulatory Visit: Payer: PPO

## 2020-07-31 DIAGNOSIS — E871 Hypo-osmolality and hyponatremia: Secondary | ICD-10-CM

## 2020-07-31 DIAGNOSIS — I700 Atherosclerosis of aorta: Secondary | ICD-10-CM | POA: Diagnosis not present

## 2020-08-02 ENCOUNTER — Encounter: Payer: Self-pay | Admitting: Internal Medicine

## 2020-08-02 DIAGNOSIS — I700 Atherosclerosis of aorta: Secondary | ICD-10-CM | POA: Insufficient documentation

## 2020-08-04 ENCOUNTER — Ambulatory Visit
Admission: RE | Admit: 2020-08-04 | Discharge: 2020-08-04 | Disposition: A | Discharge status: Home / Self Care | Payer: PPO | Source: Ambulatory Visit | Attending: Internal Medicine | Admitting: Internal Medicine

## 2020-08-04 DIAGNOSIS — I6521 Occlusion and stenosis of right carotid artery: Secondary | ICD-10-CM | POA: Diagnosis not present

## 2020-08-04 DIAGNOSIS — I6523 Occlusion and stenosis of bilateral carotid arteries: Secondary | ICD-10-CM | POA: Diagnosis not present

## 2020-08-04 DIAGNOSIS — E782 Mixed hyperlipidemia: Secondary | ICD-10-CM | POA: Diagnosis not present

## 2020-08-04 DIAGNOSIS — I10 Essential (primary) hypertension: Secondary | ICD-10-CM | POA: Diagnosis not present

## 2020-08-04 DIAGNOSIS — I7789 Other specified disorders of arteries and arterioles: Secondary | ICD-10-CM | POA: Diagnosis not present

## 2020-09-25 DIAGNOSIS — M810 Age-related osteoporosis without current pathological fracture: Secondary | ICD-10-CM | POA: Diagnosis not present

## 2020-10-10 ENCOUNTER — Telehealth: Payer: Self-pay

## 2020-10-10 NOTE — Telephone Encounter (Signed)
Received a call from [place] stating that patients Lisinopril and amlodipine have confusing instructions. They state that the instructions have been cut off and they would like them resent with full instructions.

## 2020-10-11 ENCOUNTER — Other Ambulatory Visit: Payer: Self-pay | Admitting: Internal Medicine

## 2020-10-11 DIAGNOSIS — I10 Essential (primary) hypertension: Secondary | ICD-10-CM

## 2020-10-11 MED ORDER — LISINOPRIL 20 MG PO TABS: 20.0000 mg | ORAL_TABLET | Freq: Two times a day (BID) | ORAL | 3 refills | Status: DC

## 2020-10-11 MED ORDER — AMLODIPINE BESYLATE 5 MG PO TABS: 5.0000 mg | ORAL_TABLET | Freq: Two times a day (BID) | ORAL | 3 refills | Status: DC

## 2020-10-11 NOTE — Telephone Encounter (Signed)
Clarify doses with HT lis can take 2x per day and norvasc can take 2x per day   Dr. Olivia Mackie Mclean-Scocuzza

## 2020-10-11 NOTE — Telephone Encounter (Signed)
Kristopher Oppenheim called about info below

## 2020-10-12 ENCOUNTER — Telehealth: Payer: Self-pay | Admitting: Internal Medicine

## 2020-10-12 NOTE — Telephone Encounter (Signed)
[place] called in to see if the directions for the lisinopril (ZESTRIL) 20 MG tablet and amLODipine (NORVASC) 5 MG tablet can be updated.When the prescription come through their system the information for the directions is not showing the complete directions.

## 2020-10-12 NOTE — Telephone Encounter (Signed)
Medication instructions were changed and re-sent electronically by Dr Olivia Mackie McLean-Scocuzza

## 2020-10-13 NOTE — Telephone Encounter (Signed)
Pt called to see if the direction had been changed, she is needing her medication now

## 2020-10-13 NOTE — Telephone Encounter (Signed)
Dr. Olivia Mackie Mclean-Scocuzza responded verbally and stated that if Blood pressure is Less than 90/60 than they do not need to take the 2nd dose.   Pharmacy has been notified. Pharmacy changed their prescription instructions to read: "Take 1 tablet by mouth in the morning. Check blood pressure at noon. If BP >130/>80 take an additional tablet at Bed time. If blood BP is <90/60, the 2nd dose is not needed."

## 2020-10-13 NOTE — Telephone Encounter (Signed)
Left a message for patient to call back. 

## 2020-10-18 NOTE — Telephone Encounter (Signed)
Called to speak with Helen Snow. Phone did not connect and stated that the voicemail box was not connected. Could not leave a voicemail.

## 2020-10-24 NOTE — Telephone Encounter (Addendum)
No voice mail.

## 2020-10-25 DIAGNOSIS — L821 Other seborrheic keratosis: Secondary | ICD-10-CM | POA: Diagnosis not present

## 2020-10-25 DIAGNOSIS — L661 Lichen planopilaris: Secondary | ICD-10-CM | POA: Diagnosis not present

## 2020-10-25 DIAGNOSIS — L538 Other specified erythematous conditions: Secondary | ICD-10-CM | POA: Diagnosis not present

## 2020-10-25 DIAGNOSIS — L298 Other pruritus: Secondary | ICD-10-CM | POA: Diagnosis not present

## 2020-10-25 NOTE — Telephone Encounter (Signed)
3 attempts to contact patient. [place] and spoke with Dimitri. Dimitri states that Jamicka has come to the pharmacy and has picked up her medication.

## 2020-11-07 ENCOUNTER — Telehealth: Payer: Self-pay | Admitting: Internal Medicine

## 2020-11-07 NOTE — Telephone Encounter (Signed)
Patient called in stating today that she had a home visit with the nurse for her insurance company and they recommend that her xanax be switched to a maintenance medicine.She is scheduled for an appointment 9/21,please advise if she can come in earlier.

## 2020-11-08 NOTE — Telephone Encounter (Signed)
Call patient For her safety, we can discuss anxiety at the time of visit.  Certainly you may look for earlier appointment however I will not make any changes to Xanax without in person visit

## 2020-11-08 NOTE — Telephone Encounter (Signed)
I called and let patient know that she was welcome to call & see if in the next couple weeks we have any cancellations. She will keep 9/21 appointment for now.

## 2020-11-14 ENCOUNTER — Ambulatory Visit: Payer: PPO

## 2020-11-17 LAB — FECAL OCCULT BLOOD, IMMUNOCHEMICAL: IFOBT: NEGATIVE

## 2020-11-29 ENCOUNTER — Ambulatory Visit: Payer: PPO | Admitting: Family

## 2020-12-07 DIAGNOSIS — M810 Age-related osteoporosis without current pathological fracture: Secondary | ICD-10-CM | POA: Diagnosis not present

## 2020-12-13 DIAGNOSIS — R682 Dry mouth, unspecified: Secondary | ICD-10-CM | POA: Diagnosis not present

## 2020-12-13 DIAGNOSIS — Z08 Encounter for follow-up examination after completed treatment for malignant neoplasm: Secondary | ICD-10-CM | POA: Diagnosis not present

## 2020-12-13 DIAGNOSIS — Z85818 Personal history of malignant neoplasm of other sites of lip, oral cavity, and pharynx: Secondary | ICD-10-CM | POA: Diagnosis not present

## 2021-01-08 ENCOUNTER — Encounter: Payer: Self-pay | Admitting: Internal Medicine

## 2021-01-17 ENCOUNTER — Encounter: Payer: Self-pay | Admitting: Internal Medicine

## 2021-01-17 ENCOUNTER — Ambulatory Visit (INDEPENDENT_AMBULATORY_CARE_PROVIDER_SITE_OTHER): Payer: PPO | Admitting: Internal Medicine

## 2021-01-17 ENCOUNTER — Other Ambulatory Visit: Payer: Self-pay

## 2021-01-17 VITALS — BP 114/76 | HR 65 | Temp 98.7°F | Ht 65.5 in | Wt 136.4 lb

## 2021-01-17 DIAGNOSIS — Z1389 Encounter for screening for other disorder: Secondary | ICD-10-CM | POA: Diagnosis not present

## 2021-01-17 DIAGNOSIS — M810 Age-related osteoporosis without current pathological fracture: Secondary | ICD-10-CM | POA: Diagnosis not present

## 2021-01-17 DIAGNOSIS — C099 Malignant neoplasm of tonsil, unspecified: Secondary | ICD-10-CM

## 2021-01-17 DIAGNOSIS — F419 Anxiety disorder, unspecified: Secondary | ICD-10-CM

## 2021-01-17 DIAGNOSIS — Z0000 Encounter for general adult medical examination without abnormal findings: Secondary | ICD-10-CM

## 2021-01-17 DIAGNOSIS — E559 Vitamin D deficiency, unspecified: Secondary | ICD-10-CM

## 2021-01-17 DIAGNOSIS — E871 Hypo-osmolality and hyponatremia: Secondary | ICD-10-CM | POA: Diagnosis not present

## 2021-01-17 DIAGNOSIS — I151 Hypertension secondary to other renal disorders: Secondary | ICD-10-CM | POA: Diagnosis not present

## 2021-01-17 DIAGNOSIS — Z13818 Encounter for screening for other digestive system disorders: Secondary | ICD-10-CM | POA: Diagnosis not present

## 2021-01-17 DIAGNOSIS — N2889 Other specified disorders of kidney and ureter: Secondary | ICD-10-CM

## 2021-01-17 DIAGNOSIS — E039 Hypothyroidism, unspecified: Secondary | ICD-10-CM

## 2021-01-17 MED ORDER — LORAZEPAM 1 MG PO TABS: 1.0000 mg | ORAL_TABLET | Freq: Two times a day (BID) | ORAL | 2 refills | Status: DC | PRN

## 2021-01-17 NOTE — Progress Notes (Signed)
Chief Complaint  Patient presents with   Follow-up   F/u  1.anxiety and depression phq score 10 and gad 7 score 12 home nurse rec zoloft of lexpro 2. Osteoporosis wants prolia done here 06/06/21 and had 12/07/20  3. Anxiety on xanax 0.5 mg bid needs something stronger  4. Hypothyroidism 50 mcg qd    Review of Systems  Constitutional:  Negative for weight loss.  HENT:  Negative for hearing loss.   Eyes:  Negative for blurred vision.  Respiratory:  Negative for shortness of breath.   Cardiovascular:  Negative for chest pain.  Gastrointestinal:  Negative for abdominal pain and blood in stool.  Genitourinary:  Negative for dysuria.  Musculoskeletal:  Negative for falls and joint pain.  Skin:  Negative for rash.  Neurological:  Negative for headaches.  Psychiatric/Behavioral:  Negative for depression.   Past Medical History:  Diagnosis Date   Anxiety    Arthritis    Asthma    as child   Cancer ([place]) 02/26/2016   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 Tami Ribas    Hyperlipidemia    Hypertension    Hypothyroidism    Osteoporosis    Personal history of chemotherapy    Personal history of radiation therapy    Postmenopausal    Past Surgical History:  Procedure Laterality Date   left tonsillar surgery     02/26/16 invasive moderate SCC   TOTAL HIP ARTHROPLASTY Right 01/26/2020   Procedure: TOTAL HIP ARTHROPLASTY;  Surgeon: Dereck Leep, MD;  Location: [facility] ORS;  Service: Orthopedics;  Laterality: Right;   Family History  Problem Relation Age of Onset   Other Mother        legally blind   Hearing loss Mother    Heart disease Mother        MI   Hyperlipidemia Mother    Hypertension Mother    Kidney disease Mother    Cancer Father        lung smoker   Breast cancer Neg Hx    Social History   Socioeconomic History   Marital status: Widowed    Spouse name: Not on file   Number of children: Not on file   Years of education: Not on file    Highest education level: Not on file  Occupational History   Not on file  Tobacco Use   Smoking status: Never   Smokeless tobacco: Never  Vaping Use   Vaping Use: Never used  Substance and Sexual Activity   Alcohol use: Not Currently   Drug use: Never   Sexual activity: Not Currently  Other Topics Concern   Not on file  Social History Narrative   Widowed    No kids    No siblings    Never smoker   No pets   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 If BP >130/>80   ascorbic acid (VITAMIN C) 500 MG tablet Take 500 mg by mouth 2 (two) times daily.  Calcium Carbonate-Vit D-Min (CALCIUM 1200 PO) Take 1 tablet by mouth daily.    clobetasol (TEMOVATE) 0.05 % external solution Apply 1 application topically daily as needed (itching).    denosumab (PROLIA) 60 MG/ML SOSY injection Inject into the skin every 6 (six) months.   diphenhydrAMINE (BENADRYL) 25 mg capsule Take 25 mg by mouth at bedtime as needed for sleep.   finasteride (PROSCAR) 5 MG tablet Take 5 mg by mouth daily.   ipratropium (ATROVENT) 0.06 % nasal spray Place 2 sprays into both nostrils in the morning.    levothyroxine (SYNTHROID) 50 MCG tablet Take 1 tablet (50 mcg total) by mouth daily before breakfast. 30 minutes before food. Do not take with vitamins wait 4 hours after medication before vitamins   lisinopril (ZESTRIL) 20 MG tablet Take 1 tablet (20 mg total) by mouth in the morning and at bedtime. If BP>130/>80 take a dose later in the day if BP <90/<60   LORazepam (ATIVAN) 1 MG tablet Take 1 tablet (1 mg  total) by mouth 2 (two) times daily as needed for anxiety. D/c xanax 0.5 bid   Menatetrenone (VITAMIN K2) 100 MCG TABS Take 100 mcg by mouth daily.   Multiple Vitamin (MULTIVITAMIN WITH MINERALS) TABS tablet Take 1 tablet by mouth daily.   pravastatin (PRAVACHOL) 40 MG tablet Take 1 tablet (40 mg total) by mouth at bedtime.   [DISCONTINUED] ALPRAZolam (XANAX) 0.5 MG tablet Take 1 tablet (0.5 mg total) by mouth 2 (two) times daily as needed for anxiety.   Allergies  Allergen Reactions   Pioglitazone Swelling    Fluid retention   Recent Results (from the past 2160 hour(s))  Fecal occult blood, imunochemical     Status: None   Collection Time: 11/17/20 12:00 AM  Result Value Ref Range   IFOBT Negative     Comment: FIT test negative fecal IA blood test    Objective  Body mass index is 22.35 kg/m. Wt Readings from Last 3 Encounters:  01/17/21 136 lb 6.4 oz (61.9 kg)  07/13/20 141 lb (64 kg)  01/26/20 136 lb 11 oz (62 kg)   Temp Readings from Last 3 Encounters:  01/17/21 98.7 F (37.1 C) (Oral)  07/13/20 97.9 F (36.6 C) (Oral)  01/28/20 97.8 F (36.6 C)   BP Readings from Last 3 Encounters:  01/17/21 114/76  07/13/20 134/76  01/28/20 (!) 124/48   Pulse Readings from Last 3 Encounters:  01/17/21 65  07/13/20 66  01/28/20 83    Physical Exam Vitals and nursing note reviewed.  Constitutional:      Appearance: Normal appearance. She is well-developed and well-groomed.  HENT:     Head: Normocephalic and atraumatic.  Eyes:     Conjunctiva/sclera: Conjunctivae normal.     Pupils: Pupils are equal, round, and reactive to light.  Cardiovascular:     Rate and Rhythm: Normal rate and regular rhythm.     Heart sounds: Normal heart sounds. No murmur heard. Pulmonary:     Effort: Pulmonary effort is normal.     Breath sounds: Normal breath sounds.  Abdominal:     General: Abdomen is flat. Bowel sounds are normal.     Tenderness: There is no abdominal tenderness.   Musculoskeletal:        General: No tenderness.  Skin:    General: Skin is warm and dry.  Neurological:     General: No focal deficit present.     Mental Status: She is alert and oriented to person, place,  and time. Mental status is at baseline.     Cranial Nerves: Cranial nerves 2-12 are intact.     Gait: Gait is intact.  Psychiatric:        Attention and Perception: Attention and perception normal.        Mood and Affect: Mood and affect normal.        Speech: Speech normal.        Behavior: Behavior normal. Behavior is cooperative.        Thought Content: Thought content normal.        Cognition and Memory: Cognition and memory normal.        Judgment: Judgment normal.    Assessment  Plan  Anxiety - Plan: LORazepam (ATIVAN) 1 MG tablet bid prn  D/c xanax 0.5 mg bid  Hyponatremia - Plan: Basic Metabolic Panel (BMET), Microalbumin / creatinine urine ratio, Sodium, urine, random  Vitamin D deficiency - Plan: Vitamin D (25 hydroxy)   Hypothyroidism, unspecified type - Plan: TSH, TSH  On levo 50 mcg    Osteoporosis, unspecified osteoporosis type, unspecified pathological fracture presence  On prolia 12/07/20 due 06/06/21    HM Flu shot utd  Had pna 23  utd prevnar  Wants to wait on shingrix as of the past Tdap had 10/01/17  covid vx had 3/3 consider 4th dose pt unsure as of 07/13/20 no h/o covid 51    Never had colonoscopy declines again  Fit test 11/07/20 home test   07/13/20 disc cologuard today wants to wait 07/13/20 showed demo when ready call and will order  Out of age window pap last 07/24/17 neg pap neg HPV no h/o abnormal pap   Mammogram 05/27/19 ordered  dexa 01/02/17 +osteoporosis on fosamax 06/2018 and for 3 years  -->referred back [facility] endocrine appt 07/15/19  Dermatology Dr. Clemon Chambers Q6 months no h/o nmsc  AE appt 01/2019 h/o cataracts  Dr. Marry Guan appt 01/22/21 right shoulder pain    Dentist Dr. Jacelyn Grip  ENT Tami Ribas 1 more visit and no f/u as of 07/13/20 H/o  finnegan  [facility]  Ortho dr. Marry Guan   Provider: Dr. Olivia Mackie McLean-Scocuzza-Internal Medicine

## 2021-01-18 LAB — BASIC METABOLIC PANEL
BUN: 17 mg/dL (ref 6–23)
CO2: 27 mEq/L (ref 19–32)
Calcium: 9.2 mg/dL (ref 8.4–10.5)
Chloride: 94 mEq/L — ABNORMAL LOW (ref 96–112)
Creatinine, Ser: 0.72 mg/dL (ref 0.40–1.20)
GFR: 85.25 mL/min (ref >60.00)
Glucose, Bld: 86 mg/dL (ref 70–99)
Potassium: 4.1 mEq/L (ref 3.5–5.1)
Sodium: 130 mEq/L — ABNORMAL LOW (ref 135–145)

## 2021-01-18 LAB — MICROALBUMIN / CREATININE URINE RATIO
Creatinine,U: 23.9 mg/dL
Microalb Creat Ratio: 2.9 mg/g (ref 0.0–30.0)
Microalb, Ur: <0.7 mg/dL (ref 0.0–1.9)

## 2021-01-18 LAB — TSH: TSH: 5.46 u[IU]/mL (ref 0.35–5.50)

## 2021-01-19 LAB — SODIUM, URINE, RANDOM: Sodium, Ur: 25 mmol/L — ABNORMAL LOW (ref 28–272)

## 2021-01-22 ENCOUNTER — Telehealth: Payer: Self-pay | Admitting: Internal Medicine

## 2021-01-22 ENCOUNTER — Other Ambulatory Visit: Payer: Self-pay | Admitting: Internal Medicine

## 2021-01-22 DIAGNOSIS — E039 Hypothyroidism, unspecified: Secondary | ICD-10-CM

## 2021-01-22 MED ORDER — LEVOTHYROXINE SODIUM 75 MCG PO TABS: 75.0000 ug | ORAL_TABLET | Freq: Every day | ORAL | 3 refills | Status: DC

## 2021-01-22 NOTE — Progress Notes (Signed)
Sodium still slightly low  How much water is drinking sometimes if not enough or too much this could be the cause -about 55-64 ounces daily is enough Also we could work up further by doing echo I.e ultrasound of heart and ultrasound of the arteries of of her kidneys?  Does she want to do this? Tsh is elevating is she agreeable to increase dose of thyroid medication slightly?  75 mcg ?

## 2021-01-22 NOTE — Telephone Encounter (Signed)
Ativan 1 mg bid is already a high dose I would try this for now and may try 0.5, 0.5 and 1 mg at night What about to the ?s on imaging Resent thyroid medication 75 mcg  Sch f/u in 3-4 months in person thanks

## 2021-01-22 NOTE — Telephone Encounter (Signed)
-----   Message from Helen Jackson, MD sent at 01/22/2021 10:26 AM EST ----- Sodium still slightly low  How much water is drinking sometimes if not enough or too much this could be the cause -about 55-64 ounces daily is enough Also we could work up further by doing echo I.e ultrasound of heart and ultrasound of the arteries of of her kidneys?  Does she want to do this? Tsh is elevating is she agreeable to increase dose of thyroid medication slightly?  75 mcg ?

## 2021-01-22 NOTE — Telephone Encounter (Signed)
Pt called in regards to message about increasing thyroid medication from 16mcg to 83mcg. Pt is completely ok and wanting to increase medication. Pt uses [facility] on [address] in [place]. Pt also states she will start monitoring her water intake. Pt states she has a high water intake and will start drinking at most 64oz a day. Pt also has an update on medication LORAZEPAM. Pt states she is doing wonderfully, it has made a tremendous difference. Pt is also wondering if the medication could possibly increased to 3 pills a day instead of two. Pt was advised someone will be in contact with her to answer her questions.

## 2021-01-23 NOTE — Telephone Encounter (Signed)
Patient informed and verbalized understanding. Will cut Lorazepam in half morning and afternoon.   Patient agreeable to kidney imaging and denies the imaging of the heart. States she is more concerned about kidneys with personal and family history.   Scheduled.

## 2021-01-24 ENCOUNTER — Telehealth: Payer: Self-pay | Admitting: Internal Medicine

## 2021-01-24 NOTE — Telephone Encounter (Signed)
Ok will order [place] and be scheduled for patient  Consider echo with ho htn in the future

## 2021-01-24 NOTE — Telephone Encounter (Signed)
Lft pt vm to call ofc to sch US renal. thanks 

## 2021-01-24 NOTE — Addendum Note (Signed)
Addended by: Orland Mustard on: 01/24/2021 01:34 PM   Modules accepted: Orders

## 2021-02-07 ENCOUNTER — Ambulatory Visit
Admission: RE | Admit: 2021-02-07 | Discharge: 2021-02-07 | Disposition: A | Discharge status: Home / Self Care | Payer: PPO | Source: Ambulatory Visit | Attending: Internal Medicine | Admitting: Internal Medicine

## 2021-02-07 DIAGNOSIS — E871 Hypo-osmolality and hyponatremia: Secondary | ICD-10-CM | POA: Insufficient documentation

## 2021-02-07 DIAGNOSIS — N2889 Other specified disorders of kidney and ureter: Secondary | ICD-10-CM | POA: Insufficient documentation

## 2021-02-07 DIAGNOSIS — I151 Hypertension secondary to other renal disorders: Secondary | ICD-10-CM | POA: Diagnosis not present

## 2021-02-07 DIAGNOSIS — I10 Essential (primary) hypertension: Secondary | ICD-10-CM | POA: Diagnosis not present

## 2021-02-08 ENCOUNTER — Telehealth: Payer: Self-pay | Admitting: Internal Medicine

## 2021-02-08 NOTE — Telephone Encounter (Signed)
Pt called in requesting results about ultrasound. Pt stated that she received the result in mychart but Pt doesn't understand what some of the stuff means. Pt stated she also have questions about results. Pt requesting callback. Pt stated she will be available until 3pm today

## 2021-02-09 NOTE — Telephone Encounter (Signed)
Pt called in regards to results

## 2021-02-09 NOTE — Telephone Encounter (Signed)
LMTCB

## 2021-02-12 NOTE — Telephone Encounter (Signed)
Normal kidney arteries Does she want to do heart echo to work up low sodium  Written by Delorise Jackson, MD on 02/07/2021 12:46 PM EST Seen by patient Helen Snow on 02/08/2021  9:39 AM  Patient informed and verbalized understanding.

## 2021-03-13 DIAGNOSIS — H2513 Age-related nuclear cataract, bilateral: Secondary | ICD-10-CM | POA: Diagnosis not present

## 2021-03-13 NOTE — Telephone Encounter (Signed)
Pt called in regards to medication  LORazepam (ATIVAN) 1 MG tablet Pt states this medication is not working for her anymore. Pt states it is just making her sleepy and when she wakes up there is no difference. Pt also states she is nauseous and wants to know if she can be prescribed something for this. Pt doesn't know if it is because of medication or because of current situation of having to relocate from her home.

## 2021-03-13 NOTE — Telephone Encounter (Signed)
Please advise 

## 2021-03-14 ENCOUNTER — Other Ambulatory Visit: Payer: Self-pay | Admitting: Internal Medicine

## 2021-03-14 ENCOUNTER — Telehealth: Payer: Self-pay | Admitting: Internal Medicine

## 2021-03-14 DIAGNOSIS — R110 Nausea: Secondary | ICD-10-CM

## 2021-03-14 MED ORDER — ONDANSETRON HCL 4 MG PO TABS: 4.0000 mg | ORAL_TABLET | Freq: Three times a day (TID) | ORAL | 0 refills | Status: DC | PRN

## 2021-03-14 NOTE — Telephone Encounter (Signed)
Does she want referral to therapy?  Does she want to change medication back to xanax shorter acting medication and lower dose ? Sent zofran for nausea

## 2021-03-15 NOTE — Telephone Encounter (Signed)
3x per day is a high dose of ativan and I do not recommend this increased risk for falls, memory loss   Therapy consider psychiatry is recommended if the situation environment does not change   PA needed for zofran I think

## 2021-03-15 NOTE — Telephone Encounter (Signed)
Patient declines therapy at this time. States this is brought up from her neighbor and trying to move.   Patient states she will stick with this medication for a bit longer and will pick up the Zofran. Patient states she thinks it will help her to take the entire pill three times daily. States she feels no difference when taking the half pill. States taking the entire pill helped some.

## 2021-03-19 NOTE — Telephone Encounter (Signed)
Left message to return call 

## 2021-03-21 NOTE — Telephone Encounter (Signed)
Patient called office back about her refills.

## 2021-03-22 NOTE — Telephone Encounter (Signed)
Helen Snow, CMA  01/22/2021  2:49 PM EST Back to Top    Patient has seen results on mychart. See 01/22/21 telephone encounter.    Helen Glow McLean-Scocuzza, MD  01/22/2021 10:26 AM EST     Sodium still slightly low  How much water is drinking sometimes if not enough or too much this could be the cause -about 55-64 ounces daily is enough Also we could work up further by doing echo I.e ultrasound of heart and ultrasound of the arteries of of her kidneys?  Does she want to do this? Tsh is elevating is she agreeable to increase dose of thyroid medication slightly?  75 mcg ?

## 2021-03-26 ENCOUNTER — Other Ambulatory Visit: Payer: Self-pay | Admitting: Internal Medicine

## 2021-03-26 DIAGNOSIS — F419 Anxiety disorder, unspecified: Secondary | ICD-10-CM

## 2021-03-26 MED ORDER — LORAZEPAM 1 MG PO TABS: 1.0000 mg | ORAL_TABLET | Freq: Two times a day (BID) | ORAL | 2 refills | Status: DC | PRN

## 2021-03-26 NOTE — Telephone Encounter (Signed)
Patient calling back in about the PA for Zofran. States she is no longer needing this as she has been using club soda over the counter.

## 2021-03-26 NOTE — Telephone Encounter (Signed)
Sent [facility] [address] any benzo I rec max 2x per day dosing and consider other means to deal with anxiety I.e exercise, change of environment, meditation, therapy as these medications are addictive  If this doesn't work psychiatric is recommended

## 2021-03-26 NOTE — Telephone Encounter (Signed)
Patient informed and verbalized understanding.  She will take one full tablet twice daily as prescribed.   Patient would like a refill of the Lorazepam sent in as she states her anxiety will get better once she sales her house and moves away from this neighbor.  Patient wanting to know what the maximum dose a day of Xanax would be?

## 2021-03-28 NOTE — Telephone Encounter (Signed)
I advised patient on message below. She stated that she was signing on the closing of her house tomorrow & should be moved into [facility] [place] by first of April. This has been the big source of her stress & she feels like there is an end in sight. She did say that she has had horrible back pain the few days & does have an appointment with Dr. Marry Guan & will address.

## 2021-03-28 NOTE — Telephone Encounter (Signed)
Noted  

## 2021-03-29 DIAGNOSIS — Z96641 Presence of right artificial hip joint: Secondary | ICD-10-CM | POA: Diagnosis not present

## 2021-03-29 DIAGNOSIS — I700 Atherosclerosis of aorta: Secondary | ICD-10-CM | POA: Diagnosis not present

## 2021-03-29 DIAGNOSIS — M7581 Other shoulder lesions, right shoulder: Secondary | ICD-10-CM | POA: Diagnosis not present

## 2021-04-18 ENCOUNTER — Ambulatory Visit
Admission: RE | Admit: 2021-04-18 | Discharge: 2021-04-18 | Disposition: A | Discharge status: Home / Self Care | Payer: PPO | Source: Ambulatory Visit | Attending: Internal Medicine | Admitting: Internal Medicine

## 2021-04-18 ENCOUNTER — Other Ambulatory Visit: Payer: Self-pay

## 2021-04-18 DIAGNOSIS — Z1231 Encounter for screening mammogram for malignant neoplasm of breast: Secondary | ICD-10-CM | POA: Insufficient documentation

## 2021-05-02 DIAGNOSIS — L661 Lichen planopilaris: Secondary | ICD-10-CM | POA: Diagnosis not present

## 2021-05-02 DIAGNOSIS — Z79899 Other long term (current) drug therapy: Secondary | ICD-10-CM | POA: Diagnosis not present

## 2021-06-11 ENCOUNTER — Other Ambulatory Visit (INDEPENDENT_AMBULATORY_CARE_PROVIDER_SITE_OTHER): Payer: PPO

## 2021-06-11 DIAGNOSIS — Z13818 Encounter for screening for other digestive system disorders: Secondary | ICD-10-CM

## 2021-06-11 DIAGNOSIS — E039 Hypothyroidism, unspecified: Secondary | ICD-10-CM

## 2021-06-11 DIAGNOSIS — Z1389 Encounter for screening for other disorder: Secondary | ICD-10-CM | POA: Diagnosis not present

## 2021-06-11 DIAGNOSIS — Z0000 Encounter for general adult medical examination without abnormal findings: Secondary | ICD-10-CM | POA: Diagnosis not present

## 2021-06-11 DIAGNOSIS — E559 Vitamin D deficiency, unspecified: Secondary | ICD-10-CM

## 2021-06-11 LAB — LIPID PANEL
Cholesterol: 150 mg/dL (ref 0–200)
HDL: 79.7 mg/dL (ref >39.00)
LDL Cholesterol: 59 mg/dL (ref 0–99)
NonHDL: 69.96
Total CHOL/HDL Ratio: 2
Triglycerides: 55 mg/dL (ref 0.0–149.0)
VLDL: 11 mg/dL (ref 0.0–40.0)

## 2021-06-11 LAB — CBC WITH DIFFERENTIAL/PLATELET
Basophils Absolute: 0 10*3/uL (ref 0.0–0.1)
Basophils Relative: 0.2 % (ref 0.0–3.0)
Eosinophils Absolute: 0 10*3/uL (ref 0.0–0.7)
Eosinophils Relative: 0.2 % (ref 0.0–5.0)
HCT: 36.7 % (ref 36.0–46.0)
Hemoglobin: 12.7 g/dL (ref 12.0–15.0)
Lymphocytes Relative: 6.3 % — ABNORMAL LOW (ref 12.0–46.0)
Lymphs Abs: 0.3 10*3/uL — ABNORMAL LOW (ref 0.7–4.0)
MCHC: 34.7 g/dL (ref 30.0–36.0)
MCV: 97.9 fl (ref 78.0–100.0)
Monocytes Absolute: 0.9 10*3/uL (ref 0.1–1.0)
Monocytes Relative: 17.7 % — ABNORMAL HIGH (ref 3.0–12.0)
Neutro Abs: 3.8 10*3/uL (ref 1.4–7.7)
Neutrophils Relative %: 75.6 % (ref 43.0–77.0)
Platelets: 154 10*3/uL (ref 150.0–400.0)
RBC: 3.75 Mil/uL — ABNORMAL LOW (ref 3.87–5.11)
RDW: 11.6 % (ref 11.5–15.5)
WBC: 5.1 10*3/uL (ref 4.0–10.5)

## 2021-06-11 LAB — URINALYSIS, ROUTINE W REFLEX MICROSCOPIC
Bilirubin Urine: NEGATIVE
Hgb urine dipstick: NEGATIVE
Ketones, ur: NEGATIVE
Leukocytes,Ua: NEGATIVE
Nitrite: NEGATIVE
RBC / HPF: NONE SEEN (ref 0–?)
Specific Gravity, Urine: <=1.005 — AB (ref 1.000–1.030)
Total Protein, Urine: NEGATIVE
Urine Glucose: NEGATIVE
Urobilinogen, UA: 0.2 (ref 0.0–1.0)
pH: 7 (ref 5.0–8.0)

## 2021-06-11 LAB — COMPREHENSIVE METABOLIC PANEL
ALT: 24 U/L (ref 0–35)
AST: 32 U/L (ref 0–37)
Albumin: 4.7 g/dL (ref 3.5–5.2)
Alkaline Phosphatase: 48 U/L (ref 39–117)
BUN: 10 mg/dL (ref 6–23)
CO2: 26 mEq/L (ref 19–32)
Calcium: 9.1 mg/dL (ref 8.4–10.5)
Chloride: 96 mEq/L (ref 96–112)
Creatinine, Ser: 0.75 mg/dL (ref 0.40–1.20)
GFR: 80.95 mL/min (ref >60.00)
Glucose, Bld: 83 mg/dL (ref 70–99)
Potassium: 4 mEq/L (ref 3.5–5.1)
Sodium: 133 mEq/L — ABNORMAL LOW (ref 135–145)
Total Bilirubin: 0.6 mg/dL (ref 0.2–1.2)
Total Protein: 6.4 g/dL (ref 6.0–8.3)

## 2021-06-11 LAB — TSH: TSH: 1.89 u[IU]/mL (ref 0.35–5.50)

## 2021-06-11 LAB — VITAMIN D 25 HYDROXY (VIT D DEFICIENCY, FRACTURES): VITD: 33.3 ng/mL (ref 30.00–100.00)

## 2021-06-12 ENCOUNTER — Telehealth: Payer: Self-pay

## 2021-06-12 NOTE — Telephone Encounter (Signed)
-----   Message from Delorise Jackson, MD sent at 06/12/2021  8:34 AM EDT ----- ?Urine ok  ?Monocytes elevated and lymphocytes low  ?-does she want to meet with the blood specialist hematology to follow up on this ? ?Sodium slightly low but improved otherwise kidney labs normal  ?Vitamin D and thyroid normal  ?Cholesterol normal  ? ?

## 2021-06-12 NOTE — Telephone Encounter (Signed)
Lm for pt to cb re: results ?

## 2021-06-13 ENCOUNTER — Other Ambulatory Visit: Payer: PPO

## 2021-06-14 ENCOUNTER — Telehealth: Payer: Self-pay | Admitting: Internal Medicine

## 2021-06-14 ENCOUNTER — Encounter: Payer: Self-pay | Admitting: Internal Medicine

## 2021-06-14 ENCOUNTER — Ambulatory Visit (INDEPENDENT_AMBULATORY_CARE_PROVIDER_SITE_OTHER): Payer: PPO | Admitting: Internal Medicine

## 2021-06-14 VITALS — BP 110/60 | HR 78 | Temp 98.7°F | Ht 65.5 in | Wt 134.6 lb

## 2021-06-14 DIAGNOSIS — R6881 Early satiety: Secondary | ICD-10-CM

## 2021-06-14 DIAGNOSIS — M810 Age-related osteoporosis without current pathological fracture: Secondary | ICD-10-CM | POA: Diagnosis not present

## 2021-06-14 DIAGNOSIS — R011 Cardiac murmur, unspecified: Secondary | ICD-10-CM | POA: Diagnosis not present

## 2021-06-14 DIAGNOSIS — I6521 Occlusion and stenosis of right carotid artery: Secondary | ICD-10-CM

## 2021-06-14 DIAGNOSIS — R634 Abnormal weight loss: Secondary | ICD-10-CM

## 2021-06-14 DIAGNOSIS — R112 Nausea with vomiting, unspecified: Secondary | ICD-10-CM

## 2021-06-14 DIAGNOSIS — F419 Anxiety disorder, unspecified: Secondary | ICD-10-CM | POA: Diagnosis not present

## 2021-06-14 DIAGNOSIS — E039 Hypothyroidism, unspecified: Secondary | ICD-10-CM

## 2021-06-14 DIAGNOSIS — R110 Nausea: Secondary | ICD-10-CM

## 2021-06-14 DIAGNOSIS — D72821 Monocytosis (symptomatic): Secondary | ICD-10-CM

## 2021-06-14 DIAGNOSIS — I10 Essential (primary) hypertension: Secondary | ICD-10-CM

## 2021-06-14 DIAGNOSIS — J0110 Acute frontal sinusitis, unspecified: Secondary | ICD-10-CM | POA: Diagnosis not present

## 2021-06-14 MED ORDER — AMLODIPINE BESYLATE 5 MG PO TABS: 5.0000 mg | ORAL_TABLET | Freq: Two times a day (BID) | ORAL | 3 refills | Status: DC

## 2021-06-14 MED ORDER — LORAZEPAM 1 MG PO TABS: 1.0000 mg | ORAL_TABLET | Freq: Two times a day (BID) | ORAL | 2 refills | Status: DC | PRN

## 2021-06-14 MED ORDER — LEVOTHYROXINE SODIUM 75 MCG PO TABS: 75.0000 ug | ORAL_TABLET | Freq: Every day | ORAL | 3 refills | Status: DC

## 2021-06-14 MED ORDER — PRAVASTATIN SODIUM 40 MG PO TABS: 40.0000 mg | ORAL_TABLET | Freq: Every day | ORAL | 3 refills | Status: DC

## 2021-06-14 MED ORDER — LISINOPRIL 20 MG PO TABS: 20.0000 mg | ORAL_TABLET | Freq: Two times a day (BID) | ORAL | 3 refills | Status: DC

## 2021-06-14 MED ORDER — AMOXICILLIN-POT CLAVULANATE 875-125 MG PO TABS
1.0000 | ORAL_TABLET | Freq: Two times a day (BID) | ORAL | 0 refills | Status: DC
Start: 2021-06-14 — End: 2021-12-14

## 2021-06-14 NOTE — Progress Notes (Signed)
Chief Complaint  ?Patient presents with  ? Follow-up  ? Osteoporosis  ? ?F/u  ?1. Osteoporosis wants to get prolia injections here  ?2. C/o h/a with sinus pressure since Sunday and Monday temp was 101.3 tried mucinex voice is hoarse  ?3. Anxiety on ativan 1 mg bid recently moved [facility] [place] into apt and anxiety about living on upper floor and axniety with tmove  ?4. Hypothyroidism on levo 75 mcg qd  ?5. Htn lis 20 mg bid and norvasc 5 mg qd  ?6. Having early satiety, nausea will do CT scan ab pelvis for now thinks related to anxiety  ?7. C/o 5/10 shoulder pain  ? ?Review of Systems  ?Constitutional:  Negative for weight loss.  ?HENT:  Positive for sinus pain. Negative for hearing loss.   ?Eyes:  Negative for blurred vision.  ?Respiratory:  Negative for shortness of breath.   ?Cardiovascular:  Negative for chest pain.  ?Gastrointestinal:  Positive for nausea. Negative for abdominal pain and blood in stool.  ?Genitourinary:  Negative for dysuria.  ?Musculoskeletal:  Negative for falls and joint pain.  ?Skin:  Negative for rash.  ?Neurological:  Negative for headaches.  ?Psychiatric/Behavioral:  Negative for depression. The patient is nervous/anxious.   ?Past Medical History:  ?Diagnosis Date  ? Anxiety   ? Arthritis   ? Asthma   ? as child  ? Cancer ([place]) 02/26/2016  ? 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 Tami Ribas   ? Hyperlipidemia   ? Hypertension   ? Hypothyroidism   ? Osteoporosis   ? Personal history of chemotherapy   ? Personal history of radiation therapy   ? Postmenopausal   ? ?Past Surgical History:  ?Procedure Laterality Date  ? left tonsillar surgery    ? 02/26/16 invasive moderate SCC  ? TOTAL HIP ARTHROPLASTY Right 01/26/2020  ? Procedure: TOTAL HIP ARTHROPLASTY;  Surgeon: Dereck Leep, MD;  Location: [facility] ORS;  Service: Orthopedics;  Laterality: Right;  ? ?Family History  ?Problem Relation Age of Onset  ? Other Mother   ?     legally blind  ? Hearing loss Mother    ? Heart disease Mother   ?     MI  ? Hyperlipidemia Mother   ? Hypertension Mother   ? Kidney disease Mother   ? Cancer Father   ?     lung smoker  ? Breast cancer Neg Hx   ? ?Social History  ? ?Socioeconomic History  ? Marital status: Widowed  ?  Spouse name: Not on file  ? Number of children: Not on file  ? Years of education: Not on file  ? Highest education level: Not on file  ?Occupational History  ? Not on file  ?Tobacco Use  ? Smoking status: Never  ? Smokeless tobacco: Never  ?Vaping Use  ? Vaping Use: Never used  ?Substance and Sexual Activity  ? Alcohol use: Not Currently  ? Drug use: Never  ? Sexual activity: Not Currently  ?Other Topics Concern  ? Not on file  ?Social History Narrative  ? Widowed   ? No kids   ? No siblings   ? Never smoker  ? No pets  ? 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 POA Rocky Herring   ? ?Social Determinants of Health  ? ?Financial Resource Strain: Not on file  ?Food Insecurity: Not on file  ?Transportation Needs: Not on file  ?Physical Activity:  Not on file  ?Stress: Not on file  ?Social Connections: Not on file  ?Intimate Partner Violence: Not on file  ? ?Current Meds  ?Medication Sig  ? amoxicillin (AMOXIL) 500 MG capsule Take 4 capsules (2,000 mg total) by mouth once as needed for up to 1 dose. 1 hour with food before dental procedure due to hip replacement  ? amoxicillin-clavulanate (AUGMENTIN) 875-125 MG tablet Take 1 tablet by mouth 2 (two) times daily. With food  ? ascorbic acid (VITAMIN C) 500 MG tablet Take 500 mg by mouth 2 (two) times daily.  ? Calcium Carbonate-Vit D-Min (CALCIUM 1200 PO) Take 1 tablet by mouth daily.   ? clobetasol (TEMOVATE) 0.05 % external solution Apply 1 application topically daily as needed (itching).   ? denosumab (PROLIA) 60 MG/ML SOSY injection Inject into the skin every 6 (six) months.  ? diphenhydrAMINE (BENADRYL) 25 mg capsule Take 25 mg by mouth at bedtime as needed for sleep.  ? finasteride (PROSCAR) 5 MG tablet  Take 5 mg by mouth daily.  ? ipratropium (ATROVENT) 0.06 % nasal spray Place 2 sprays into both nostrils in the morning.   ? Menatetrenone (VITAMIN K2) 100 MCG TABS Take 100 mcg by mouth daily.  ? minoxidil (LONITEN) 2.5 MG tablet Take 1.25 mg by mouth daily.  ? Multiple Vitamin (MULTIVITAMIN WITH MINERALS) TABS tablet Take 1 tablet by mouth daily.  ? [DISCONTINUED] amLODipine (NORVASC) 5 MG tablet Take 1 tablet (5 mg total) by mouth in the morning and at bedtime. If BP >130/>80  ? [DISCONTINUED] levothyroxine (SYNTHROID) 75 MCG tablet Take 1 tablet (75 mcg total) by mouth daily before breakfast. 30 minutes before food. Do not take with vitamins wait 4 hours after medication before vitamins  ? [DISCONTINUED] lisinopril (ZESTRIL) 20 MG tablet Take 1 tablet (20 mg total) by mouth in the morning and at bedtime. If BP>130/>80 take a dose later in the day if BP <90/<60  ? [DISCONTINUED] LORazepam (ATIVAN) 1 MG tablet Take 1 tablet (1 mg total) by mouth 2 (two) times daily as needed for anxiety. D/c xanax 0.5 bid  ? [DISCONTINUED] ondansetron (ZOFRAN) 4 MG tablet TAKE 1 TABLET BY MOUTH EVERY 8 HOURS AS NEEDED  ? [DISCONTINUED] pravastatin (PRAVACHOL) 40 MG tablet Take 1 tablet (40 mg total) by mouth at bedtime.  ? ?Allergies  ?Allergen Reactions  ? Pioglitazone Swelling  ?  Fluid retention  ? ?Recent Results (from the past 2160 hour(s))  ?Vitamin D (25 hydroxy)     Status: None  ? Collection Time: 06/11/21  9:26 AM  ?Result Value Ref Range  ? VITD 33.30 30.00 - 100.00 ng/mL  ?Urinalysis, Routine w reflex microscopic     Status: Abnormal  ? Collection Time: 06/11/21  9:26 AM  ?Result Value Ref Range  ? Color, Urine YELLOW Yellow;Lt. Yellow;Straw;Dark Yellow;Amber;Green;Red;Brown  ? APPearance CLEAR Clear;Turbid;Slightly Cloudy;Cloudy  ? Specific Gravity, Urine <=1.005 (A) 1.000 - 1.030  ? pH 7.0 5.0 - 8.0  ? Total Protein, Urine NEGATIVE Negative  ? Urine Glucose NEGATIVE Negative  ? Ketones, ur NEGATIVE Negative  ?  Bilirubin Urine NEGATIVE Negative  ? Hgb urine dipstick NEGATIVE Negative  ? Urobilinogen, UA 0.2 0.0 - 1.0  ? Leukocytes,Ua NEGATIVE Negative  ? Nitrite NEGATIVE Negative  ? WBC, UA 0-2/hpf 0-2/hpf  ? RBC / HPF none seen 0-2/hpf  ? Squamous Epithelial / LPF Rare(0-4/hpf) Rare(0-4/hpf)  ?TSH     Status: None  ? Collection Time: 06/11/21  9:26 AM  ?Result Value  Ref Range  ? TSH 1.89 0.35 - 5.50 uIU/mL  ?CBC with Differential/Platelet     Status: Abnormal  ? Collection Time: 06/11/21  9:26 AM  ?Result Value Ref Range  ? WBC 5.1 4.0 - 10.5 K/uL  ? RBC 3.75 (L) 3.87 - 5.11 Mil/uL  ? Hemoglobin 12.7 12.0 - 15.0 g/dL  ? HCT 36.7 36.0 - 46.0 %  ? MCV 97.9 78.0 - 100.0 fl  ? MCHC 34.7 30.0 - 36.0 g/dL  ? RDW 11.6 11.5 - 15.5 %  ? Platelets 154.0 150.0 - 400.0 K/uL  ? Neutrophils Relative % 75.6 43.0 - 77.0 %  ? Lymphocytes Relative 6.3 (L) 12.0 - 46.0 %  ? Monocytes Relative 17.7 Repeated and verified X2. (H) 3.0 - 12.0 %  ? Eosinophils Relative 0.2 0.0 - 5.0 %  ? Basophils Relative 0.2 0.0 - 3.0 %  ? Neutro Abs 3.8 1.4 - 7.7 K/uL  ? Lymphs Abs 0.3 (L) 0.7 - 4.0 K/uL  ? Monocytes Absolute 0.9 0.1 - 1.0 K/uL  ? Eosinophils Absolute 0.0 0.0 - 0.7 K/uL  ? Basophils Absolute 0.0 0.0 - 0.1 K/uL  ?Lipid panel     Status: None  ? Collection Time: 06/11/21  9:26 AM  ?Result Value Ref Range  ? Cholesterol 150 0 - 200 mg/dL  ?  Comment: ATP III Classification       Desirable:  < 200 mg/dL               Borderline High:  200 - 239 mg/dL          High:  > = 240 mg/dL  ? Triglycerides 55.0 0.0 - 149.0 mg/dL  ?  Comment: Normal:  <150 mg/dLBorderline High:  150 - 199 mg/dL  ? HDL 79.70 >39.00 mg/dL  ? VLDL 11.0 0.0 - 40.0 mg/dL  ? LDL Cholesterol 59 0 - 99 mg/dL  ? Total CHOL/HDL Ratio 2   ?  Comment:                Men          Women1/2 Average Risk     3.4          3.3Average Risk          5.0          4.42X Average Risk          9.6          7.13X Average Risk          15.0          11.0                      ? NonHDL 69.96   ?   Comment: NOTE:  Non-HDL goal should be 30 mg/dL higher than patient's LDL goal (i.e. LDL goal of < 70 mg/dL, would have non-HDL goal of < 100 mg/dL)  ?Comprehensive metabolic panel     Status: Abnormal  ? [place] Reasons

## 2021-06-14 NOTE — Patient Instructions (Addendum)
Bonine or meclizine (can help with nausea) chewable non drowsy ?Ginger gins gins hard or soft  ? ?Call for repeat bone density  ? ? ?Premier protein shakes  1 g sugar 30 grams of protein  ? ?These are over the counter medication options:  ?Mucinex dm green label for cough or robitussin DM  ?Multivitamin or below vitamins  ?Vitamin C 1000 mg daily.  ?Vitamin D3 4000 Iu (units) daily.  ?Zinc 100 mg daily.  ?Quercetin 250-500 mg 2 times per day   ?Elderberry  ?Oil of oregano  ?cepacol or chloroseptic spray ?Warm salt water gargles +hydrogen peroxide ?Sugar free cough drops  ?Warm tea with honey and lemon  ?Hydration  ?Try to eat though you dont feel like it   ?Tylenol or Advil  ?Nasal saline and Flonase 2 sprays nasal congestion  ?If sneezing/runny nose over the counter allergy pill claritin,allegra, zyrtec, xyzal ?Quarantine x 10-14 days 14 days preferred  ? ?Monitor pulse oximeter, buy from [facility] if oxygen is less than 90 please go to the hospital.  ?   ?   ?Are you feeling really sick? Shortness of breath, cough, chest pain?, dizziness? Confusion  ? If so let me know  ?If worsening, go to hospital or [facility] clinic Urgent care for further treatment.    ?

## 2021-06-14 NOTE — Telephone Encounter (Signed)
Lft pt vm to call ofc to sch CT on both numbers. Thank you! ?

## 2021-07-03 ENCOUNTER — Telehealth: Payer: Self-pay | Admitting: *Deleted

## 2021-07-03 NOTE — Telephone Encounter (Signed)
Lutricia Horsfall ? ?New Id :[id number] ? ?Old Id : ?DOB:[birth year] ? ?[address] [place], [place], [place], [address] ?

## 2021-07-03 NOTE — Telephone Encounter (Signed)
-----   Message from Delorise Jackson, MD sent at 06/14/2021  1:48 PM EDT ----- ?Pt wants prolia shots here  ?Last 11/07/20 endocrine kc ?

## 2021-07-09 NOTE — Telephone Encounter (Signed)
Walls, Carrissa ? ?New Id :[id number] ? ?Old Id : ?DOB:08/18/1950 ? ?[address] [place], [place], [address] ? ?[employer] [facility] ? Still pending. ?

## 2021-07-16 ENCOUNTER — Other Ambulatory Visit (INDEPENDENT_AMBULATORY_CARE_PROVIDER_SITE_OTHER): Payer: PPO

## 2021-07-16 DIAGNOSIS — D72821 Monocytosis (symptomatic): Secondary | ICD-10-CM

## 2021-07-16 LAB — CBC WITH DIFFERENTIAL/PLATELET
Basophils Absolute: 0 10*3/uL (ref 0.0–0.1)
Basophils Relative: 0.2 % (ref 0.0–3.0)
Eosinophils Absolute: 0.1 10*3/uL (ref 0.0–0.7)
Eosinophils Relative: 1.2 % (ref 0.0–5.0)
HCT: 39.7 % (ref 36.0–46.0)
Hemoglobin: 13.7 g/dL (ref 12.0–15.0)
Lymphocytes Relative: 8.8 % — ABNORMAL LOW (ref 12.0–46.0)
Lymphs Abs: 0.8 10*3/uL (ref 0.7–4.0)
MCHC: 34.5 g/dL (ref 30.0–36.0)
MCV: 98.3 fl (ref 78.0–100.0)
Monocytes Absolute: 0.8 10*3/uL (ref 0.1–1.0)
Monocytes Relative: 9.2 % (ref 3.0–12.0)
Neutro Abs: 7.3 10*3/uL (ref 1.4–7.7)
Neutrophils Relative %: 80.6 % — ABNORMAL HIGH (ref 43.0–77.0)
Platelets: 206 10*3/uL (ref 150.0–400.0)
RBC: 4.04 Mil/uL (ref 3.87–5.11)
RDW: 12.1 % (ref 11.5–15.5)
WBC: 9.1 10*3/uL (ref 4.0–10.5)

## 2021-07-16 NOTE — Telephone Encounter (Signed)
AS of 07/16/2021 still in process ?

## 2021-07-17 ENCOUNTER — Ambulatory Visit: Payer: PPO | Admitting: Internal Medicine

## 2021-07-17 LAB — PATHOLOGIST SMEAR REVIEW

## 2021-07-19 NOTE — Telephone Encounter (Signed)
Helen Snow, Helen Snow ?New patient still in process. ?New Id :[id number] ? ?Old Id : ?DOB:1950-05-15 ? ?[address] [place], [place], [address] ? ?[facility] ?Current case status ?BENEFIT VERIFICATION IN PROGRESS ?

## 2021-08-08 NOTE — Telephone Encounter (Signed)
Still stuck in verification process

## 2021-08-14 ENCOUNTER — Encounter: Payer: Self-pay | Admitting: *Deleted

## 2021-08-14 NOTE — Telephone Encounter (Signed)
Pt scheduled appt on 08/16/21

## 2021-08-14 NOTE — Telephone Encounter (Signed)
LMTCB & sent mychart message to schedule Prolia.  $0 due, no PA required

## 2021-08-16 ENCOUNTER — Ambulatory Visit (INDEPENDENT_AMBULATORY_CARE_PROVIDER_SITE_OTHER): Payer: PPO

## 2021-08-16 DIAGNOSIS — M810 Age-related osteoporosis without current pathological fracture: Secondary | ICD-10-CM | POA: Diagnosis not present

## 2021-08-16 MED ORDER — DENOSUMAB 60 MG/ML ~~LOC~~ SOSY
60.0000 mg | PREFILLED_SYRINGE | Freq: Once | SUBCUTANEOUS | Status: AC
  Administered 2021-08-16: 60 mg via SUBCUTANEOUS

## 2021-08-23 ENCOUNTER — Ambulatory Visit
Admission: RE | Admit: 2021-08-23 | Discharge: 2021-08-23 | Disposition: A | Discharge status: Home / Self Care | Payer: PPO | Source: Ambulatory Visit | Attending: Internal Medicine | Admitting: Internal Medicine

## 2021-08-23 DIAGNOSIS — Z780 Asymptomatic menopausal state: Secondary | ICD-10-CM | POA: Diagnosis not present

## 2021-08-23 DIAGNOSIS — M810 Age-related osteoporosis without current pathological fracture: Secondary | ICD-10-CM

## 2021-09-12 ENCOUNTER — Other Ambulatory Visit: Payer: Self-pay | Admitting: Internal Medicine

## 2021-09-12 DIAGNOSIS — F419 Anxiety disorder, unspecified: Secondary | ICD-10-CM

## 2021-09-14 ENCOUNTER — Ambulatory Visit (INDEPENDENT_AMBULATORY_CARE_PROVIDER_SITE_OTHER): Payer: PPO

## 2021-09-14 VITALS — Ht 65.5 in | Wt 134.0 lb

## 2021-09-14 DIAGNOSIS — Z0000 Encounter for general adult medical examination without abnormal findings: Secondary | ICD-10-CM

## 2021-09-14 NOTE — Patient Instructions (Addendum)
  Ms. Gossen , Thank you for taking time to come for your Medicare Wellness Visit. I appreciate your ongoing commitment to your health goals. Please review the following plan we discussed and let me know if I can assist you in the future.   These are the goals we discussed:  Goals      Follow up with Primary Care Provider     As needed.        This is a list of the screening recommended for you and due dates:  Health Maintenance  Topic Date Due   Flu Shot  10/09/2021   Mammogram  04/19/2023   Tetanus Vaccine  10/02/2027   Pneumonia Vaccine  Completed   DEXA scan (bone density measurement)  Completed   COVID-19 Vaccine  Completed   Hepatitis C Screening: USPSTF Recommendation to screen - Ages 65-79 yo.  Completed   HPV Vaccine  Aged Out   Colon Cancer Screening  Discontinued   Zoster (Shingles) Vaccine  Discontinued

## 2021-09-14 NOTE — Progress Notes (Signed)
Subjective:   Helen Snow is a 71 y.o. female who presents for Medicare Annual (Subsequent) preventive examination.  Review of Systems    No ROS.  Medicare Wellness Virtual Visit.  Visual/audio telehealth visit, UTA vital signs.   See social history for additional risk factors.   Cardiac Risk Factors include: advanced age (>80mn, >>2women)     Objective:    Today's Vitals   09/14/21 1039  Weight: 134 lb (60.8 kg)  Height: 5' 5.5" (1.664 m)   Body mass index is 21.96 kg/m.     09/14/2021   10:48 AM 01/26/2020    2:31 PM 01/26/2020    2:19 PM 01/18/2020    9:39 AM 11/11/2019   10:13 AM 02/26/2018    2:36 PM 08/20/2017    2:09 PM  Advanced Directives  Does Patient Have a Medical Advance Directive? Yes  Yes Yes Yes Yes Yes  Type of AParamedicof [place] will [place] will  Healthcare Power of [place] will Living will;Healthcare Power of [place] will  Does patient want to make changes to medical advance directive? No - Patient declined No - Patient declined  No - Patient declined No - Patient declined No - Patient declined   Copy of [place] Chart? Yes - validated most recent copy scanned in chart (See row information)   Yes - validated most recent copy scanned in chart (See row information) Yes - validated most recent copy scanned in chart (See row information) Yes - validated most recent copy scanned in chart (See row information)   Would patient like information on creating a medical advance directive? No - Patient declined          Current Medications (verified) Outpatient Encounter Medications as of 09/14/2021  Medication Sig   amLODipine (NORVASC) 5 MG tablet Take 1 tablet (5 mg total) by mouth in the morning and at bedtime. If BP >130/>80   amoxicillin (AMOXIL) 500 MG capsule Take 4 capsules (2,000 mg total) by  mouth once as needed for up to 1 dose. 1 hour with food before dental procedure due to hip replacement   amoxicillin-clavulanate (AUGMENTIN) 875-125 MG tablet Take 1 tablet by mouth 2 (two) times daily. With food   ascorbic acid (VITAMIN C) 500 MG tablet Take 500 mg by mouth 2 (two) times daily.   Calcium Carbonate-Vit D-Min (CALCIUM 1200 PO) Take 1 tablet by mouth daily.    clobetasol (TEMOVATE) 0.05 % external solution Apply 1 application topically daily as needed (itching).    denosumab (PROLIA) 60 MG/ML SOSY injection Inject into the skin every 6 (six) months.   diphenhydrAMINE (BENADRYL) 25 mg capsule Take 25 mg by mouth at bedtime as needed for sleep.   finasteride (PROSCAR) 5 MG tablet Take 5 mg by mouth daily.   ipratropium (ATROVENT) 0.06 % nasal spray Place 2 sprays into both nostrils in the morning.    levothyroxine (SYNTHROID) 75 MCG tablet Take 1 tablet (75 mcg total) by mouth daily before breakfast. 30 minutes before food. Do not take with vitamins wait 4 hours after medication before vitamins   lisinopril (ZESTRIL) 20 MG tablet Take 1 tablet (20 mg total) by mouth in the morning and at bedtime. If BP>130/>80 take a dose later in the day if BP <90/<60   LORazepam (ATIVAN) 1 MG tablet TAKE 1 TABLET (1 MG TOTAL) BY MOUTH 2 (TWO) TIMES DAILY AS  NEEDED FOR ANXIETY. D/C XANAX 0.5   Menatetrenone (VITAMIN K2) 100 MCG TABS Take 100 mcg by mouth daily.   minoxidil (LONITEN) 2.5 MG tablet Take 1.25 mg by mouth daily.   Multiple Vitamin (MULTIVITAMIN WITH MINERALS) TABS tablet Take 1 tablet by mouth daily.   pravastatin (PRAVACHOL) 40 MG tablet Take 1 tablet (40 mg total) by mouth at bedtime.   No facility-administered encounter medications on file as of 09/14/2021.    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 Tami Ribas    Hyperlipidemia    Hypertension    Hypothyroidism    Osteoporosis    Personal history of chemotherapy    Personal history of radiation therapy    Postmenopausal    Past Surgical History:  Procedure Laterality Date   left tonsillar surgery     02/26/16 invasive moderate SCC   TOTAL HIP ARTHROPLASTY Right 01/26/2020   Procedure: TOTAL HIP ARTHROPLASTY;  Surgeon: Dereck Leep, MD;  Location: [facility] ORS;  Service: Orthopedics;  Laterality: Right;   Family History  Problem Relation Age of Onset   Other Mother        legally blind   Hearing loss Mother    Heart disease Mother        MI   Hyperlipidemia Mother    Hypertension Mother    Kidney disease Mother    Cancer Father        lung smoker   Breast cancer Neg Hx    Social History   Socioeconomic History   Marital status: Widowed    Spouse name: Not on file   Number of children: Not on file   Years of education: Not on file   Highest education level: Not on file  Occupational History   Not on file  Tobacco Use   Smoking status: Never   Smokeless tobacco: Never  Vaping Use   Vaping Use: Never used  Substance and Sexual Activity   Alcohol use: Not Currently   Drug use: Never   Sexual activity: Not Currently  Other Topics Concern   Not on file  Social History Narrative   Widowed    No kids    No siblings    Never smoker   No pets   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[employer]    Social Determinants of Health   Financial Resource Strain: Low Risk  (09/14/2021)   Overall Financial Resource Strain (CARDIA)    Difficulty of Paying Living Expenses: Not hard at all  Food Insecurity: No Food Insecurity (09/14/2021)   Hunger Vital Sign    Worried About Running Out of Food in the Last Year: Never true    [place] in the Last Year: Never true  Transportation Needs: No Transportation Needs (09/14/2021)   PRAPARE - Hydrologist  (Medical): No    Lack of Transportation (Non-Medical): No  Physical Activity: Not on file  Stress: No Stress Concern Present (09/14/2021)   [place]    Feeling of Stress : Not at all  Social Connections: Unknown (09/14/2021)   Social Connection and Isolation Panel [NHANES]    Frequency of Communication with  Friends and Family: More than three times a week    Frequency of Social Gatherings with Friends and Family: More than three times a week    Attends Religious Services: Not on file    Active Member of Clubs or Organizations: Not on file    Attends Archivist Meetings: Not on file    Marital Status: Widowed   Tobacco Counseling Counseling given: Not Answered  Clinical Intake: Pre-visit preparation completed: Yes        Diabetes: No  How often do you need to have someone help you when you read instructions, pamphlets, or other written materials from your doctor or pharmacy?: 1 - Never  Interpreter Needed?: No    Activities of Daily Living    09/14/2021   10:49 AM  In your present state of health, do you have any difficulty performing the following activities:  Hearing? 0  Vision? 0  Difficulty concentrating or making decisions? 0  Walking or climbing stairs? 0  Dressing or bathing? 0  Doing errands, shopping? 0  Preparing Food and eating ? N  Using the Toilet? N  In the past six months, have you accidently leaked urine? N  Do you have problems with loss of bowel control? N  Managing your Medications? N  Managing your Finances? N  Housekeeping or managing your Housekeeping? N   Patient Care Team: McLean-Scocuzza, Nino Glow, MD as PCP - General (Internal Medicine) Lloyd Huger, MD as Consulting Physician (Oncology) Noreene Filbert, MD as Referring Physician (Radiation Oncology)  Indicate any recent Medical Services you may have received from other than [facility] providers in the past year  (date may be approximate).     Assessment:   This is a routine wellness examination for Karia.  Virtual Visit via Telephone Note  I connected with  Tawn Fitzner Orndoff on 09/14/21 at 10:30 AM EDT by telephone and verified that I am speaking with the correct person using two identifiers.  Persons participating in the virtual visit: patient/Nurse Health Advisor   I discussed the limitations of performing an evaluation and management service by telehealth. We continued and completed visit with audio only. Some vital signs may be absent or patient reported.   Hearing/Vision screen Hearing Screening - Comments:: Patient is able to hear conversational tones without difficulty. No issues reported.  Vision Screening - Comments:: Follow[facility]  Wears corrective lenses They have seen their ophthalmologist in the last 12 months.   Dietary issues and exercise activities discussed: Current Exercise Habits: Home exercise routine, Intensity: Mild Healthy diet   Goals Addressed             This Visit's Progress    Follow up with Primary Care Provider       As needed.       Depression Screen    09/14/2021   10:44 AM 01/17/2021    2:03 PM 11/11/2019   10:11 AM 06/17/2019    1:36 PM 12/17/2018    2:52 PM 05/29/2017    3:20 PM  PHQ 2/9 Scores  PHQ - 2 Score 0 4 0 1 0 0  PHQ- 9 Score  10        Fall Risk    09/14/2021   10:38 AM 01/17/2021    1:16 PM 07/13/2020    2:27 PM 11/11/2019   10:14 AM 06/17/2019    1:36 PM  Fall Risk   Falls in the past year? 0 0 1 1 0  Number  falls in past yr:  0 0 0 0  Comment    She lost her balance and caught herself. Cane in use.   Injury with Fall?  0 1 0 0  Risk for fall due to :  No Fall Risks History of fall(s)    Follow up Falls evaluation completed Falls evaluation completed Falls evaluation completed Falls evaluation completed Falls evaluation completed   [place]: Home free of loose throw rugs in  walkways, pet beds, electrical cords, etc? Yes  Adequate lighting in your home to reduce risk of falls? Yes   ASSISTIVE DEVICES UTILIZED TO PREVENT FALLS: Life alert? Yes  Use of a cane, walker or w/c? No   TIMED UP AND GO: Was the test performed? No .   Cognitive Function:  Patient is alert and oriented x3.      Immunizations Immunization History  Administered Date(s) Administered   Influenza, High Dose Seasonal PF 12/25/2016, 01/01/2018, 12/29/2018, 12/31/2019, 01/03/2021   Influenza-Unspecified 12/23/2013, 12/22/2014, 12/29/2015, 12/25/2016, 12/29/2018   PFIZER Comirnaty(Gray Top)Covid-19 Tri-Sucrose Vaccine 05/09/2020   PFIZER(Purple Top)SARS-COV-2 Vaccination 06/04/2019, 06/29/2019   Pfizer Covid-19 Vaccine Bivalent Booster 26yr & up 11/28/2020   Pneumococcal Conjugate-13 12/22/2018, 01/20/2019   Pneumococcal Polysaccharide-23 06/26/2017   Tdap 10/01/2017   Screening Tests Health Maintenance  Topic Date Due   INFLUENZA VACCINE  10/09/2021   MAMMOGRAM  04/19/2023   TETANUS/TDAP  10/02/2027   Pneumonia Vaccine 70 Years old  Completed   DEXA SCAN  Completed   COVID-19 Vaccine  Completed   Hepatitis C Screening  Completed   HPV VACCINES  Aged Out   COLONOSCOPY (Pts 45-443yrInsurance coverage will need to be confirmed)  Discontinued   Zoster Vaccines- Shingrix  Discontinued   Health Maintenance There are no preventive care reminders to display for this patient.  Colonoscopy- declined per patient.   Lung Cancer Screening: (Low Dose CT Chest recommended if Age 70-80ears, 30 pack-year currently smoking OR have quit w/in 15years.) does not qualify.   Vision Screening: Recommended annual ophthalmology exams for early detection of glaucoma and other disorders of the eye.  Dental Screening: Recommended annual dental exams for proper oral hygiene  Community Resource Referral / Chronic Care Management: CRR required this visit?  No   CCM required this visit?  No       Plan:   Keep all routine maintenance appointments.   I have personally reviewed and noted the following in the patient's chart:   Medical and social history Use of alcohol, tobacco or illicit drugs  Current medications and supplements including opioid prescriptions.  Functional ability and status Nutritional status Physical activity Advanced directives List of other physicians Hospitalizations, surgeries, and ER visits in previous 12 months Vitals Screenings to include cognitive, depression, and falls Referrals and appointments  In addition, I have reviewed and discussed with patient certain preventive protocols, quality metrics, and best practice recommendations. A written personalized care plan for preventive services as well as general preventive health recommendations were provided to patient.     OBVarney BilesLPN   7/07/15/3891

## 2021-11-01 ENCOUNTER — Telehealth: Payer: Self-pay | Admitting: Internal Medicine

## 2021-11-01 NOTE — Telephone Encounter (Signed)
Pt called in stating the LORazepam '1mg'$  is on back order at her pharmacy but they do have the .5  and pt want to know if the provider can send in a prescription for that mg sent to [address]

## 2021-11-05 ENCOUNTER — Other Ambulatory Visit: Payer: Self-pay | Admitting: Internal Medicine

## 2021-11-05 DIAGNOSIS — F419 Anxiety disorder, unspecified: Secondary | ICD-10-CM

## 2021-11-05 MED ORDER — LORAZEPAM 0.5 MG PO TABS: 1.0000 mg | ORAL_TABLET | Freq: Two times a day (BID) | ORAL | 0 refills | Status: DC | PRN

## 2021-11-05 NOTE — Telephone Encounter (Signed)
Sent 0.5 mg can take 1-2 bid prn but left 1 mg bid prn ativan for when no longer on backorder

## 2021-11-07 DIAGNOSIS — L661 Lichen planopilaris: Secondary | ICD-10-CM | POA: Diagnosis not present

## 2021-11-07 DIAGNOSIS — L538 Other specified erythematous conditions: Secondary | ICD-10-CM | POA: Diagnosis not present

## 2021-12-07 ENCOUNTER — Telehealth: Payer: Self-pay | Admitting: Internal Medicine

## 2021-12-07 NOTE — Telephone Encounter (Signed)
Re ct ab/pelvis w/o contrast  ----- Message ----- From: Ashley Jacobs Sent: 12/07/2021   7:24 AM EDT To: Nino Glow McLean-Scocuzza, MD Subject: CT                                              Good morning!   Please see previous msg. Thank you!   . Type Date User Summary Attachment  General 08/08/2021  7:08 AM [place] Hun: Referral message -  Note:   ----- Message ----- From: Ashley Jacobs Sent: 08/08/2021   7:08 AM EDT To: Nino Glow McLean-Scocuzza, MD Subject: [place]                                              Good morning!   Happy Wednesday!   On 06/21/2021 Pt states she will hold off on this right now. Pt stated it was her nerves. She will call back to sch.   Thank you!    Dr. Olivia Mackie McLean-Scocuzza

## 2021-12-14 ENCOUNTER — Encounter: Payer: Self-pay | Admitting: Internal Medicine

## 2021-12-14 ENCOUNTER — Telehealth: Payer: Self-pay | Admitting: Internal Medicine

## 2021-12-14 ENCOUNTER — Ambulatory Visit (INDEPENDENT_AMBULATORY_CARE_PROVIDER_SITE_OTHER): Payer: PPO | Admitting: Internal Medicine

## 2021-12-14 VITALS — BP 120/62 | HR 70 | Temp 98.4°F | Ht 65.5 in | Wt 139.0 lb

## 2021-12-14 DIAGNOSIS — M5459 Other low back pain: Secondary | ICD-10-CM | POA: Diagnosis not present

## 2021-12-14 DIAGNOSIS — F32A Depression, unspecified: Secondary | ICD-10-CM | POA: Diagnosis not present

## 2021-12-14 DIAGNOSIS — I6521 Occlusion and stenosis of right carotid artery: Secondary | ICD-10-CM

## 2021-12-14 DIAGNOSIS — Z1211 Encounter for screening for malignant neoplasm of colon: Secondary | ICD-10-CM | POA: Diagnosis not present

## 2021-12-14 DIAGNOSIS — E039 Hypothyroidism, unspecified: Secondary | ICD-10-CM | POA: Diagnosis not present

## 2021-12-14 DIAGNOSIS — F439 Reaction to severe stress, unspecified: Secondary | ICD-10-CM

## 2021-12-14 DIAGNOSIS — F419 Anxiety disorder, unspecified: Secondary | ICD-10-CM

## 2021-12-14 DIAGNOSIS — R011 Cardiac murmur, unspecified: Secondary | ICD-10-CM

## 2021-12-14 DIAGNOSIS — I10 Essential (primary) hypertension: Secondary | ICD-10-CM

## 2021-12-14 DIAGNOSIS — Z1231 Encounter for screening mammogram for malignant neoplasm of breast: Secondary | ICD-10-CM

## 2021-12-14 DIAGNOSIS — Z9889 Other specified postprocedural states: Secondary | ICD-10-CM

## 2021-12-14 DIAGNOSIS — M1612 Unilateral primary osteoarthritis, left hip: Secondary | ICD-10-CM

## 2021-12-14 LAB — CBC WITH DIFFERENTIAL/PLATELET
Basophils Absolute: 0 10*3/uL (ref 0.0–0.1)
Basophils Relative: 0.3 % (ref 0.0–3.0)
Eosinophils Absolute: 0.1 10*3/uL (ref 0.0–0.7)
Eosinophils Relative: 1.9 % (ref 0.0–5.0)
HCT: 36.9 % (ref 36.0–46.0)
Hemoglobin: 12.7 g/dL (ref 12.0–15.0)
Lymphocytes Relative: 13.5 % (ref 12.0–46.0)
Lymphs Abs: 0.9 10*3/uL (ref 0.7–4.0)
MCHC: 34.5 g/dL (ref 30.0–36.0)
MCV: 93.9 fl (ref 78.0–100.0)
Monocytes Absolute: 0.6 10*3/uL (ref 0.1–1.0)
Monocytes Relative: 9.2 % (ref 3.0–12.0)
Neutro Abs: 5.2 10*3/uL (ref 1.4–7.7)
Neutrophils Relative %: 75.1 % (ref 43.0–77.0)
Platelets: 210 10*3/uL (ref 150.0–400.0)
RBC: 3.92 Mil/uL (ref 3.87–5.11)
RDW: 11.9 % (ref 11.5–15.5)
WBC: 7 10*3/uL (ref 4.0–10.5)

## 2021-12-14 LAB — COMPREHENSIVE METABOLIC PANEL
ALT: 20 U/L (ref 0–35)
AST: 24 U/L (ref 0–37)
Albumin: 4.3 g/dL (ref 3.5–5.2)
Alkaline Phosphatase: 43 U/L (ref 39–117)
BUN: 21 mg/dL (ref 6–23)
CO2: 28 mEq/L (ref 19–32)
Calcium: 9.2 mg/dL (ref 8.4–10.5)
Chloride: 99 mEq/L (ref 96–112)
Creatinine, Ser: 0.71 mg/dL (ref 0.40–1.20)
GFR: 86.15 mL/min (ref >60.00)
Glucose, Bld: 96 mg/dL (ref 70–99)
Potassium: 4 mEq/L (ref 3.5–5.1)
Sodium: 135 mEq/L (ref 135–145)
Total Bilirubin: 0.5 mg/dL (ref 0.2–1.2)
Total Protein: 6.4 g/dL (ref 6.0–8.3)

## 2021-12-14 LAB — TSH: TSH: 1.56 u[IU]/mL (ref 0.35–5.50)

## 2021-12-14 MED ORDER — AMOXICILLIN 500 MG PO CAPS: 2000.0000 mg | ORAL_CAPSULE | Freq: Once | ORAL | 0 refills | Status: DC | PRN

## 2021-12-14 MED ORDER — CITALOPRAM HYDROBROMIDE 10 MG PO TABS: 10.0000 mg | ORAL_TABLET | Freq: Every day | ORAL | 3 refills | Status: DC

## 2021-12-14 MED ORDER — AMLODIPINE BESYLATE 5 MG PO TABS: 5.0000 mg | ORAL_TABLET | Freq: Two times a day (BID) | ORAL | 3 refills | Status: DC

## 2021-12-14 MED ORDER — PRAVASTATIN SODIUM 40 MG PO TABS: 40.0000 mg | ORAL_TABLET | Freq: Every day | ORAL | 3 refills | Status: DC

## 2021-12-14 MED ORDER — IPRATROPIUM BROMIDE 0.06 % NA SOLN: 2.0000 | Freq: Every morning | NASAL | 11 refills | Status: DC

## 2021-12-14 MED ORDER — LORAZEPAM 1 MG PO TABS: ORAL_TABLET | ORAL | 2 refills | Status: DC

## 2021-12-14 MED ORDER — LEVOTHYROXINE SODIUM 75 MCG PO TABS: 75.0000 ug | ORAL_TABLET | Freq: Every day | ORAL | 3 refills | Status: DC

## 2021-12-14 MED ORDER — LISINOPRIL 10 MG PO TABS: 10.0000 mg | ORAL_TABLET | Freq: Two times a day (BID) | ORAL | 3 refills | Status: DC

## 2021-12-14 NOTE — Patient Instructions (Addendum)
[facility]  [place] 3.8 9 Google reviews Counselor in [place], [place] Address: [address], [place], [place] [address] Hours:  Open ? Closes 5?PM Phone: [phone]   [facility]  Location [address],  [place], [place] [address]  Hours By Appointment Only  Contact Tel: [phone]  Fax: [phone]  email: [email or web address]  Copyright  December 14, 2021  [facility]  All Rights Reserved  Citalopram Tablets What is this medication? CITALOPRAM (sye TAL oh pram) treats depression. It increases the amount of serotonin in the brain, a hormone that helps regulate mood. It belongs to a group of medications called SSRIs. This medicine may be used for other purposes; ask your health care provider or pharmacist if you have questions. COMMON BRAND NAME(S): Celexa What should I tell my care team before I take this medication? They need to know if you have any of these conditions: Bipolar disorder or a family history of bipolar disorder Bleeding disorders Glaucoma Heart disease History of irregular heartbeat Kidney disease Liver disease Low levels of magnesium or potassium in the blood Receiving electroconvulsive therapy Seizures Suicidal thoughts, plans, or attempt; a previous suicide attempt by you or a family member Take medications that treat or prevent blood clots Thyroid disease An unusual or allergic reaction to citalopram, escitalopram, other medications, foods, dyes, or preservatives Pregnant or trying to become pregnant Breast-feeding How should I use this medication? Take this medication by mouth with a glass of water. Follow the directions on the prescription label. You can take it with or without food. Take your medication at regular intervals. Do not take your medication more often than directed. Do not stop taking this medication suddenly except upon the advice of your care team. Stopping this  medication too quickly may cause serious side effects or your condition may worsen. A special MedGuide will be given to you by the pharmacist with each prescription and refill. Be sure to read this information carefully each time. Talk to your care team about the use of this medication in children. Special care may be needed. Patients over 11 years old may have a stronger reaction and need a smaller dose. Overdosage: If you think you have taken too much of this medicine contact a poison control center or emergency room at once. NOTE: This medicine is only for you. Do not share this medicine with others. What if I miss a dose? If you miss a dose, take it as soon as you can. If it is almost time for your next dose, take only that dose. Do not take double or extra doses. What may interact with this medication? Do not take this medication with any of the following: Certain medications for fungal infections like fluconazole, itraconazole, ketoconazole, posaconazole, voriconazole Cisapride Dronedarone Escitalopram Linezolid MAOIs like Carbex, Eldepryl, Marplan, Nardil, and Parnate Methylene blue (injected into a vein) Pimozide Thioridazine This medication may also interact with the following: Alcohol Amphetamines Aspirin and aspirin-like medications Carbamazepine Certain medications for depression, anxiety, or psychotic disturbances Certain medications for infections like chloroquine, clarithromycin, erythromycin, furazolidone, isoniazid, pentamidine Certain medications for migraine headaches like almotriptan, eletriptan, frovatriptan, naratriptan, rizatriptan, sumatriptan, zolmitriptan Certain medications for sleep Certain medications that treat or prevent blood clots like dalteparin, enoxaparin, warfarin Cimetidine Diuretics Dofetilide Fentanyl Lithium Methadone Metoprolol NSAIDs, medications for pain and inflammation, like ibuprofen or naproxen Omeprazole Other medications that  prolong the QT interval (cause an abnormal heart rhythm) Procarbazine Rasagiline Supplements like St. John's wort,  kava kava, valerian Tramadol Tryptophan Ziprasidone This list may not describe all possible interactions. Give your health care provider a list of all the medicines, herbs, non-prescription drugs, or dietary supplements you use. Also tell them if you smoke, drink alcohol, or use illegal drugs. Some items may interact with your medicine. What should I watch for while using this medication? Tell your care team if your symptoms do not get better or if they get worse. Visit your care team for regular checks on your progress. Because it may take several weeks to see the full effects of this medication, it is important to continue your treatment as prescribed. Watch for new or worsening thoughts of suicide or depression. This includes sudden changes in mood, behavior, or thoughts. These changes can happen at any time but are more common in the beginning of treatment or after a change in dose. Call your care team right away if you experience these thoughts or worsening depression. Manic episodes may happen in patients with bipolar disorder who take this medication. Watch for changes in feelings or behaviors such as feeling anxious, nervous, agitated, panicky, irritable, hostile, aggressive, impulsive, severely restless, overly excited and hyperactive, or trouble sleeping. These symptoms can happen at anytime but are more common in the beginning of treatment or after a change in dose. Call you care team right away if you notice any of these symptoms. You may get drowsy or dizzy. Do not drive, use machinery, or do anything that needs mental alertness until you know how this medication affects you. Do not stand or sit up quickly, especially if you are an older patient. This reduces the risk of dizzy or fainting spells. Alcohol may interfere with the effect of this medication. Avoid alcoholic  drinks. Your mouth may get dry. Chewing sugarless gum or sucking hard candy, and drinking plenty of water may help. Contact your care team if the problem does not go away or is severe. What side effects may I notice from receiving this medication? Side effects that you should report to your care team as soon as possible: Allergic reactions--skin rash, itching, hives, swelling of the face, lips, tongue, or throat Bleeding--bloody or black, tar-like stools, red or dark brown urine, vomiting blood or brown material that looks like coffee grounds, small, red or purple spots on skin, unusual bleeding or bruising Heart rhythm changes--fast or irregular heartbeat, dizziness, feeling faint or lightheaded, chest pain, trouble breathing Low sodium level--muscle weakness, fatigue, dizziness, headache, confusion Serotonin syndrome--irritability, confusion, fast or irregular heartbeat, muscle stiffness, twitching muscles, sweating, high fever, seizure, chills, vomiting, diarrhea Sudden eye pain or change in vision such as blurry vision, seeing halos around lights, vision loss Thoughts of suicide or self-harm, worsening mood, feelings of depression Side effects that usually do not require medical attention (report to your care team if they continue or are bothersome): Change in sex drive or performance Diarrhea Dry mouth Excessive sweating Nausea Tremors or shaking Upset stomach This list may not describe all possible side effects. Call your doctor for medical advice about side effects. You may report side effects to FDA at 1-800-FDA-1088. Where should I keep my medication? Keep out of reach of children and pets. Store at room temperature between 15 and 30 degrees C (59 and 86 degrees F). Throw away any unused medication after the expiration date. NOTE: This sheet is a summary. It may not cover all possible information. If you have questions about this medicine, talk to your doctor, pharmacist, or health  care provider.  2023 Elsevier/Gold Standard (2007-04-18 00:00:00)

## 2021-12-14 NOTE — Progress Notes (Addendum)
Chief Complaint  Patient presents with   Follow-up    6 month f/u   F/u  1. Anxiety/depression phq 9 score 6 and gad 7 score 8 wants referral to therapy  Stress a job she wanted to resume part time is no longer and also living at [facility] at [place] and a lot of people who live there are older than her she has no family so this was only choice as she is a widow as well. Prior neighborhood she had issues with her next door neighbor making too much noise which is why she decided to move to the village at [place]   2. Mild to moderate low back pain and left hip pain xray in 2021 + arthritis  Right rotator cuff and numbness in knee  3. Htn controlled only taking norvasc 5 mg qd to bid and lis 20 mg bid but not taking lis 20 mg bid due to BP running low will cute dose of lisinopril in 1/2    Review of Systems  Constitutional:  Negative for weight loss.  HENT:  Negative for hearing loss.   Eyes:  Negative for blurred vision.  Respiratory:  Negative for shortness of breath.   Cardiovascular:  Negative for chest pain.  Gastrointestinal:  Negative for abdominal pain and blood in stool.  Genitourinary:  Negative for dysuria.  Musculoskeletal:  Positive for back pain, joint pain and neck pain. Negative for falls.  Skin:  Negative for rash.  Neurological:  Negative for headaches.  Psychiatric/Behavioral:  Positive for depression. The patient is nervous/anxious.    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 Tami Ribas    Hyperlipidemia    Hypertension    Hypothyroidism    Osteoporosis    Personal history of chemotherapy    Personal history of radiation therapy    Postmenopausal    Right shoulder pain    right tendonitis   Past Surgical History:  Procedure Laterality Date   left tonsillar surgery     02/26/16 invasive moderate SCC   TOTAL HIP  ARTHROPLASTY Right 01/26/2020   Procedure: TOTAL HIP ARTHROPLASTY;  Surgeon: Dereck Leep, MD;  Location: [facility] ORS;  Service: Orthopedics;  Laterality: Right;   Family History  Problem Relation Age of Onset   Other Mother        legally blind   Hearing loss Mother    Heart disease Mother        MI   Hyperlipidemia Mother    Hypertension Mother    Kidney disease Mother    Cancer Father        lung smoker   Breast cancer Neg Hx    Social History   Socioeconomic History   Marital status: Widowed    Spouse name: Not on file   Number of children: Not on file   Years of education: Not on file   Highest education level: Not on file  Occupational History   Not on file  Tobacco Use   Smoking status: Never   Smokeless tobacco: Never  Vaping Use   Vaping Use: Never used  Substance and Sexual Activity   Alcohol use: Not Currently   Drug use: Never   Sexual activity: Not Currently  Other Topics Concern   Not on file  Social History Narrative  Widowed    No kids    No siblings    Never smoker   No pets   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 [employer] Strain: Low Risk  (09/14/2021)   Overall Financial Resource Strain (CARDIA)    Difficulty of Paying Living Expenses: Not hard at all  Food Insecurity: No Food Insecurity (09/14/2021)   Hunger Vital Sign    Worried About Running Out of Food in the Last Year: Never true    Ran Out of Food in the Last Year: Never true  Transportation Needs: No Transportation Needs (09/14/2021)   PRAPARE - Hydrologist (Medical): No    Lack of Transportation (Non-Medical): No  Physical Activity: Not on file  Stress: No Stress Concern Present (09/14/2021)   [place]    Feeling of Stress : Not at all  Social Connections: Unknown (09/14/2021)    Social Connection and Isolation Panel [NHANES]    Frequency of Communication with Friends and Family: More than three times a week    Frequency of Social Gatherings with Friends and Family: More than three times a week    Attends Religious Services: Not on file    Active Member of Clubs or Organizations: Not on file    Attends Archivist Meetings: Not on file    Marital Status: Widowed  Intimate Partner Violence: Not At Risk (09/14/2021)   Humiliation, Afraid, Rape, and Kick questionnaire    Fear of Current or Ex-Partner: No    Emotionally Abused: No    Physically Abused: No    Sexually Abused: No   Current Meds  Medication Sig   ascorbic acid (VITAMIN C) 500 MG tablet Take 500 mg by mouth 2 (two) times daily.   Calcium Carbonate-Vit D-Min (CALCIUM 1200 PO) Take 1 tablet by mouth daily.    citalopram (CELEXA) 10 MG tablet Take 1 tablet (10 mg total) by mouth daily.   denosumab (PROLIA) 60 MG/ML SOSY injection Inject into the skin every 6 (six) months.   finasteride (PROSCAR) 5 MG tablet Take 5 mg by mouth daily.   Menatetrenone (VITAMIN K2) 100 MCG TABS Take 100 mcg by mouth daily.   minoxidil (LONITEN) 2.5 MG tablet Take 1.25 mg by mouth daily.   Multiple Vitamin (MULTIVITAMIN WITH MINERALS) TABS tablet Take 1 tablet by mouth daily.   [DISCONTINUED] amLODipine (NORVASC) 5 MG tablet Take 1 tablet (5 mg total) by mouth in the morning and at bedtime. If BP >130/>80   [DISCONTINUED] amoxicillin (AMOXIL) 500 MG capsule Take 4 capsules (2,000 mg total) by mouth once as needed for up to 1 dose. 1 hour with food before dental procedure due to hip replacement   [DISCONTINUED] ipratropium (ATROVENT) 0.06 % nasal spray Place 2 sprays into both nostrils in the morning.    [DISCONTINUED] levothyroxine (SYNTHROID) 75 MCG tablet Take 1 tablet (75 mcg total) by mouth daily before breakfast. 30 minutes before food. Do not take with vitamins wait 4 hours after medication before vitamins    [DISCONTINUED] lisinopril (ZESTRIL) 20 MG tablet Take 1 tablet (20 mg total) by mouth in the morning and at bedtime. If BP>130/>80 take a dose later in the day if BP <90/<60   [DISCONTINUED] LORazepam (ATIVAN) 0.5 MG tablet Take 2 tablets (1 mg total) by  mouth 2 (two) times daily as needed for anxiety. 1 mg on backorder   [DISCONTINUED] LORazepam (ATIVAN) 1 MG tablet TAKE 1 TABLET (1 MG TOTAL) BY MOUTH 2 (TWO) TIMES DAILY AS NEEDED FOR ANXIETY. D/C XANAX 0.5   [DISCONTINUED] pravastatin (PRAVACHOL) 40 MG tablet Take 1 tablet (40 mg total) by mouth at bedtime.   Allergies  Allergen Reactions   Pioglitazone Swelling    Fluid retention   No results found for this or any previous visit (from the past 2160 hour(s)). Objective  Body mass index is 22.78 kg/m. Wt Readings from Last 3 Encounters:  12/14/21 139 lb (63 kg)  09/14/21 134 lb (60.8 kg)  06/14/21 134 lb 9.6 oz (61.1 kg)   Temp Readings from Last 3 Encounters:  12/14/21 98.4 F (36.9 C) (Oral)  06/14/21 98.7 F (37.1 C) (Oral)  01/17/21 98.7 F (37.1 C) (Oral)   BP Readings from Last 3 Encounters:  12/14/21 120/62  06/14/21 110/60  01/17/21 114/76   Pulse Readings from Last 3 Encounters:  12/14/21 70  06/14/21 78  01/17/21 65    Physical Exam Vitals and nursing note reviewed.  Constitutional:      Appearance: Normal appearance. She is well-developed and well-groomed.  HENT:     Head: Normocephalic and atraumatic.  Eyes:     Conjunctiva/sclera: Conjunctivae normal.     Pupils: Pupils are equal, round, and reactive to light.  Cardiovascular:     Rate and Rhythm: Normal rate and regular rhythm.     Heart sounds: Murmur heard.  Pulmonary:     Effort: Pulmonary effort is normal.     Breath sounds: Normal breath sounds.  Abdominal:     General: Abdomen is flat. Bowel sounds are normal.     Tenderness: There is no abdominal tenderness.  Musculoskeletal:        General: No tenderness.  Skin:    General: Skin is  warm and dry.  Neurological:     General: No focal deficit present.     Mental Status: She is alert and oriented to person, place, and time. Mental status is at baseline.     Cranial Nerves: Cranial nerves 2-12 are intact.     Motor: Motor function is intact.     Coordination: Coordination is intact.     Gait: Gait is intact.  Psychiatric:        Attention and Perception: Attention and perception normal.        Mood and Affect: Mood and affect normal.        Speech: Speech normal.        Behavior: Behavior normal. Behavior is cooperative.        Thought Content: Thought content normal.        Cognition and Memory: Cognition and memory normal.        Judgment: Judgment normal.     Assessment  Plan  Anxiety and depression - Plan: citalopram (CELEXA) 10 MG tablet Will refer to therapy pt to call back with a name  Consider referral in future [facility] psych or Dr. Nicolasa Ducking both taker her insurance as far as psychiatry  Heart murmur Declines echo for now   Essential hypertension - Plan: lisinopril (ZESTRIL) 10 MG tablet bid prn will reduce dose from 20 mg bid amLODipine (NORVASC) 5 MG tablet bid, Comprehensive metabolic panel, CBC with Differential/Platelet  Hypothyroidism, unspecified type - Plan: levothyroxine (SYNTHROID) 75 MCG tablet, TSH  Anxiety - Plan: LORazepam (ATIVAN) 1 MG tablet bid prn  Stenosis of right carotid artery - Plan: pravastatin (PRAVACHOL) 40 MG tablet, DISCONTINUED: pravastatin (PRAVACHOL) 40 MG tablet  History of recent dental procedure - Plan: amoxicillin (AMOXIL) 500 MG capsule  Arthralgia of lumbar spine - Plan: Ambulatory referral to Physical Medicine Rehab Dr. Sharlet Salina  Arthritis of left hip - Plan: Ambulatory referral to Physical Medicine Rehab   HM Flu shot utd given 12/2021 end +covid booster  Had pna 23  utd prevnar  Consider prevnar 20 in 01/2024   Wants to wait on shingrix as of the past Tdap had 10/01/17  covid vx had 3/3 consider 4th dose pt  unsure as of 07/13/20 no h/o covid 44    Never had colonoscopy declines again  Fit test 11/07/20 home test    07/13/20 disc cologuard today wanted to wait 07/13/20  Never had colonoscopy  Will readdress at f/u cologuard vs colonoscopy though colonoscopy best    Out of age window pap last 07/24/17 neg pap neg HPV no h/o abnormal pap    Mammogram 04/18/21 neg ordered 2024    dexa 01/02/17 +osteoporosis on fosamax 06/2018 and for 3 years  -->referred back [facility] endocrine appt 07/15/19 as of 06/2021 they did prolia injection and she wants to do them with our office instead  Rec healthy diet and exercise Provider: Dr. Olivia Mackie McLean-Scocuzza-Internal Medicine

## 2021-12-14 NOTE — Telephone Encounter (Signed)
Patient saw Dr Aundra Dubin today and a referral is being put in for psychologie. Health Team will cover Allayne Butcher at [facility].

## 2021-12-17 NOTE — Addendum Note (Signed)
Addended by: Orland Mustard on: 12/17/2021 09:06 PM   Modules accepted: Orders

## 2021-12-17 NOTE — Telephone Encounter (Signed)
Referral placed.

## 2021-12-19 ENCOUNTER — Telehealth: Payer: Self-pay

## 2021-12-19 NOTE — Telephone Encounter (Signed)
Patient states she took her first citalopram (CELEXA) 10 MG tablet on Saturday night (12/15/2021).  Patient states she has taken it as prescribed, but last night she woke up in a sweat and she didn't feel good.  Patient states she felt light headed when she got up to get some water.  Patient states it took around 2-3 hours to get back to normal.  Patient states she would like to know if she should continue to take it.  Patient states she has been taking this medication on an empty stomach and is wondering if it would help to have something to eat when she takes it.  Patient states her blood pressure was 120/60 this morning.

## 2021-12-19 NOTE — Telephone Encounter (Signed)
Try the medication with food please

## 2021-12-19 NOTE — Telephone Encounter (Signed)
Pt has been informed.

## 2021-12-20 ENCOUNTER — Other Ambulatory Visit: Payer: Self-pay | Admitting: Internal Medicine

## 2021-12-20 DIAGNOSIS — I10 Essential (primary) hypertension: Secondary | ICD-10-CM

## 2021-12-20 MED ORDER — LISINOPRIL 20 MG PO TABS: 20.0000 mg | ORAL_TABLET | Freq: Two times a day (BID) | ORAL | 3 refills | Status: DC

## 2021-12-20 NOTE — Telephone Encounter (Signed)
Ok to stop celexa  Add back lisinopril 20 mg qd to bid instead of 10 mg  Monitor blood pressure  F/u with therapy

## 2021-12-20 NOTE — Telephone Encounter (Signed)
Pt has been informed.

## 2021-12-20 NOTE — Telephone Encounter (Signed)
Patient states she did not take the citalopram (CELEXA) 10 MG tablet last night because she needed some sleep.  Patient states she took the medication this morning with food but she did not take the LORazepam (ATIVAN) 1 MG tablet.  Patient states she has been experiencing jitteriness, prickly skin, feet sweating, stomach feels unsettled, and her blood pressure has increased.  Patient states the last time she took her blood pressure, it was 149/72, which is not normal for her.  Patient states she has been taking her blood pressure medication like she usually does, original strength before Dr. Olivia Mackie McLean-Scocuzza lowered it.  Patient states she has a telephone visit scheduled with a psychologist, Conception Chancy, PsyD, on 12/26/2021.  Patient states she is planning to discontinue the citalopram (CELEXA) 10 MG tablet for now.  Patient states she would rather be depressed than to have these symptoms.

## 2021-12-24 DIAGNOSIS — M79605 Pain in left leg: Secondary | ICD-10-CM | POA: Diagnosis not present

## 2021-12-24 DIAGNOSIS — M5136 Other intervertebral disc degeneration, lumbar region: Secondary | ICD-10-CM | POA: Diagnosis not present

## 2021-12-24 DIAGNOSIS — M545 Low back pain, unspecified: Secondary | ICD-10-CM | POA: Diagnosis not present

## 2021-12-24 DIAGNOSIS — M5416 Radiculopathy, lumbar region: Secondary | ICD-10-CM | POA: Diagnosis not present

## 2021-12-25 DIAGNOSIS — Z1211 Encounter for screening for malignant neoplasm of colon: Secondary | ICD-10-CM | POA: Diagnosis not present

## 2021-12-26 ENCOUNTER — Ambulatory Visit (INDEPENDENT_AMBULATORY_CARE_PROVIDER_SITE_OTHER): Payer: PPO | Admitting: Psychologist

## 2021-12-26 DIAGNOSIS — F321 Major depressive disorder, single episode, moderate: Secondary | ICD-10-CM | POA: Diagnosis not present

## 2021-12-26 NOTE — Progress Notes (Signed)
                Neeko Pharo, PsyD 

## 2021-12-26 NOTE — Progress Notes (Signed)
[place] Counselor Initial Adult Exam  Name: Helen Snow Date: 12/26/2021 MRN: [id number] DOB: [birth year] PCP: McLean-Scocuzza, Nino Glow, MD  Time spent: 11:00 am to 11:41 am; total time: 41 minutes  This session was held via phone teletherapy due to the coronavirus risk at this time. The patient consented to phone teletherapy and was located at her home during this session. She is aware it is the responsibility of the patient to secure confidentiality on her end of the session. The provider was in a private home office for the duration of this session. Limits of confidentiality were discussed with the patient.   Guardian/Payee:  NA    Paperwork requested: No   Reason for Visit /Presenting Problem: Depression  Mental Status Exam: Appearance:   NA     Behavior:  Appropriate  Motor:  NA  Speech/Language:   Clear and Coherent  Affect:  Appropriate  Mood:  normal  Thought process:  normal  Thought content:    WNL  Sensory/Perceptual disturbances:    WNL  Orientation:  oriented to person, place, and time/date  Attention:  Good  Concentration:  Good  Memory:  WNL  Fund of knowledge:   Good  Insight:    Fair  Judgment:   Good  Impulse Control:  Good     Reported Symptoms:  The patient endorsed experiencing the following: feeling down, sad, tearful, social isolation, avoiding pleasurable activities, rumination of negative thoughts, and thoughts of hopelessness. She denied suicidal and homicidal ideation.   Risk Assessment: Danger to Self:  No Self-injurious Behavior: No Danger to Others: No Duty to Warn:no Physical Aggression / Violence:No  Access to Firearms a concern: No  Gang Involvement:No  Patient / guardian was educated about steps to take if suicide or homicide risk level increases between visits: n/a While future psychiatric events cannot be accurately predicted, the patient does not currently require acute inpatient psychiatric care and does not  currently meet [facility] involuntary commitment criteria.  Substance Abuse History: Current substance abuse: No     Past Psychiatric History:   No previous psychological problems have been observed Outpatient Providers:NA History of Psych Hospitalization: No  Psychological Testing:  NA    Abuse History:  Victim of: No.,  NA    Report needed: No. Victim of Neglect:No. Perpetrator of  NA   Witness / Exposure to Domestic Violence: No   Protective Services Involvement: No  Witness to [employer] Violence:  No   Family History:  Family History  Problem Relation Age of Onset   Other Mother        legally blind   Hearing loss Mother    Heart disease Mother        MI   Hyperlipidemia Mother    Hypertension Mother    Kidney disease Mother    Cancer Father        lung smoker   Breast cancer Neg Hx     Living situation: the patient lives alone  Sexual Orientation: Straight  Relationship Status: widowed  Name of spouse / other:Eugene  If a parent, number of children / ages:Denied  Support Systems: friends  Museum/gallery curator Stress:  No   Income/Employment/Disability: Actor: No   Educational History: Education: high school diploma/GED  Religion/Sprituality/World View: Christian/Baptist  Any cultural differences that may affect / interfere with treatment:  not applicable   Recreation/Hobbies: Contractor games  Stressors: Other: Current living arrangements    Strengths: Able to Communicate  Effectively  Barriers:  Current living situation   Legal History: Pending legal issue / charges: The patient has no significant history of legal issues. 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 Tami Ribas     Hyperlipidemia    Hypertension    Hypothyroidism    Osteoporosis    Personal history of chemotherapy    Personal history of radiation therapy    Postmenopausal    Right shoulder pain    right tendonitis    Past Surgical History:  Procedure Laterality Date   left tonsillar surgery     02/26/16 invasive moderate SCC   TOTAL HIP ARTHROPLASTY Right 01/26/2020   Procedure: TOTAL HIP ARTHROPLASTY;  Surgeon: Dereck Leep, MD;  Location: [facility] ORS;  Service: Orthopedics;  Laterality: Right;    Medications: Current Outpatient Medications  Medication Sig Dispense Refill   amLODipine (NORVASC) 5 MG tablet Take 1 tablet (5 mg total) by mouth in the morning and at bedtime. If BP >130/>80 180 tablet 3   amoxicillin (AMOXIL) 500 MG capsule Take 4 capsules (2,000 mg total) by mouth once as needed for up to 1 dose. 1 hour with food before dental procedure due to hip replacement 20 capsule 0   ascorbic acid (VITAMIN C) 500 MG tablet Take 500 mg by mouth 2 (two) times daily.     Calcium Carbonate-Vit D-Min (CALCIUM 1200 PO) Take 1 tablet by mouth daily.      clobetasol (TEMOVATE) 0.05 % external solution Apply 1 application topically daily as needed (itching).  (Patient not taking: Reported on 12/14/2021)     denosumab (PROLIA) 60 MG/ML SOSY injection Inject into the skin every 6 (six) months.     finasteride (PROSCAR) 5 MG tablet Take 5 mg by mouth daily.     ipratropium (ATROVENT) 0.06 % nasal spray Place 2 sprays into both nostrils in the morning. 15 mL 11   levothyroxine (SYNTHROID) 75 MCG tablet Take 1 tablet (75 mcg total) by mouth daily before breakfast. 30 minutes before food. Do not take with vitamins wait 4 hours after medication before vitamins 90 tablet 3   lisinopril (ZESTRIL) 20 MG tablet Take 1 tablet (20 mg total) by mouth in the morning and at bedtime. If BP>130/>80 take a dose hold if  BP <90-100/<60. D/c 10 180 tablet 3   LORazepam (ATIVAN) 1 MG tablet TAKE 1 TABLET (1 MG TOTAL) BY  MOUTH 2 (TWO) TIMES DAILY AS NEEDED FOR ANXIETY. D/C XANAX 0.5 60 tablet 2   Menatetrenone (VITAMIN K2) 100 MCG TABS Take 100 mcg by mouth daily.     minoxidil (LONITEN) 2.5 MG tablet Take 1.25 mg by mouth daily.     Multiple Vitamin (MULTIVITAMIN WITH MINERALS) TABS tablet Take 1 tablet by mouth daily.     pravastatin (PRAVACHOL) 40 MG tablet Take 1 tablet (40 mg total) by mouth at bedtime. 90 tablet 3   No current facility-administered medications for this visit.    Allergies  Allergen Reactions   Pioglitazone Swelling    Fluid retention    Diagnoses:  F32.1 major depressive affective disorder, single episode, moderate  Plan of Care: The patient is a 71 year old Caucasian female who was referred due to experiencing depression. The patient  lives alone. The patient meets criteria for a diagnosis of F32.1 major depressive affective disorder based off of the following: feeling down, sad, tearful, social isolation, avoiding pleasurable activities, rumination of negative thoughts, and thoughts of hopelessness. She denied suicidal and homicidal ideation.   The patient states that she wants to process her thoughts and emotions.  This psychologist makes the recommendation that the patient participate in therapy bi-weekly if possible.    Conception Chancy, PsyD

## 2021-12-26 NOTE — Plan of Care (Signed)

## 2022-01-01 LAB — COLOGUARD: COLOGUARD: NEGATIVE

## 2022-01-21 ENCOUNTER — Ambulatory Visit: Payer: PPO | Admitting: Psychologist

## 2022-02-07 ENCOUNTER — Telehealth: Payer: Self-pay | Admitting: *Deleted

## 2022-02-07 NOTE — Telephone Encounter (Signed)
Pt had [employer] advantage & based on last Prolia injection on 08/16/21. Pt will be responsible to pay $276.22. Pt aware & appt scheduled on 02/18/22. No PA Required  ummary Visit 99 Patient: Helen Snow [[id number]] Department: [facility] Location: [facility] POS: [facility] (11-11) Encounter form: [id number] Service date: 08/16/2021 Provider: McLean-Scocuzza, Nino Glow, MD Billing provider: McLean-Scocuzza, Nino Glow, MD Referral source: Delorise Jackson Diagnoses: 1) M81.0 - Age-related osteoporosis without current pathological fracture [Active] Last statement: 09/12/2021  Coverages  HE[employer] ADVANTAGE - [employer] PPO  Self-Pay  Edit Visit Coverages Coverage Change History   Transactions  Expand All # Svc Date Posted Description Amount Debits  225 08/16/21 08/16/21 [id number] - PR DENOSUMAB INJECTION (QTY: 60) 2,400.00 Credits  226 09/12/21 09/12/21 2000 - INSURANCE PAYMENT (INSURANCE) - [employer] -1,082.76 227 09/12/21 09/12/21 5128 - SEQUESTRATION ADJUSTMENT -22.10 228 09/12/21 09/12/21 3000 - CONTRACTUAL WRITE-OFF (INSURANCE) -0,626.94 230 10/04/21 10/04/21 1000 - PATIENT PAYMENT (ACCOUNT) -276.22 Outstanding balance 0.00  Payments by [employer] Payments Adjustments [employer] -1,082.76 -1,041.02 Self-Pay -276.22 0.00 Most Recent Self-Pay Payment: 10/04/2021

## 2022-02-18 ENCOUNTER — Ambulatory Visit (INDEPENDENT_AMBULATORY_CARE_PROVIDER_SITE_OTHER): Payer: PPO

## 2022-02-18 DIAGNOSIS — M810 Age-related osteoporosis without current pathological fracture: Secondary | ICD-10-CM | POA: Diagnosis not present

## 2022-02-18 MED ORDER — DENOSUMAB 60 MG/ML ~~LOC~~ SOSY
60.0000 mg | PREFILLED_SYRINGE | Freq: Once | SUBCUTANEOUS | Status: AC
  Administered 2022-02-18: 60 mg via SUBCUTANEOUS

## 2022-02-18 NOTE — Progress Notes (Signed)
Pt presented for her Subq Prolia injection. Pt was identified through two identifiers. Pt tolerated the subq injection well in her left arm. Pt stated she will be seeing Dr. Volanda Napoleon in January for [facility] and would discuss with her about her future Prolia injections.

## 2022-03-28 ENCOUNTER — Encounter: Payer: PPO | Admitting: Family Medicine

## 2022-03-29 ENCOUNTER — Ambulatory Visit (INDEPENDENT_AMBULATORY_CARE_PROVIDER_SITE_OTHER): Payer: PPO | Admitting: Nurse Practitioner

## 2022-03-29 ENCOUNTER — Encounter: Payer: Self-pay | Admitting: Nurse Practitioner

## 2022-03-29 VITALS — BP 120/62 | HR 78 | Temp 98.8°F | Ht 65.5 in | Wt 142.0 lb

## 2022-03-29 DIAGNOSIS — Z1231 Encounter for screening mammogram for malignant neoplasm of breast: Secondary | ICD-10-CM | POA: Diagnosis not present

## 2022-03-29 DIAGNOSIS — M5136 Other intervertebral disc degeneration, lumbar region: Secondary | ICD-10-CM | POA: Insufficient documentation

## 2022-03-29 DIAGNOSIS — Z96641 Presence of right artificial hip joint: Secondary | ICD-10-CM

## 2022-03-29 DIAGNOSIS — F329 Major depressive disorder, single episode, unspecified: Secondary | ICD-10-CM | POA: Insufficient documentation

## 2022-03-29 DIAGNOSIS — F419 Anxiety disorder, unspecified: Secondary | ICD-10-CM

## 2022-03-29 DIAGNOSIS — E7800 Pure hypercholesterolemia, unspecified: Secondary | ICD-10-CM | POA: Diagnosis not present

## 2022-03-29 DIAGNOSIS — I10 Essential (primary) hypertension: Secondary | ICD-10-CM | POA: Diagnosis not present

## 2022-03-29 DIAGNOSIS — E039 Hypothyroidism, unspecified: Secondary | ICD-10-CM

## 2022-03-29 DIAGNOSIS — F321 Major depressive disorder, single episode, moderate: Secondary | ICD-10-CM | POA: Diagnosis not present

## 2022-03-29 MED ORDER — LEVOTHYROXINE SODIUM 75 MCG PO TABS: 75.0000 ug | ORAL_TABLET | Freq: Every day | ORAL | 3 refills | Status: DC

## 2022-03-29 MED ORDER — PRAVASTATIN SODIUM 40 MG PO TABS: 40.0000 mg | ORAL_TABLET | Freq: Every day | ORAL | 3 refills | Status: DC

## 2022-03-29 MED ORDER — SERTRALINE HCL 25 MG PO TABS: 25.0000 mg | ORAL_TABLET | Freq: Every day | ORAL | 0 refills | Status: DC

## 2022-03-29 MED ORDER — LISINOPRIL 20 MG PO TABS: 20.0000 mg | ORAL_TABLET | Freq: Two times a day (BID) | ORAL | 3 refills | Status: DC

## 2022-03-29 MED ORDER — LORAZEPAM 1 MG PO TABS: ORAL_TABLET | ORAL | 2 refills | Status: DC

## 2022-03-29 MED ORDER — AMLODIPINE BESYLATE 5 MG PO TABS: 5.0000 mg | ORAL_TABLET | Freq: Two times a day (BID) | ORAL | 3 refills | Status: DC

## 2022-03-29 NOTE — Progress Notes (Signed)
Helen Morrow, NP-C Phone: [phone]  Helen Snow is a 72 y.o. female who presents today for transfer of care. She reports worsening depression and would like physical therapy orders for her lower back and bilateral hips. She is also requesting refills on her medications.   Depression- Patient was treated with Celexa in October 2023. She had an adverse reaction to the medication and is wanting to try something new. She believes it is worsening, she continue to not feel like herself and struggle emotionally. She has been speaking with a Social worker. Denies SI/HI.   Low Back/Hip Pain- Patient reports worsening low back and bilateral hip pain x 2-3 weeks. She had a total right hip replacement in 2021. She sees Orthopedics regularly. She has had recent imaging that showed lumbar DDD. She reports pain is greater on the left side than the right. She is requesting orders to complete physical therapy.  HYPERTENSION Disease Monitoring Home BP Monitoring- 100s/60s Chest pain- No    Dyspnea- No Medications Compliance-  Norvasc and Lisinopril   Lightheadedness-  No  Edema- No BMET    Component Value Date/Time   NA 135 12/14/2021 1350   K 4.0 12/14/2021 1350   CL 99 12/14/2021 1350   CO2 28 12/14/2021 1350   GLUCOSE 96 12/14/2021 1350   BUN 21 12/14/2021 1350   CREATININE 0.71 12/14/2021 1350   CALCIUM 9.2 12/14/2021 1350   GFRNONAA >60 01/18/2020 1032   GFRAA >60 02/26/2018 1410   HYPERLIPIDEMIA Symptoms Chest pain on exertion:  No   Leg claudication:   No Medications: Compliance- Pravastatin  Right upper quadrant pain- No  Muscle aches- No Lipid Panel     Component Value Date/Time   CHOL 150 06/11/2021 0926   TRIG 55.0 06/11/2021 0926   HDL 79.70 06/11/2021 0926   CHOLHDL 2 06/11/2021 0926   VLDL 11.0 06/11/2021 0926   LDLCALC 59 06/11/2021 0926    Social History   Tobacco Use  Smoking Status Never  Smokeless Tobacco Never    Current Outpatient Medications on File Prior  to Visit  Medication Sig Dispense Refill   amoxicillin (AMOXIL) 500 MG capsule Take 4 capsules (2,000 mg total) by mouth once as needed for up to 1 dose. 1 hour with food before dental procedure due to hip replacement 20 capsule 0   ascorbic acid (VITAMIN C) 500 MG tablet Take 500 mg by mouth 2 (two) times daily.     Calcium Carbonate-Vit D-Min (CALCIUM 1200 PO) Take 1 tablet by mouth daily.      denosumab (PROLIA) 60 MG/ML SOSY injection Inject into the skin every 6 (six) months.     finasteride (PROSCAR) 5 MG tablet Take 5 mg by mouth daily.     ipratropium (ATROVENT) 0.06 % nasal spray Place 2 sprays into both nostrils in the morning. 15 mL 11   Menatetrenone (VITAMIN K2) 100 MCG TABS Take 100 mcg by mouth daily.     minoxidil (LONITEN) 2.5 MG tablet Take 1.25 mg by mouth daily.     Multiple Vitamin (MULTIVITAMIN WITH MINERALS) TABS tablet Take 1 tablet by mouth daily.     No current facility-administered medications on file prior to visit.     ROS see history of present illness  Objective  Physical Exam Vitals:   03/29/22 1406  BP: 120/62  Pulse: 78  Temp: 98.8 F (37.1 C)  SpO2: 99%    BP Readings from Last 3 Encounters:  03/29/22 120/62  12/14/21 120/62  06/14/21 110/60  Wt Readings from Last 3 Encounters:  03/29/22 142 lb (64.4 kg)  12/14/21 139 lb (63 kg)  09/14/21 134 lb (60.8 kg)    Physical Exam Constitutional:      General: She is not in acute distress.    Appearance: Normal appearance.  HENT:     Head: Normocephalic.  Cardiovascular:     Rate and Rhythm: Normal rate and regular rhythm.     Heart sounds: Normal heart sounds.  Pulmonary:     Effort: Pulmonary effort is normal.     Breath sounds: Normal breath sounds.  Musculoskeletal:        General: No swelling or tenderness. Normal range of motion.  Skin:    General: Skin is warm and dry.  Neurological:     Mental Status: She is alert.  Psychiatric:        Mood and Affect: Mood normal.         Behavior: Behavior normal.    Assessment/Plan: Please see individual problem list.  Current moderate episode of major depressive disorder without prior episode (HCC) Assessment & Plan: Tearful during exam. Will trial Zoloft 25 QD. Advised patient to contact if behavior changes, suicidal thoughts or worsening depression occurs. She has spoken with a tcounselor, encouraged her to continue.   Orders: -     Sertraline HCl; Take 1 tablet (25 mg total) by mouth daily.  Dispense: 90 tablet; Refill: 0  DDD (degenerative disc disease), lumbar Assessment & Plan: Physical therapy orders placed for [facility] per patient's request. Follow up with Ortho.  Orders: -     Ambulatory referral to Physical Therapy  Pure hypercholesterolemia Assessment & Plan: Chronic. Stable on Pravastatin 40 mg daily. Continue. Encouraged healthy diet and exercise.   Orders: -     Pravastatin Sodium; Take 1 tablet (40 mg total) by mouth at bedtime.  Dispense: 90 tablet; Refill: 3  Essential hypertension Assessment & Plan: Chronic. Stable on Lisinopril 20 mg BID and Norvasc 5 mg BID. Patient monitors blood pressures very closely at home. She does not always take the second dose of medications, only as needed for BP > 130/80. Continue current regimen.   Orders: -     amLODIPine Besylate; Take 1 tablet (5 mg total) by mouth in the morning and at bedtime. If BP >130/>80  Dispense: 180 tablet; Refill: 3 -     Lisinopril; Take 1 tablet (20 mg total) by mouth in the morning and at bedtime. If BP>130/>80 take a dose hold if  BP <90-100/<60. D/c 10  Dispense: 180 tablet; Refill: 3  Hypothyroidism, unspecified type -     Levothyroxine Sodium; Take 1 tablet (75 mcg total) by mouth daily before breakfast. 30 minutes before food. Do not take with vitamins wait 4 hours after medication before vitamins  Dispense: 90 tablet; Refill: 3  Hx of total hip arthroplasty, right -     Ambulatory referral to Physical  Therapy  Anxiety -     LORazepam; TAKE 1 TABLET (1 MG TOTAL) BY MOUTH 2 (TWO) TIMES DAILY AS NEEDED FOR ANXIETY.  Dispense: 60 tablet; Refill: 2  Screening mammogram for breast cancer -     3D Screening Mammogram, Left and Right; Future    Return in about 3 months (around 06/28/2022).   Helen Morrow, NP-C [place]

## 2022-03-29 NOTE — Assessment & Plan Note (Addendum)
Physical therapy orders placed for [facility] per patient's request. Follow up with Ortho.

## 2022-03-29 NOTE — Assessment & Plan Note (Signed)
Chronic. Stable on Lisinopril 20 mg BID and Norvasc 5 mg BID. Patient monitors blood pressures very closely at home. She does not always take the second dose of medications, only as needed for BP > 130/80. Continue current regimen.

## 2022-03-29 NOTE — Assessment & Plan Note (Addendum)
Tearful during exam. Will trial Zoloft 25 QD. Advised patient to contact if behavior changes, suicidal thoughts or worsening depression occurs. She has spoken with a tcounselor, encouraged her to continue.

## 2022-03-29 NOTE — Assessment & Plan Note (Signed)
Chronic. Stable on Pravastatin 40 mg daily. Continue. Encouraged healthy diet and exercise.

## 2022-05-15 DIAGNOSIS — L661 Lichen planopilaris: Secondary | ICD-10-CM | POA: Diagnosis not present

## 2022-06-28 ENCOUNTER — Encounter: Payer: Self-pay | Admitting: Nurse Practitioner

## 2022-06-28 ENCOUNTER — Ambulatory Visit (INDEPENDENT_AMBULATORY_CARE_PROVIDER_SITE_OTHER): Payer: PPO | Admitting: Nurse Practitioner

## 2022-06-28 VITALS — BP 110/62 | HR 70 | Temp 98.6°F | Ht 65.5 in | Wt 137.6 lb

## 2022-06-28 DIAGNOSIS — J3489 Other specified disorders of nose and nasal sinuses: Secondary | ICD-10-CM

## 2022-06-28 DIAGNOSIS — F419 Anxiety disorder, unspecified: Secondary | ICD-10-CM | POA: Diagnosis not present

## 2022-06-28 DIAGNOSIS — F321 Major depressive disorder, single episode, moderate: Secondary | ICD-10-CM | POA: Diagnosis not present

## 2022-06-28 DIAGNOSIS — E039 Hypothyroidism, unspecified: Secondary | ICD-10-CM | POA: Diagnosis not present

## 2022-06-28 DIAGNOSIS — J302 Other seasonal allergic rhinitis: Secondary | ICD-10-CM | POA: Insufficient documentation

## 2022-06-28 MED ORDER — IPRATROPIUM BROMIDE 0.06 % NA SOLN: 2.0000 | Freq: Two times a day (BID) | NASAL | 4 refills | Status: DC

## 2022-06-28 MED ORDER — SERTRALINE HCL 50 MG PO TABS: 50.0000 mg | ORAL_TABLET | Freq: Every day | ORAL | 3 refills | Status: DC

## 2022-06-28 MED ORDER — LORAZEPAM 1 MG PO TABS: ORAL_TABLET | ORAL | 5 refills | Status: DC

## 2022-06-28 NOTE — Assessment & Plan Note (Signed)
Chronic. Will increase Atrovent nasal spray to 2 sprays in each nostril twice daily. Will continue to monitor. Advised to contact if symptoms are worsening or changing.

## 2022-06-28 NOTE — Progress Notes (Signed)
Bethanie Dicker, NP-C Phone: [phone]  Helen Snow is a 72 y.o. female who presents today for follow up.   Anxiety/Depression- PHQ- 7 and GAD- 10 today. She is doing well on her Zoloft 25 mg daily. Reports decreased crying spells and doing better emotionally. She has not had any side effects. She would like to increase to 50 mg daily. She continues to take her Ativan 1 mg twice daily. She is needing refills.   HYPOTHYROIDISM Disease Monitoring Weight changes: No  Skin Changes: No Palpitations: No Heat/Cold intolerance: No Medication Monitoring Compliance:  Levothyroxine 75 mcg   Last TSH:   Lab Results  Component Value Date   TSH 1.56 12/14/2021   Rhinorrhea- Hx of chronic rhinorrhea. Has been using Atrovent nasal spray 2 sprays in each nostril once daily, she would like to increase as she has been having worsening symptoms in the afternoons. Denies sneezing. Denies watery or itchy eyes.   Social History   Tobacco Use  Smoking Status Never  Smokeless Tobacco Never    Current Outpatient Medications on File Prior to Visit  Medication Sig Dispense Refill   amLODipine (NORVASC) 5 MG tablet Take 1 tablet (5 mg total) by mouth in the morning and at bedtime. If BP >130/>80 180 tablet 3   amoxicillin (AMOXIL) 500 MG capsule Take 4 capsules (2,000 mg total) by mouth once as needed for up to 1 dose. 1 hour with food before dental procedure due to hip replacement 20 capsule 0   ascorbic acid (VITAMIN C) 500 MG tablet Take 500 mg by mouth 2 (two) times daily.     Calcium Carbonate-Vit D-Min (CALCIUM 1200 PO) Take 1 tablet by mouth daily.      denosumab (PROLIA) 60 MG/ML SOSY injection Inject into the skin every 6 (six) months.     finasteride (PROSCAR) 5 MG tablet Take 5 mg by mouth daily.     levothyroxine (SYNTHROID) 75 MCG tablet Take 1 tablet (75 mcg total) by mouth daily before breakfast. 30 minutes before food. Do not take with vitamins wait 4 hours after medication before  vitamins 90 tablet 3   lisinopril (ZESTRIL) 20 MG tablet Take 1 tablet (20 mg total) by mouth in the morning and at bedtime. If BP>130/>80 take a dose hold if  BP <90-100/<60. D/c 10 180 tablet 3   Menatetrenone (VITAMIN K2) 100 MCG TABS Take 100 mcg by mouth daily.     minoxidil (LONITEN) 2.5 MG tablet Take 1.25 mg by mouth daily.     Multiple Vitamin (MULTIVITAMIN WITH MINERALS) TABS tablet Take 1 tablet by mouth daily.     pravastatin (PRAVACHOL) 40 MG tablet Take 1 tablet (40 mg total) by mouth at bedtime. 90 tablet 3   No current facility-administered medications on file prior to visit.    ROS see history of present illness  Objective  Physical Exam Vitals:   06/28/22 1501  BP: 110/62  Pulse: 70  Temp: 98.6 F (37 C)  SpO2: 99%    BP Readings from Last 3 Encounters:  06/28/22 110/62  03/29/22 120/62  12/14/21 120/62   Wt Readings from Last 3 Encounters:  06/28/22 137 lb 9.6 oz (62.4 kg)  03/29/22 142 lb (64.4 kg)  12/14/21 139 lb (63 kg)    Physical Exam Constitutional:      General: She is not in acute distress.    Appearance: Normal appearance.  HENT:     Head: Normocephalic.  Cardiovascular:     Rate and Rhythm:  Normal rate and regular rhythm.     Heart sounds: Normal heart sounds.  Pulmonary:     Effort: Pulmonary effort is normal.     Breath sounds: Normal breath sounds.  Skin:    General: Skin is warm and dry.  Neurological:     General: No focal deficit present.     Mental Status: She is alert.  Psychiatric:        Mood and Affect: Mood normal.        Behavior: Behavior normal.    Assessment/Plan: Please see individual problem list.  Current moderate episode of major depressive disorder without prior episode Assessment & Plan: Doing well on Zoloft, feels better emotionally and would like to increase. Will increase to 50 mg daily. Denies SI/HI. Encouraged to contact if worsening symptoms, unusual behavior changes or suicidal thoughts occur.  Encouraged to continue speaking with a counselor. Will continue to monitor.   Orders: -     Sertraline HCl; Take 1 tablet (50 mg total) by mouth daily.  Dispense: 90 tablet; Refill: 3  Anxiety Assessment & Plan: Chronic. Stable on Ativan 1 mg BID. Continue. Refills sent. PDMP reviewed. Also, increasing Zoloft to 50 mg daily. Will continue to monitor.   Orders: -     Sertraline HCl; Take 1 tablet (50 mg total) by mouth daily.  Dispense: 90 tablet; Refill: 3 -     LORazepam; TAKE 1 TABLET (1 MG TOTAL) BY MOUTH 2 (TWO) TIMES DAILY AS NEEDED FOR ANXIETY.  Dispense: 60 tablet; Refill: 5  Rhinorrhea Assessment & Plan: Chronic. Will increase Atrovent nasal spray to 2 sprays in each nostril twice daily. Will continue to monitor. Advised to contact if symptoms are worsening or changing.   Orders: -     Ipratropium Bromide; Place 2 sprays into both nostrils 2 (two) times daily.  Dispense: 30 mL; Refill: 4  Hypothyroidism, unspecified type Assessment & Plan: Chronic. Stable on Levothyroxine 75 mcg daily. Continue. Will plan to recheck TSH at next appointment.     Return in about 6 months (around 12/28/2022) for Follow up.   Bethanie Dicker, NP-C [facility] [employer]

## 2022-06-28 NOTE — Assessment & Plan Note (Addendum)
Doing well on Zoloft, feels better emotionally and would like to increase. Will increase to 50 mg daily. Denies SI/HI. Encouraged to contact if worsening symptoms, unusual behavior changes or suicidal thoughts occur. Encouraged to continue speaking with a counselor. Will continue to monitor.

## 2022-06-28 NOTE — Assessment & Plan Note (Addendum)
Chronic. Stable on Ativan 1 mg BID. Continue. Refills sent. PDMP reviewed. Also, increasing Zoloft to 50 mg daily. Will continue to monitor.

## 2022-06-28 NOTE — Assessment & Plan Note (Signed)
Chronic. Stable on Levothyroxine 75 mcg daily. Continue. Will plan to recheck TSH at next appointment.

## 2022-07-09 ENCOUNTER — Telehealth: Payer: Self-pay | Admitting: Nurse Practitioner

## 2022-07-09 NOTE — Telephone Encounter (Signed)
Contacted Helen Snow to schedule their annual wellness visit. Appointment made for 07/24/2022.  Thank you,  [facility] Support [facility] Direct dial  [phone]

## 2022-07-24 ENCOUNTER — Ambulatory Visit (INDEPENDENT_AMBULATORY_CARE_PROVIDER_SITE_OTHER): Payer: PPO

## 2022-07-24 DIAGNOSIS — Z0000 Encounter for general adult medical examination without abnormal findings: Secondary | ICD-10-CM | POA: Diagnosis not present

## 2022-07-24 NOTE — Patient Instructions (Signed)

## 2022-07-24 NOTE — Progress Notes (Addendum)
I connected with  Cristene Reckers Wanninger on 07/24/22 by a audio enabled telemedicine application and verified that I am speaking with the correct person using two identifiers.  Patient Location: Home  Provider Location: Home Office  I discussed the limitations of evaluation and management by telemedicine. The patient expressed understanding and agreed to proceed.  Subjective:   Helen Snow is a 72 y.o. female who presents for Medicare Annual (Subsequent) preventive examination.  Review of Systems    Per HPI unless specifically indicated below.  Cardiac Risk Factors include: advanced age (>7men, >18 women);female gender, Essential Hypertension, and Pure hypercholesterolemia.         Objective:       06/28/2022    3:01 PM 03/29/2022    2:06 PM 12/14/2021    1:10 PM  Vitals with BMI  Height 5' 5.5" 5' 5.5" 5' 5.5"  Weight 137 lbs 10 oz 142 lbs 139 lbs  BMI 22.54 23.26 22.77  Systolic 110 120 161  Diastolic 62 62 62  Pulse 70 78 70    There were no vitals filed for this visit. There is no height or weight on file to calculate BMI.     07/24/2022   10:48 AM 09/14/2021   10:48 AM 01/26/2020    2:31 PM 01/26/2020    2:19 PM 01/18/2020    9:39 AM 11/11/2019   10:13 AM 02/26/2018    2:36 PM  Advanced Directives  Does Patient Have a Medical Advance Directive? Yes Yes  Yes Yes Yes Yes  Type of Estate agent of [place];Living will Healthcare Power of [place];Living will Healthcare Power of [place];Living will  Healthcare Power of [employer] of [place];Living will Living will;Healthcare Power of Attorney  Does patient want to make changes to medical advance directive? No - Guardian declined No - Patient declined No - Patient declined  No - Patient declined No - Patient declined No - Patient declined  Copy of Healthcare Power of Attorney in Chart? Yes - validated most recent copy scanned in chart (See row information) Yes - validated most  recent copy scanned in chart (See row information)   Yes - validated most recent copy scanned in chart (See row information) Yes - validated most recent copy scanned in chart (See row information) Yes - validated most recent copy scanned in chart (See row information)  Would patient like information on creating a medical advance directive?  No - Patient declined         Current Medications (verified) Outpatient Encounter Medications as of 07/24/2022  Medication Sig   amLODipine (NORVASC) 5 MG tablet Take 1 tablet (5 mg total) by mouth in the morning and at bedtime. If BP >130/>80   amoxicillin (AMOXIL) 500 MG capsule Take 4 capsules (2,000 mg total) by mouth once as needed for up to 1 dose. 1 hour with food before dental procedure due to hip replacement   ascorbic acid (VITAMIN C) 500 MG tablet Take 500 mg by mouth 2 (two) times daily.   Calcium Carbonate-Vit D-Min (CALCIUM 1200 PO) Take 1 tablet by mouth daily.    denosumab (PROLIA) 60 MG/ML SOSY injection Inject into the skin every 6 (six) months.   finasteride (PROSCAR) 5 MG tablet Take 5 mg by mouth daily.   ipratropium (ATROVENT) 0.06 % nasal spray Place 2 sprays into both nostrils 2 (two) times daily.   levothyroxine (SYNTHROID) 75 MCG tablet Take 1 tablet (75 mcg total) by mouth daily before breakfast. 30  minutes before food. Do not take with vitamins wait 4 hours after medication before vitamins   lisinopril (ZESTRIL) 20 MG tablet Take 1 tablet (20 mg total) by mouth in the morning and at bedtime. If BP>130/>80 take a dose hold if  BP <90-100/<60. D/c 10   LORazepam (ATIVAN) 1 MG tablet TAKE 1 TABLET (1 MG TOTAL) BY MOUTH 2 (TWO) TIMES DAILY AS NEEDED FOR ANXIETY.   Menatetrenone (VITAMIN K2) 100 MCG TABS Take 100 mcg by mouth daily.   minoxidil (LONITEN) 2.5 MG tablet Take 1.25 mg by mouth daily.   Multiple Vitamin (MULTIVITAMIN WITH MINERALS) TABS tablet Take 1 tablet by mouth daily.   pravastatin (PRAVACHOL) 40 MG tablet Take 1 tablet  (40 mg total) by mouth at bedtime.   sertraline (ZOLOFT) 50 MG tablet Take 1 tablet (50 mg total) by mouth daily. (Patient taking differently: Take 25 mg by mouth daily.)   No facility-administered encounter medications on file as of 07/24/2022.    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 Jenne Campus    Hyperlipidemia    Hypertension    Hypothyroidism    Osteoporosis    Personal history of chemotherapy    Personal history of radiation therapy    Postmenopausal    Right shoulder pain    right tendonitis   Past Surgical History:  Procedure Laterality Date   left tonsillar surgery     02/26/16 invasive moderate SCC   TOTAL HIP ARTHROPLASTY Right 01/26/2020   Procedure: TOTAL HIP ARTHROPLASTY;  Surgeon: Donato Heinz, MD;  Location: [facility] ORS;  Service: Orthopedics;  Laterality: Right;   Family History  Problem Relation Age of Onset   Other Mother        legally blind   Hearing loss Mother    Heart disease Mother        MI   Hyperlipidemia Mother    Hypertension Mother    Kidney disease Mother    Cancer Father        lung smoker   Breast cancer Neg Hx    Social History   Socioeconomic History   Marital status: Widowed    Spouse name: Not on file   Number of children: Not on file   Years of education: Not on file   Highest education level: Not on file  Occupational History   Occupation: Part Time  Tobacco Use   Smoking status: Never   Smokeless tobacco: Never  Vaping Use   Vaping Use: Never used  Substance and Sexual Activity   Alcohol use: Not Currently   Drug use: Never   Sexual activity: Not Currently  Other Topics Concern   Not on file  Social History Narrative   Widowed    No kids    No siblings    Never smoker   No pets   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Marrianne Mood    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    Social Determinants of [employer]: Low Risk  (07/24/2022)   Overall Financial Resource Strain (CARDIA)    Difficulty of Paying Living Expenses: Not hard at all  Food Insecurity: No Food Insecurity (07/24/2022)   Hunger Vital Sign    Worried  About Running Out of Food in the Last Year: Never true    Ran Out of Food in the Last Year: Never true  Transportation Needs: No Transportation Needs (07/24/2022)   PRAPARE - Administrator, Civil Service (Medical): No    Lack of Transportation (Non-Medical): No  Physical Activity: Inactive (07/24/2022)   Exercise Vital Sign    Days of Exercise per Week: 0 days    Minutes of Exercise per Session: 0 min  Stress: No Stress Concern Present (07/24/2022)   [employer] of Occupational Health - Occupational Stress Questionnaire    Feeling of Stress : Not at all  Social Connections: Moderately Integrated (07/24/2022)   Social Connection and Isolation Panel [NHANES]    Frequency of Communication with Friends and Family: More than three times a week    Frequency of Social Gatherings with Friends and Family: More than three times a week    Attends Religious Services: More than 4 times per year    Active Member of [employer] or Organizations: Yes    Attends Banker Meetings: More than 4 times per year    Marital Status: Widowed    Tobacco Counseling Counseling given: No   Clinical Intake:  Pre-visit preparation completed: No  Pain : No/denies pain     Nutritional Status: BMI of 19-24  Normal Nutritional Risks: None Diabetes: No  How often do you need to have someone help you when you read instructions, pamphlets, or other written materials from your doctor or pharmacy?: 1 - Never  Diabetic?No Interpreter Needed?: No  Information entered by :: Laurel Dimmer, CMA   Activities of Daily Living    07/24/2022   10:36 AM 09/14/2021    10:49 AM  In your present state of health, do you have any difficulty performing the following activities:  Hearing? 0 0  Vision? 0 0  Comment [facility]   Difficulty concentrating or making decisions? 0 0  Walking or climbing stairs? 1 0  Dressing or bathing? 0 0  Doing errands, shopping? 0 0  Preparing Food and eating ?  N  Using the Toilet?  N  In the past six months, have you accidently leaked urine?  N  Do you have problems with loss of bowel control?  N  Managing your Medications?  N  Managing your Finances?  N  Housekeeping or managing your Housekeeping?  N    Patient Care Team: Bethanie Dicker, NP as PCP - General (Nurse Practitioner) Carmina Miller, MD as Referring Physician (Radiation Oncology)  Indicate any recent Medical Services you may have received from other than [facility] providers in the past year (date may be approximate).     Assessment:   This is a routine wellness examination for [place].  Hearing/Vision screen Denies any hearing issues. Denies any vision changes. Annual Eye Exam, [facility]    Dietary issues and exercise activities discussed: Current Exercise Habits: The patient does not participate in regular exercise at present, Exercise limited by: None identified   Goals Addressed   None   Depression Screen    07/24/2022   10:36 AM 06/28/2022    3:02 PM 03/29/2022    2:12 PM 12/14/2021    1:12 PM 09/14/2021   10:44 AM 01/17/2021    2:03 PM 11/11/2019   10:11 AM  PHQ 2/9 Scores  PHQ - 2 Score 1 2 0 3 0 4 0  PHQ- 9 Score  7  6  10  Fall Risk    07/24/2022   10:36 AM 06/28/2022    3:02 PM 03/29/2022    2:12 PM 12/14/2021    1:11 PM 09/14/2021   10:38 AM  Fall Risk   Falls in the past year? 0 0 0 0 0  Number falls in past yr: 0 0 0 0   Injury with Fall? 0 0 0 0   Risk for fall due to : No Fall Risks No Fall Risks Impaired balance/gait No Fall Risks   Follow up Falls evaluation completed Falls evaluation completed Falls evaluation  completed Falls evaluation completed Falls evaluation completed    FALL RISK PREVENTION PERTAINING TO THE HOME:  Any stairs in or around the home? Yes  If so, are there any without handrails? No  Home free of loose throw rugs in walkways, pet beds, electrical cords, etc? Yes  Adequate lighting in your home to reduce risk of falls? Yes   ASSISTIVE DEVICES UTILIZED TO PREVENT FALLS:  Life alert? Yes  Use of a cane, walker or w/c? No  Grab bars in the bathroom? Yes  Shower chair or bench in shower? Yes  Elevated toilet seat or a handicapped toilet? Yes   TIMED UP AND ZO:XWRUEA to perform, virtual appointment    Cognitive Function:        07/24/2022   10:40 AM  6CIT Screen  What Year? 0 points  What month? 0 points  What time? 0 points  Count back from 20 0 points  Months in reverse 0 points  Repeat phrase 0 points  Total Score 0 points    Immunizations Immunization History  Administered Date(s) Administered   Influenza, High Dose Seasonal PF 12/25/2016, 01/01/2018, 12/29/2018, 12/31/2019, 01/03/2021   Influenza-Unspecified 12/23/2013, 12/22/2014, 12/29/2015, 12/25/2016, 12/29/2018   PFIZER Comirnaty(Gray Top)Covid-19 Tri-Sucrose Vaccine 05/09/2020   PFIZER(Purple Top)SARS-COV-2 Vaccination 06/04/2019, 06/29/2019   Pfizer Covid-19 Vaccine Bivalent Booster 82yrs & up 11/28/2020   Pneumococcal Conjugate-13 12/22/2018, 01/20/2019   Pneumococcal Polysaccharide-23 06/26/2017   Tdap 10/01/2017    TDAP status: Up to date  Flu Vaccine status: Up to date  Pneumococcal vaccine status: Up to date  Covid-19 vaccine status: Information provided on how to obtain vaccines.   Qualifies for Shingles Vaccine? Yes   Zostavax completed Shingrix discontinued   Screening Tests Health Maintenance  Topic Date Due   COVID-19 Vaccine (5 - 2023-24 season) 11/09/2021   INFLUENZA VACCINE  10/10/2022   MAMMOGRAM  04/19/2023   Medicare Annual Wellness (AWV)  07/24/2023   DTaP/Tdap/Td  (2 - Td or Tdap) 10/02/2027   Pneumonia Vaccine 23+ Years old  Completed   DEXA SCAN  Completed   Hepatitis C Screening  Completed   HPV VACCINES  Aged Out   COLONOSCOPY (Pts 45-24yrs Insurance coverage will need to be confirmed)  Discontinued   Zoster Vaccines- Shingrix  Discontinued    Health Maintenance  Health Maintenance Due  Topic Date Due   COVID-19 Vaccine (5 - 2023-24 season) 11/09/2021    Colorectal cancer screening: Type of screening: Cologuard. Completed 12/25/2021. Repeat every 3 years  Mammogram status: Completed 04/18/21. Repeat every year, ordered 03/29/2022  DEXA Scan:08/23/2021  Lung Cancer Screening: (Low Dose CT Chest recommended if Age 58-80 years, 30 pack-year currently smoking OR have quit w/in 15years.) does not qualify.   Lung Cancer Screening Referral: not applicable   Additional Screening:  Hepatitis C Screening: does qualify; Completed 12/29/2018  Vision Screening: Recommended annual ophthalmology exams for early detection of glaucoma and other disorders  of the eye. Is the patient up to date with their annual eye exam?  Yes  Who is the provider or what is the name of the office in which the patient attends annual eye exams? [facility]  If pt is not established with a provider, would they like to be referred to a provider to establish care? No .   Dental Screening: Recommended annual dental exams for proper oral hygiene  Community Resource Referral / Chronic Care Management: CRR required this visit?  No   CCM required this visit?  No      Plan:     I have personally reviewed and noted the following in the patient's chart:   Medical and social history Use of alcohol, tobacco or illicit drugs  Current medications and supplements including opioid prescriptions. Patient is not currently taking opioid prescriptions. Functional ability and status Nutritional status Physical activity Advanced directives List of other  physicians Hospitalizations, surgeries, and ER visits in previous 12 months Vitals Screenings to include cognitive, depression, and falls Referrals and appointments  In addition, I have reviewed and discussed with patient certain preventive protocols, quality metrics, and best practice recommendations. A written personalized care plan for preventive services as well as general preventive health recommendations were provided to patient.    Ms. Sadek , Thank you for taking time to come for your Medicare Wellness Visit. I appreciate your ongoing commitment to your health goals. Please review the following plan we discussed and let me know if I can assist you in the future.   These are the goals we discussed:  Goals      Follow up with Primary Care Provider     As needed.        This is a list of the screening recommended for you and due dates:  Health Maintenance  Topic Date Due   COVID-19 Vaccine (5 - 2023-24 season) 11/09/2021   Flu Shot  10/10/2022   Mammogram  04/19/2023   Medicare Annual Wellness Visit  07/24/2023   DTaP/Tdap/Td vaccine (2 - Td or Tdap) 10/02/2027   Pneumonia Vaccine  Completed   DEXA scan (bone density measurement)  Completed   Hepatitis C Screening: USPSTF Recommendation to screen - Ages 55-79 yo.  Completed   HPV Vaccine  Aged Out   Colon Cancer Screening  Discontinued   Zoster (Shingles) Vaccine  Discontinued     Lonna Cobb, [facility]   07/24/2022   Nurse Notes: Approximately 30 minute Non-Face -To-Face Medicare Wellness Visit

## 2022-07-31 ENCOUNTER — Telehealth: Payer: Self-pay | Admitting: Nurse Practitioner

## 2022-07-31 ENCOUNTER — Encounter: Payer: Self-pay | Admitting: Nurse Practitioner

## 2022-07-31 ENCOUNTER — Ambulatory Visit (INDEPENDENT_AMBULATORY_CARE_PROVIDER_SITE_OTHER): Payer: PPO | Admitting: Nurse Practitioner

## 2022-07-31 VITALS — BP 116/62 | HR 71 | Temp 98.5°F | Ht 65.5 in | Wt 136.4 lb

## 2022-07-31 DIAGNOSIS — F330 Major depressive disorder, recurrent, mild: Secondary | ICD-10-CM | POA: Diagnosis not present

## 2022-07-31 DIAGNOSIS — F419 Anxiety disorder, unspecified: Secondary | ICD-10-CM | POA: Diagnosis not present

## 2022-07-31 MED ORDER — BUPROPION HCL ER (XL) 150 MG PO TB24: 150.0000 mg | ORAL_TABLET | Freq: Every day | ORAL | 0 refills | Status: DC

## 2022-07-31 NOTE — Assessment & Plan Note (Addendum)
Will discontinue Zoloft and start patient on Wellbutrin XL 150 mg daily. Counseled patient on common side effects. Encouraged to contact if worsening symptoms, unusual behavior changes or suicidal thoughts occur. GAD- 6 today. She will follow up in one month. Will continue to monitor.

## 2022-07-31 NOTE — Progress Notes (Signed)
Bethanie Dicker, NP-C Phone: [phone]  Helen Snow is a 72 y.o. female who presents today to discuss medication.  She was started on Zoloft 25 mg daily in January, then increased to 50 mg daily last month. She took the increased dose for 2 weeks then noticed that it caused her to become more hungry and had increased her appetite so she went back to taking 25 mg daily. She has been doing that for the last 2 weeks. She reports her appetite has somewhat returned to normal, it is not as bad as it was when taking the 50 mg daily. She is interested in switching from Zoloft to Wellbutrin XL. She states she has done some research on it and would like to try it. She also has some friends that take it and it has been very helpful for them.   Social History   Tobacco Use  Smoking Status Never  Smokeless Tobacco Never    Current Outpatient Medications on File Prior to Visit  Medication Sig Dispense Refill   amLODipine (NORVASC) 5 MG tablet Take 1 tablet (5 mg total) by mouth in the morning and at bedtime. If BP >130/>80 180 tablet 3   amoxicillin (AMOXIL) 500 MG capsule Take 4 capsules (2,000 mg total) by mouth once as needed for up to 1 dose. 1 hour with food before dental procedure due to hip replacement 20 capsule 0   ascorbic acid (VITAMIN C) 500 MG tablet Take 500 mg by mouth 2 (two) times daily.     Calcium Carbonate-Vit D-Min (CALCIUM 1200 PO) Take 1 tablet by mouth daily.      denosumab (PROLIA) 60 MG/ML SOSY injection Inject into the skin every 6 (six) months.     finasteride (PROSCAR) 5 MG tablet Take 5 mg by mouth daily.     ipratropium (ATROVENT) 0.06 % nasal spray Place 2 sprays into both nostrils 2 (two) times daily. 30 mL 4   levothyroxine (SYNTHROID) 75 MCG tablet Take 1 tablet (75 mcg total) by mouth daily before breakfast. 30 minutes before food. Do not take with vitamins wait 4 hours after medication before vitamins 90 tablet 3   lisinopril (ZESTRIL) 20 MG tablet Take 1 tablet  (20 mg total) by mouth in the morning and at bedtime. If BP>130/>80 take a dose hold if  BP <90-100/<60. D/c 10 180 tablet 3   LORazepam (ATIVAN) 1 MG tablet TAKE 1 TABLET (1 MG TOTAL) BY MOUTH 2 (TWO) TIMES DAILY AS NEEDED FOR ANXIETY. 60 tablet 5   Menatetrenone (VITAMIN K2) 100 MCG TABS Take 100 mcg by mouth daily.     minoxidil (LONITEN) 2.5 MG tablet Take 1.25 mg by mouth daily.     Multiple Vitamin (MULTIVITAMIN WITH MINERALS) TABS tablet Take 1 tablet by mouth daily.     pravastatin (PRAVACHOL) 40 MG tablet Take 1 tablet (40 mg total) by mouth at bedtime. 90 tablet 3   No current facility-administered medications on file prior to visit.    ROS see history of present illness  Objective  Physical Exam Vitals:   07/31/22 1450  BP: 116/62  Pulse: 71  Temp: 98.5 F (36.9 C)  SpO2: 99%    BP Readings from Last 3 Encounters:  07/31/22 116/62  06/28/22 110/62  03/29/22 120/62   Wt Readings from Last 3 Encounters:  07/31/22 136 lb 6.4 oz (61.9 kg)  06/28/22 137 lb 9.6 oz (62.4 kg)  03/29/22 142 lb (64.4 kg)    Physical Exam Constitutional:  General: She is not in acute distress.    Appearance: Normal appearance.  HENT:     Head: Normocephalic.  Cardiovascular:     Rate and Rhythm: Normal rate and regular rhythm.     Heart sounds: Normal heart sounds.  Pulmonary:     Effort: Pulmonary effort is normal.     Breath sounds: Normal breath sounds.  Skin:    General: Skin is warm and dry.  Neurological:     General: No focal deficit present.     Mental Status: She is alert.  Psychiatric:        Mood and Affect: Mood normal.        Behavior: Behavior normal.     Assessment/Plan: Please see individual problem list.  Mild episode of recurrent major depressive disorder (HCC) Assessment & Plan: Will discontinue Zoloft and start patient on Wellbutrin XL 150 mg daily. She will stop the Zoloft and directly switch to Wellbutrin XL. Counseled patient on common side  effects. Encouraged to contact if worsening symptoms, unusual behavior changes or suicidal thoughts occur. PHQ- 6 today. She will follow up in one month. Will continue to monitor.   Orders: -     buPROPion HCl ER (XL); Take 1 tablet (150 mg total) by mouth daily.  Dispense: 90 tablet; Refill: 0  Anxiety Assessment & Plan: Will discontinue Zoloft and start patient on Wellbutrin XL 150 mg daily. Counseled patient on common side effects. Encouraged to contact if worsening symptoms, unusual behavior changes or suicidal thoughts occur. GAD- 6 today. She will follow up in one month. Will continue to monitor.   Orders: -     buPROPion HCl ER (XL); Take 1 tablet (150 mg total) by mouth daily.  Dispense: 90 tablet; Refill: 0   Return in about 4 weeks (around 08/28/2022) for Anxiety/Depression.   Bethanie Dicker, NP-C [facility] [employer]

## 2022-07-31 NOTE — Telephone Encounter (Signed)
Helen Snow from [facility] needs to know if Helen Snow is canceling her Sertraline  or is she taking this along with  buPROPion (WELLBUTRIN XL) 150 MG 24 hr tablet.   Please call Danford Bad at [phone].

## 2022-07-31 NOTE — Telephone Encounter (Signed)
Called Helen Snow and informed her it is being cancelled

## 2022-07-31 NOTE — Assessment & Plan Note (Addendum)
Will discontinue Zoloft and start patient on Wellbutrin XL 150 mg daily. She will stop the Zoloft and directly switch to Wellbutrin XL. Counseled patient on common side effects. Encouraged to contact if worsening symptoms, unusual behavior changes or suicidal thoughts occur. PHQ- 6 today. She will follow up in one month. Will continue to monitor.

## 2022-08-28 ENCOUNTER — Encounter: Payer: Self-pay | Admitting: Nurse Practitioner

## 2022-08-28 ENCOUNTER — Ambulatory Visit (INDEPENDENT_AMBULATORY_CARE_PROVIDER_SITE_OTHER): Payer: PPO | Admitting: Nurse Practitioner

## 2022-08-28 VITALS — BP 116/60 | HR 66 | Temp 99.1°F | Ht 65.5 in | Wt 135.4 lb

## 2022-08-28 DIAGNOSIS — L669 Cicatricial alopecia, unspecified: Secondary | ICD-10-CM

## 2022-08-28 DIAGNOSIS — F330 Major depressive disorder, recurrent, mild: Secondary | ICD-10-CM

## 2022-08-28 DIAGNOSIS — M810 Age-related osteoporosis without current pathological fracture: Secondary | ICD-10-CM

## 2022-08-28 MED ORDER — PROLIA 60 MG/ML ~~LOC~~ SOSY: 60.0000 mg | PREFILLED_SYRINGE | SUBCUTANEOUS | 0 refills | Status: DC

## 2022-08-28 NOTE — Progress Notes (Signed)
Bethanie Dicker, NP-C Phone: [phone]  Helen Snow is a 72 y.o. female who presents today for follow up.   She was started on Wellbutrin XL 150 mg daily last month. She started on the medication and noticed an increase in hair loss. She has a Hx of scarring alopecia, which she sees Dermatology for. Since being on the Wellbutrin she felt as though she was excessively losing her hair therefore she stopped the medication. She did notice an increase in her mood, motivation and interest in doing things while on the medication. Since stopping the medication she does not feel that she is losing as much hair now. She has an appointment with her Dermatologist in 2 days. She is going to discuss treatment options with them regarding her alopecia and if they feel she could go back on the Wellbutrin or try it at a reduced dose. She is not interested in trying any other antidepressant medications at this time. PHQ- 4 and GAD- 3 today.   Social History   Tobacco Use  Smoking Status Never  Smokeless Tobacco Never    Current Outpatient Medications on File Prior to Visit  Medication Sig Dispense Refill   amLODipine (NORVASC) 5 MG tablet Take 1 tablet (5 mg total) by mouth in the morning and at bedtime. If BP >130/>80 180 tablet 3   amoxicillin (AMOXIL) 500 MG capsule Take 4 capsules (2,000 mg total) by mouth once as needed for up to 1 dose. 1 hour with food before dental procedure due to hip replacement 20 capsule 0   ascorbic acid (VITAMIN C) 500 MG tablet Take 500 mg by mouth 2 (two) times daily.     Calcium Carbonate-Vit D-Min (CALCIUM 1200 PO) Take 1 tablet by mouth daily.      finasteride (PROSCAR) 5 MG tablet Take 5 mg by mouth daily.     ipratropium (ATROVENT) 0.06 % nasal spray Place 2 sprays into both nostrils 2 (two) times daily. 30 mL 4   levothyroxine (SYNTHROID) 75 MCG tablet Take 1 tablet (75 mcg total) by mouth daily before breakfast. 30 minutes before food. Do not take with vitamins wait  4 hours after medication before vitamins 90 tablet 3   lisinopril (ZESTRIL) 20 MG tablet Take 1 tablet (20 mg total) by mouth in the morning and at bedtime. If BP>130/>80 take a dose hold if  BP <90-100/<60. D/c 10 180 tablet 3   LORazepam (ATIVAN) 1 MG tablet TAKE 1 TABLET (1 MG TOTAL) BY MOUTH 2 (TWO) TIMES DAILY AS NEEDED FOR ANXIETY. 60 tablet 5   Menatetrenone (VITAMIN K2) 100 MCG TABS Take 100 mcg by mouth daily.     minoxidil (LONITEN) 2.5 MG tablet Take 1.25 mg by mouth daily.     Multiple Vitamin (MULTIVITAMIN WITH MINERALS) TABS tablet Take 1 tablet by mouth daily.     pravastatin (PRAVACHOL) 40 MG tablet Take 1 tablet (40 mg total) by mouth at bedtime. 90 tablet 3   No current facility-administered medications on file prior to visit.    ROS see history of present illness  Objective  Physical Exam Vitals:   08/28/22 1407  BP: 116/60  Pulse: 66  Temp: 99.1 F (37.3 C)  SpO2: 99%    BP Readings from Last 3 Encounters:  08/28/22 116/60  07/31/22 116/62  06/28/22 110/62   Wt Readings from Last 3 Encounters:  08/28/22 135 lb 6.4 oz (61.4 kg)  07/31/22 136 lb 6.4 oz (61.9 kg)  06/28/22 137 lb 9.6 oz (  62.4 kg)    Physical Exam Constitutional:      General: She is not in acute distress.    Appearance: Normal appearance.  HENT:     Head: Normocephalic.  Cardiovascular:     Rate and Rhythm: Normal rate and regular rhythm.     Heart sounds: Normal heart sounds.  Pulmonary:     Effort: Pulmonary effort is normal.     Breath sounds: Normal breath sounds.  Skin:    General: Skin is warm and dry.  Neurological:     General: No focal deficit present.     Mental Status: She is alert.  Psychiatric:        Mood and Affect: Mood normal.        Behavior: Behavior normal.    Assessment/Plan: Please see individual problem list.  Mild episode of recurrent major depressive disorder (HCC) Assessment & Plan: PHQ- 4 today. Stopped the Wellbutrin XL due to increase in hair  loss. She is seeing her Dermatologist in 2 days. She will let me know if she would like to restart the medication or try a reduced dose after discussing with them. Advised that we could try another medication, she is not interested at this time. Will continue to monitor.    Scarring alopecia Assessment & Plan: Chronic. Managed by Dermatology. She is on oral medications and gets injections in her scalp to help with hair loss. Follow up as scheduled on Friday.    Osteoporosis, unspecified osteoporosis type, unspecified pathological fracture presence -     Prolia; Inject 60 mg into the skin every 6 (six) months.  Dispense: 180 mL; Refill: 0   Return in 4 months (on 01/02/2023) for Follow up as scheduled.   Bethanie Dicker, NP-C [facility] - [employer]

## 2022-08-28 NOTE — Assessment & Plan Note (Signed)
Chronic. Managed by Dermatology. She is on oral medications and gets injections in her scalp to help with hair loss. Follow up as scheduled on Friday.

## 2022-08-28 NOTE — Assessment & Plan Note (Signed)
PHQ- 4 today. Stopped the Wellbutrin XL due to increase in hair loss. She is seeing her Dermatologist in 2 days. She will let me know if she would like to restart the medication or try a reduced dose after discussing with them. Advised that we could try another medication, she is not interested at this time. Will continue to monitor.

## 2022-08-30 ENCOUNTER — Other Ambulatory Visit: Payer: Self-pay | Admitting: *Deleted

## 2022-08-30 ENCOUNTER — Telehealth: Payer: Self-pay | Admitting: Nurse Practitioner

## 2022-08-30 DIAGNOSIS — D2272 Melanocytic nevi of left lower limb, including hip: Secondary | ICD-10-CM | POA: Diagnosis not present

## 2022-08-30 DIAGNOSIS — D225 Melanocytic nevi of trunk: Secondary | ICD-10-CM | POA: Diagnosis not present

## 2022-08-30 DIAGNOSIS — D2271 Melanocytic nevi of right lower limb, including hip: Secondary | ICD-10-CM | POA: Diagnosis not present

## 2022-08-30 DIAGNOSIS — L661 Lichen planopilaris: Secondary | ICD-10-CM | POA: Diagnosis not present

## 2022-08-30 DIAGNOSIS — L821 Other seborrheic keratosis: Secondary | ICD-10-CM | POA: Diagnosis not present

## 2022-08-30 DIAGNOSIS — D2262 Melanocytic nevi of left upper limb, including shoulder: Secondary | ICD-10-CM | POA: Diagnosis not present

## 2022-08-30 DIAGNOSIS — D2261 Melanocytic nevi of right upper limb, including shoulder: Secondary | ICD-10-CM | POA: Diagnosis not present

## 2022-08-30 DIAGNOSIS — M810 Age-related osteoporosis without current pathological fracture: Secondary | ICD-10-CM

## 2022-08-30 MED ORDER — PROLIA 60 MG/ML ~~LOC~~ SOSY: 60.0000 mg | PREFILLED_SYRINGE | SUBCUTANEOUS | 1 refills | Status: DC

## 2022-08-30 NOTE — Telephone Encounter (Signed)
I spoke with pharmacy. They needed to clarify the dose sent in. It should have been 60 mg with 1 refill. I will make the change on the med list.  Medication will be shipped here &pt will need to schedule a nurse visit to receive injection. She will be responsible for a 20% admin fee. We will bill the first one to see how much she will be responsible for.

## 2022-08-30 NOTE — Telephone Encounter (Signed)
Pt called stating the pharmacy want the provider to call them to clarify a prolia shot [phone]-[facility]

## 2022-09-17 NOTE — Telephone Encounter (Signed)
1) Pt is overdue for Vit D lab (on Prolia)  2) Spoke with [facility] & was notified that patient wants to get the Prolia injection at nursing home.  I advised them that it has to be done in a office setting.  Pt was notified & will call the office to schedule her Prolia & non-fasting lab on 7/16.  Medication should be received from [facility] around on 09/19/22

## 2022-09-17 NOTE — Addendum Note (Signed)
Addended by: Warden Fillers on: 09/17/2022 12:18 PM   Modules accepted: Orders

## 2022-09-18 ENCOUNTER — Other Ambulatory Visit (INDEPENDENT_AMBULATORY_CARE_PROVIDER_SITE_OTHER): Payer: PPO

## 2022-09-18 DIAGNOSIS — M810 Age-related osteoporosis without current pathological fracture: Secondary | ICD-10-CM | POA: Diagnosis not present

## 2022-09-18 LAB — COMPREHENSIVE METABOLIC PANEL
ALT: 22 U/L (ref 0–35)
AST: 31 U/L (ref 0–37)
Albumin: 4.7 g/dL (ref 3.5–5.2)
Alkaline Phosphatase: 42 U/L (ref 39–117)
BUN: 17 mg/dL (ref 6–23)
CO2: 25 mEq/L (ref 19–32)
Calcium: 9.8 mg/dL (ref 8.4–10.5)
Chloride: 98 mEq/L (ref 96–112)
Creatinine, Ser: 0.71 mg/dL (ref 0.40–1.20)
GFR: 85.69 mL/min (ref >60.00)
Glucose, Bld: 96 mg/dL (ref 70–99)
Potassium: 4 mEq/L (ref 3.5–5.1)
Sodium: 134 mEq/L — ABNORMAL LOW (ref 135–145)
Total Bilirubin: 0.7 mg/dL (ref 0.2–1.2)
Total Protein: 6.8 g/dL (ref 6.0–8.3)

## 2022-09-18 LAB — VITAMIN D 25 HYDROXY (VIT D DEFICIENCY, FRACTURES): VITD: 45.66 ng/mL (ref 30.00–100.00)

## 2022-09-19 ENCOUNTER — Ambulatory Visit (INDEPENDENT_AMBULATORY_CARE_PROVIDER_SITE_OTHER): Payer: PPO

## 2022-09-19 DIAGNOSIS — M810 Age-related osteoporosis without current pathological fracture: Secondary | ICD-10-CM | POA: Diagnosis not present

## 2022-09-19 MED ORDER — DENOSUMAB 60 MG/ML ~~LOC~~ SOSY
60.0000 mg | PREFILLED_SYRINGE | Freq: Once | SUBCUTANEOUS | Status: AC
Start: 2022-09-19 — End: 2022-09-19
  Administered 2022-09-19: 60 mg via SUBCUTANEOUS

## 2022-09-19 NOTE — Progress Notes (Signed)
Patient arrived for a Prolia injection and it was administered into her Left upper arm subcutaneous. Patient tolerated the Prolia injection well and did not show any signs of distress or voice any concerns.

## 2022-10-24 ENCOUNTER — Telehealth: Payer: Self-pay | Admitting: Nurse Practitioner

## 2022-10-24 NOTE — Telephone Encounter (Signed)
Patient dropped off document  request for medication refill , to be filled out by provider. Document is located in providers tray at front office.Please advise at [facility] [phone]

## 2022-10-29 ENCOUNTER — Other Ambulatory Visit: Payer: Self-pay | Admitting: Nurse Practitioner

## 2022-10-29 DIAGNOSIS — J3489 Other specified disorders of nose and nasal sinuses: Secondary | ICD-10-CM

## 2022-10-29 MED ORDER — IPRATROPIUM BROMIDE 0.06 % NA SOLN
2.0000 | Freq: Three times a day (TID) | NASAL | 3 refills | Status: DC
Start: 2022-10-29 — End: 2023-05-14

## 2022-10-29 NOTE — Telephone Encounter (Signed)
Detailed VM left informing pt

## 2022-12-20 DIAGNOSIS — H2513 Age-related nuclear cataract, bilateral: Secondary | ICD-10-CM | POA: Diagnosis not present

## 2022-12-20 DIAGNOSIS — H2512 Age-related nuclear cataract, left eye: Secondary | ICD-10-CM | POA: Diagnosis not present

## 2022-12-20 DIAGNOSIS — H3509 Other intraretinal microvascular abnormalities: Secondary | ICD-10-CM | POA: Diagnosis not present

## 2022-12-20 DIAGNOSIS — H35411 Lattice degeneration of retina, right eye: Secondary | ICD-10-CM | POA: Diagnosis not present

## 2022-12-20 DIAGNOSIS — D3132 Benign neoplasm of left choroid: Secondary | ICD-10-CM | POA: Diagnosis not present

## 2022-12-23 ENCOUNTER — Other Ambulatory Visit: Payer: Self-pay | Admitting: Nurse Practitioner

## 2022-12-23 DIAGNOSIS — E039 Hypothyroidism, unspecified: Secondary | ICD-10-CM

## 2023-01-02 ENCOUNTER — Encounter: Payer: Self-pay | Admitting: Nurse Practitioner

## 2023-01-02 ENCOUNTER — Ambulatory Visit (INDEPENDENT_AMBULATORY_CARE_PROVIDER_SITE_OTHER): Payer: PPO | Admitting: Nurse Practitioner

## 2023-01-02 VITALS — BP 110/60 | HR 73 | Temp 98.5°F | Ht 65.5 in | Wt 136.6 lb

## 2023-01-02 DIAGNOSIS — E559 Vitamin D deficiency, unspecified: Secondary | ICD-10-CM

## 2023-01-02 DIAGNOSIS — I10 Essential (primary) hypertension: Secondary | ICD-10-CM | POA: Diagnosis not present

## 2023-01-02 DIAGNOSIS — F419 Anxiety disorder, unspecified: Secondary | ICD-10-CM | POA: Diagnosis not present

## 2023-01-02 DIAGNOSIS — E039 Hypothyroidism, unspecified: Secondary | ICD-10-CM | POA: Diagnosis not present

## 2023-01-02 DIAGNOSIS — F330 Major depressive disorder, recurrent, mild: Secondary | ICD-10-CM | POA: Diagnosis not present

## 2023-01-02 DIAGNOSIS — Z792 Long term (current) use of antibiotics: Secondary | ICD-10-CM | POA: Diagnosis not present

## 2023-01-02 DIAGNOSIS — E7800 Pure hypercholesterolemia, unspecified: Secondary | ICD-10-CM | POA: Diagnosis not present

## 2023-01-02 MED ORDER — LEVOTHYROXINE SODIUM 75 MCG PO TABS: 75.0000 ug | ORAL_TABLET | Freq: Every day | ORAL | 3 refills | Status: DC

## 2023-01-02 MED ORDER — LISINOPRIL 20 MG PO TABS: 20.0000 mg | ORAL_TABLET | Freq: Two times a day (BID) | ORAL | 3 refills | Status: DC

## 2023-01-02 MED ORDER — AMOXICILLIN 500 MG PO CAPS: 2000.0000 mg | ORAL_CAPSULE | Freq: Once | ORAL | 0 refills | Status: AC | PRN

## 2023-01-02 MED ORDER — PRAVASTATIN SODIUM 40 MG PO TABS: 40.0000 mg | ORAL_TABLET | Freq: Every day | ORAL | 3 refills | Status: DC

## 2023-01-02 MED ORDER — LORAZEPAM 1 MG PO TABS: ORAL_TABLET | ORAL | 5 refills | Status: DC

## 2023-01-02 MED ORDER — AMLODIPINE BESYLATE 5 MG PO TABS: 5.0000 mg | ORAL_TABLET | Freq: Two times a day (BID) | ORAL | 3 refills | Status: DC

## 2023-01-02 MED ORDER — BUPROPION HCL 75 MG PO TABS: 75.0000 mg | ORAL_TABLET | Freq: Two times a day (BID) | ORAL | 1 refills | Status: DC

## 2023-01-02 NOTE — Assessment & Plan Note (Signed)
Chronic. Stable on Lisinopril 20 mg BID and Norvasc 5 mg BID. Patient monitors blood pressures very closely at home. She does not always take the second dose of medications, only as needed for BP > 130/80. Continue current regimen. Refills sent. Lab work as outlined.

## 2023-01-02 NOTE — Assessment & Plan Note (Signed)
Chronic. Stable on Pravastatin 40 mg daily. Continue. Refills sent. Will check lipid panel today.

## 2023-01-02 NOTE — Assessment & Plan Note (Signed)
Chronic issue. Stable on Ativan 1 mg BID PRN. Continue. Refills sent.

## 2023-01-02 NOTE — Assessment & Plan Note (Signed)
Chronic. Stable on Levothyroxine 75 mcg daily. Continue. Will check TSH today.

## 2023-01-02 NOTE — Assessment & Plan Note (Signed)
Chronic issue. Taking OTC supplement. Continue. Will check vitamin D level today.

## 2023-01-02 NOTE — Progress Notes (Signed)
Bethanie Dicker, NP-C Phone: [phone]  Helen Snow is a 72 y.o. female who presents today for follow up.   HYPERTENSION Disease Monitoring Home BP Monitoring- 100-120s/60s Chest pain- No    Dyspnea- No Medications Compliance-  Norvasc and Lisinopril   Lightheadedness-  No  Edema- No BMET    Component Value Date/Time   NA 134 (L) 09/18/2022 1057   K 4.0 09/18/2022 1057   CL 98 09/18/2022 1057   CO2 25 09/18/2022 1057   GLUCOSE 96 09/18/2022 1057   BUN 17 09/18/2022 1057   CREATININE 0.71 09/18/2022 1057   CALCIUM 9.8 09/18/2022 1057   GFRNONAA >60 01/18/2020 1032   GFRAA >60 02/26/2018 1410   HYPERLIPIDEMIA Symptoms Chest pain on exertion:  No   Leg claudication:   No Medications: Compliance- Pravastatin Right upper quadrant pain- No  Muscle aches- No Lipid Panel     Component Value Date/Time   CHOL 150 06/11/2021 0926   TRIG 55.0 06/11/2021 0926   HDL 79.70 06/11/2021 0926   CHOLHDL 2 06/11/2021 0926   VLDL 11.0 06/11/2021 0926   LDLCALC 59 06/11/2021 0926   HYPOTHYROIDISM Disease Monitoring Weight changes: No  Skin Changes: No Palpitations: No Heat/Cold intolerance: No Medication Monitoring Compliance:  Levothyroxine   Last TSH:   Lab Results  Component Value Date   TSH 1.56 12/14/2021    Social History   Tobacco Use  Smoking Status Never  Smokeless Tobacco Never    Current Outpatient Medications on File Prior to Visit  Medication Sig Dispense Refill   ascorbic acid (VITAMIN C) 500 MG tablet Take 500 mg by mouth 2 (two) times daily.     Calcium Carbonate-Vit D-Min (CALCIUM 1200 PO) Take 1 tablet by mouth daily.      denosumab (PROLIA) 60 MG/ML SOSY injection Inject 60 mg into the skin every 6 (six) months. 60 mL 1   finasteride (PROSCAR) 5 MG tablet Take 5 mg by mouth daily.     ipratropium (ATROVENT) 0.06 % nasal spray Place 2 sprays into both nostrils 3 (three) times daily. 45 mL 3   Menatetrenone (VITAMIN K2) 100 MCG TABS Take 100 mcg by  mouth daily.     minoxidil (LONITEN) 2.5 MG tablet Take 1.25 mg by mouth daily.     Multiple Vitamin (MULTIVITAMIN WITH MINERALS) TABS tablet Take 1 tablet by mouth daily.     No current facility-administered medications on file prior to visit.    ROS see history of present illness  Objective  Physical Exam Vitals:   01/02/23 1438  BP: 110/60  Pulse: 73  Temp: 98.5 F (36.9 C)  SpO2: 98%    BP Readings from Last 3 Encounters:  01/02/23 110/60  08/28/22 116/60  07/31/22 116/62   Wt Readings from Last 3 Encounters:  01/02/23 136 lb 9.6 oz (62 kg)  08/28/22 135 lb 6.4 oz (61.4 kg)  07/31/22 136 lb 6.4 oz (61.9 kg)    Physical Exam Constitutional:      General: She is not in acute distress.    Appearance: Normal appearance.  HENT:     Head: Normocephalic.  Cardiovascular:     Rate and Rhythm: Normal rate and regular rhythm.     Heart sounds: Normal heart sounds.  Pulmonary:     Effort: Pulmonary effort is normal.     Breath sounds: Normal breath sounds.  Skin:    General: Skin is warm and dry.  Neurological:     General: No focal deficit present.  Mental Status: She is alert.  Psychiatric:        Mood and Affect: Mood normal.        Behavior: Behavior normal.    Assessment/Plan: Please see individual problem list.  Essential hypertension Assessment & Plan: Chronic. Stable on Lisinopril 20 mg BID and Norvasc 5 mg BID. Patient monitors blood pressures very closely at home. She does not always take the second dose of medications, only as needed for BP > 130/80. Continue current regimen. Refills sent. Lab work as outlined.   Orders: -     CBC with Differential/Platelet -     Comprehensive metabolic panel -     amLODIPine Besylate; Take 1 tablet (5 mg total) by mouth in the morning and at bedtime. If BP >130/>80  Dispense: 200 tablet; Refill: 3 -     Lisinopril; Take 1 tablet (20 mg total) by mouth in the morning and at bedtime. If BP>130/>80 take a dose hold  if  BP <90-100/<60. D/c 10  Dispense: 200 tablet; Refill: 3  Pure hypercholesterolemia Assessment & Plan: Chronic. Stable on Pravastatin 40 mg daily. Continue. Refills sent. Will check lipid panel today.   Orders: -     Lipid panel -     Pravastatin Sodium; Take 1 tablet (40 mg total) by mouth at bedtime.  Dispense: 100 tablet; Refill: 3  Hypothyroidism, unspecified type Assessment & Plan: Chronic. Stable on Levothyroxine 75 mcg daily. Continue. Will check TSH today.   Orders: -     TSH -     Levothyroxine Sodium; Take 1 tablet (75 mcg total) by mouth daily before breakfast. 30 minutes before food. Do not take with vitamins wait 4 hours after medication before vitamins  Dispense: 100 tablet; Refill: 3  Mild episode of recurrent major depressive disorder [facility]) Assessment & Plan: Continues to have difficulty with depression. Not on any medications. Liked Wellbutrin XL however caused hair loss. She is willing to try Wellbutrin SR 75 mg BID to see if she experiences the same side effects. Medication sent to pharmacy. She will stop the medication if she begins to have adverse side effects. Encouraged to contact if worsening symptoms, unusual behavior changes or suicidal thoughts occur. Will continue to monitor.   Orders: -     buPROPion HCl; Take 1 tablet (75 mg total) by mouth 2 (two) times daily.  Dispense: 200 tablet; Refill: 1  Anxiety Assessment & Plan: Chronic issue. Stable on Ativan 1 mg BID PRN. Continue. Refills sent.   Orders: -     LORazepam; TAKE 1 TABLET (1 MG TOTAL) BY MOUTH 2 (TWO) TIMES DAILY AS NEEDED FOR ANXIETY.  Dispense: 60 tablet; Refill: 5  Vitamin D deficiency Assessment & Plan: Chronic issue. Taking OTC supplement. Continue. Will check vitamin D level today.   Orders: -     VITAMIN D 25 Hydroxy (Vit-D Deficiency, Fractures)  Need for antibiotic prophylaxis for dental procedure -     Amoxicillin; Take 4 capsules (2,000 mg total) by mouth once as needed for  up to 1 dose. 1 hour with food before dental procedure due to hip replacement  Dispense: 20 capsule; Refill: 0    Return in about 3 months (around 04/04/2023) for Follow up.   Bethanie Dicker, NP-C [facility] - [employer]

## 2023-01-02 NOTE — Assessment & Plan Note (Signed)
Continues to have difficulty with depression. Not on any medications. Liked Wellbutrin XL however caused hair loss. She is willing to try Wellbutrin SR 75 mg BID to see if she experiences the same side effects. Medication sent to pharmacy. She will stop the medication if she begins to have adverse side effects. Encouraged to contact if worsening symptoms, unusual behavior changes or suicidal thoughts occur. Will continue to monitor.

## 2023-01-03 DIAGNOSIS — L6611 Classic lichen planopilaris: Secondary | ICD-10-CM | POA: Diagnosis not present

## 2023-01-03 LAB — CBC WITH DIFFERENTIAL/PLATELET
Basophils Absolute: 0 10*3/uL (ref 0.0–0.1)
Basophils Relative: 0.4 % (ref 0.0–3.0)
Eosinophils Absolute: 0.1 10*3/uL (ref 0.0–0.7)
Eosinophils Relative: 1.4 % (ref 0.0–5.0)
HCT: 37.3 % (ref 36.0–46.0)
Hemoglobin: 12.7 g/dL (ref 12.0–15.0)
Lymphocytes Relative: 14.7 % (ref 12.0–46.0)
Lymphs Abs: 1.1 10*3/uL (ref 0.7–4.0)
MCHC: 33.9 g/dL (ref 30.0–36.0)
MCV: 96.3 fL (ref 78.0–100.0)
Monocytes Absolute: 0.7 10*3/uL (ref 0.1–1.0)
Monocytes Relative: 8.9 % (ref 3.0–12.0)
Neutro Abs: 5.7 10*3/uL (ref 1.4–7.7)
Neutrophils Relative %: 74.6 % (ref 43.0–77.0)
Platelets: 220 10*3/uL (ref 150.0–400.0)
RBC: 3.87 Mil/uL (ref 3.87–5.11)
RDW: 12 % (ref 11.5–15.5)
WBC: 7.7 10*3/uL (ref 4.0–10.5)

## 2023-01-03 LAB — COMPREHENSIVE METABOLIC PANEL
ALT: 21 U/L (ref 0–35)
AST: 28 U/L (ref 0–37)
Albumin: 4.4 g/dL (ref 3.5–5.2)
Alkaline Phosphatase: 39 U/L (ref 39–117)
BUN: 12 mg/dL (ref 6–23)
CO2: 28 meq/L (ref 19–32)
Calcium: 9.5 mg/dL (ref 8.4–10.5)
Chloride: 98 meq/L (ref 96–112)
Creatinine, Ser: 0.83 mg/dL (ref 0.40–1.20)
GFR: 70.9 mL/min (ref >60.00)
Glucose, Bld: 120 mg/dL — ABNORMAL HIGH (ref 70–99)
Potassium: 5 meq/L (ref 3.5–5.1)
Sodium: 135 meq/L (ref 135–145)
Total Bilirubin: 0.5 mg/dL (ref 0.2–1.2)
Total Protein: 6.4 g/dL (ref 6.0–8.3)

## 2023-01-03 LAB — LIPID PANEL
Cholesterol: 177 mg/dL (ref 0–200)
HDL: 79.6 mg/dL (ref >39.00)
LDL Cholesterol: 84 mg/dL (ref 0–99)
NonHDL: 97.89
Total CHOL/HDL Ratio: 2
Triglycerides: 70 mg/dL (ref 0.0–149.0)
VLDL: 14 mg/dL (ref 0.0–40.0)

## 2023-01-03 LAB — VITAMIN D 25 HYDROXY (VIT D DEFICIENCY, FRACTURES): VITD: 47.38 ng/mL (ref 30.00–100.00)

## 2023-01-03 LAB — TSH: TSH: 1.45 u[IU]/mL (ref 0.35–5.50)

## 2023-03-25 ENCOUNTER — Other Ambulatory Visit: Payer: Self-pay | Admitting: *Deleted

## 2023-03-25 ENCOUNTER — Telehealth: Payer: Self-pay | Admitting: *Deleted

## 2023-03-25 DIAGNOSIS — M810 Age-related osteoporosis without current pathological fracture: Secondary | ICD-10-CM

## 2023-03-25 MED ORDER — PROLIA 60 MG/ML ~~LOC~~ SOSY: 60.0000 mg | PREFILLED_SYRINGE | SUBCUTANEOUS | 1 refills | Status: DC

## 2023-03-25 MED ORDER — DENOSUMAB 60 MG/ML ~~LOC~~ SOSY
60.0000 mg | PREFILLED_SYRINGE | Freq: Once | SUBCUTANEOUS | Status: AC
  Administered 2023-04-04: 60 mg via SUBCUTANEOUS

## 2023-03-25 NOTE — Telephone Encounter (Signed)
 Pt due for Prolia-gets medication from [facility].   Please advise what the 20% admin fee will be this year for [employer] insurance

## 2023-03-25 NOTE — Telephone Encounter (Signed)
 Spoke to patient & [facility]. Requested Rx be sent to [facility]. Rx has been sent and I faxed a form to [facility] to DC shipments.  Pt scheduled fro OV with PCP on 1/24 & will bring 'pt supplied medication in for her next injection at that time.

## 2023-04-04 ENCOUNTER — Encounter: Payer: Self-pay | Admitting: Nurse Practitioner

## 2023-04-04 ENCOUNTER — Ambulatory Visit (INDEPENDENT_AMBULATORY_CARE_PROVIDER_SITE_OTHER): Payer: PPO | Admitting: Nurse Practitioner

## 2023-04-04 VITALS — BP 100/62 | HR 80 | Temp 98.0°F | Ht 65.5 in | Wt 132.8 lb

## 2023-04-04 DIAGNOSIS — F419 Anxiety disorder, unspecified: Secondary | ICD-10-CM | POA: Diagnosis not present

## 2023-04-04 DIAGNOSIS — L661 Lichen planopilaris, unspecified: Secondary | ICD-10-CM | POA: Diagnosis not present

## 2023-04-04 DIAGNOSIS — M810 Age-related osteoporosis without current pathological fracture: Secondary | ICD-10-CM

## 2023-04-04 DIAGNOSIS — F330 Major depressive disorder, recurrent, mild: Secondary | ICD-10-CM

## 2023-04-04 MED ORDER — DENOSUMAB 60 MG/ML ~~LOC~~ SOSY: 60.0000 mg | PREFILLED_SYRINGE | SUBCUTANEOUS | Status: DC

## 2023-04-04 MED ORDER — VENLAFAXINE HCL ER 37.5 MG PO CP24: 37.5000 mg | ORAL_CAPSULE | Freq: Every day | ORAL | 2 refills | Status: DC

## 2023-04-04 NOTE — Progress Notes (Signed)
Bethanie Dicker, NP-C Phone: [phone]  Helen Snow is a 73 y.o. female who presents today for follow up.   Discussed the use of AI scribe software for clinical note transcription with the patient, who gave verbal consent to proceed.  History of Present Illness   The patient, with a history of mood disorders and hair loss, presents for a three-month follow-up. She reports discontinuing Wellbutrin due to concerns about hair loss, a side effect that she found particularly distressing. She continues taking her lorazepam, which she reports makes her slightly sleepy.   The patient has been actively managing her hair loss with a compounded anti-inflammatory, minoxidil, and finasteride. She also receives injections in her scalp during dermatology visits. Despite these efforts, she reports worsening hair loss at the crown of her head, which she finds distressing.  The patient's mood appears to be significantly affected by a recent move to a new living situation, which she describes as a "concrete box" on the fourth floor. She expresses feelings of confinement and institutionalization, and she reports that the move was not her choice. She also mentions the financial stress of increasing costs at her new residence. Despite these challenges, she continues to work from home, which she finds helpful but occasionally overwhelming.  The patient has considered wearing a wig to manage her hair loss but has not yet decided to do so. She expresses concern about the loss of freedom associated with wearing a wig and the potential impact on her mood. She also mentions the financial cost of maintaining her current hair care regimen.  The patient is open to trying a new medication for mood management and expresses a desire to improve her mental health. She also expresses concern about potential weight gain as a side effect of the new medication.      Social History   Tobacco Use  Smoking Status Never   Smokeless Tobacco Never    Current Outpatient Medications on File Prior to Visit  Medication Sig Dispense Refill   amLODipine (NORVASC) 5 MG tablet Take 1 tablet (5 mg total) by mouth in the morning and at bedtime. If BP >130/>80 200 tablet 3   amoxicillin (AMOXIL) 500 MG capsule Take 4 capsules (2,000 mg total) by mouth once as needed for up to 1 dose. 1 hour with food before dental procedure due to hip replacement 20 capsule 0   ascorbic acid (VITAMIN C) 500 MG tablet Take 500 mg by mouth 2 (two) times daily.     buPROPion (WELLBUTRIN) 75 MG tablet Take 1 tablet (75 mg total) by mouth 2 (two) times daily. 200 tablet 1   Calcium Carbonate-Vit D-Min (CALCIUM 1200 PO) Take 1 tablet by mouth daily.      denosumab (PROLIA) 60 MG/ML SOSY injection Inject 60 mg into the skin every 6 (six) months. 60 mL 1   finasteride (PROSCAR) 5 MG tablet Take 5 mg by mouth daily.     ipratropium (ATROVENT) 0.06 % nasal spray Place 2 sprays into both nostrils 3 (three) times daily. 45 mL 3   levothyroxine (SYNTHROID) 75 MCG tablet Take 1 tablet (75 mcg total) by mouth daily before breakfast. 30 minutes before food. Do not take with vitamins wait 4 hours after medication before vitamins 100 tablet 3   lisinopril (ZESTRIL) 20 MG tablet Take 1 tablet (20 mg total) by mouth in the morning and at bedtime. If BP>130/>80 take a dose hold if  BP <90-100/<60. D/c 10 200 tablet 3   LORazepam (  ATIVAN) 1 MG tablet TAKE 1 TABLET (1 MG TOTAL) BY MOUTH 2 (TWO) TIMES DAILY AS NEEDED FOR ANXIETY. 60 tablet 5   Menatetrenone (VITAMIN K2) 100 MCG TABS Take 100 mcg by mouth daily.     minoxidil (LONITEN) 2.5 MG tablet Take 1.25 mg by mouth daily.     Multiple Vitamin (MULTIVITAMIN WITH MINERALS) TABS tablet Take 1 tablet by mouth daily.     pravastatin (PRAVACHOL) 40 MG tablet Take 1 tablet (40 mg total) by mouth at bedtime. 100 tablet 3   No current facility-administered medications on file prior to visit.     ROS see history of  present illness  Objective  Physical Exam Vitals:   04/04/23 1318  BP: 100/62  Pulse: 80  Temp: 98 F (36.7 C)  SpO2: 100%    BP Readings from Last 3 Encounters:  04/04/23 100/62  01/02/23 110/60  08/28/22 116/60   Wt Readings from Last 3 Encounters:  04/04/23 132 lb 12.8 oz (60.2 kg)  01/02/23 136 lb 9.6 oz (62 kg)  08/28/22 135 lb 6.4 oz (61.4 kg)    Physical Exam Constitutional:      General: She is not in acute distress.    Appearance: Normal appearance.  HENT:     Head: Normocephalic.  Cardiovascular:     Rate and Rhythm: Normal rate and regular rhythm.     Heart sounds: Normal heart sounds.  Pulmonary:     Effort: Pulmonary effort is normal.     Breath sounds: Normal breath sounds.  Skin:    General: Skin is warm and dry.  Neurological:     General: No focal deficit present.     Mental Status: She is alert.  Psychiatric:        Mood and Affect: Mood is depressed. Affect is tearful.        Behavior: Behavior normal.    Assessment/Plan: Please see individual problem list.  Mild episode of recurrent major depressive disorder (HCC) Assessment & Plan: Wellbutrin XL was discontinued due to hair loss, she did not attempt to try Wellbutrin SR at a lower dosage as she was fearful of the side effects. After discussing options, a trial of Effexor XR was agreed upon. Start Effexor XR 37.5mg  daily and monitor for side effects and efficacy. Counseled patient on common side effects. Encouraged to contact if worsening symptoms, unusual behavior changes or suicidal thoughts occur.   Orders: -     Venlafaxine HCl ER; Take 1 capsule (37.5 mg total) by mouth daily with breakfast.  Dispense: 30 capsule; Refill: 2  Anxiety Assessment & Plan: Chronic issue. Stable on Ativan 1 mg twice daily as needed. Continue.   Orders: -     Venlafaxine HCl ER; Take 1 capsule (37.5 mg total) by mouth daily with breakfast.  Dispense: 30 capsule; Refill: 2  Lichen  planopilaris Assessment & Plan: Despite using a compounded anti-inflammatory, Minoxidil, Finasteride, and receiving injections from a dermatologist, hair loss is progressing and significantly impacting mood. Continue current hair loss treatments and consider consulting a wig specialist for additional options. Follow up with Dermatology as scheduled.    Osteoporosis, unspecified osteoporosis type, unspecified pathological fracture presence Assessment & Plan: Prolia today.   Orders: -     Denosumab    Return in about 3 months (around 07/03/2023) for Anxiety/Depression.   Bethanie Dicker, NP-C [facility]

## 2023-04-04 NOTE — Assessment & Plan Note (Signed)
Despite using a compounded anti-inflammatory, Minoxidil, Finasteride, and receiving injections from a dermatologist, hair loss is progressing and significantly impacting mood. Continue current hair loss treatments and consider consulting a wig specialist for additional options. Follow up with Dermatology as scheduled.

## 2023-04-04 NOTE — Assessment & Plan Note (Signed)
Wellbutrin XL was discontinued due to hair loss, she did not attempt to try Wellbutrin SR at a lower dosage as she was fearful of the side effects. After discussing options, a trial of Effexor XR was agreed upon. Start Effexor XR 37.5mg  daily and monitor for side effects and efficacy. Counseled patient on common side effects. Encouraged to contact if worsening symptoms, unusual behavior changes or suicidal thoughts occur.

## 2023-04-04 NOTE — Assessment & Plan Note (Addendum)
Chronic issue. Stable on Ativan 1 mg twice daily as needed. Continue.

## 2023-04-04 NOTE — Assessment & Plan Note (Signed)
Prolia today

## 2023-04-04 NOTE — Progress Notes (Signed)
Pt presented for their subcutaneous Prolia injection. Pt was identified through two identifiers. Pt was given the information packets about the Prolia and told to schedule their next injection 6 months out. Pt tolerated the subq injection well in the left arm.

## 2023-05-09 DIAGNOSIS — L6611 Classic lichen planopilaris: Secondary | ICD-10-CM | POA: Diagnosis not present

## 2023-05-14 ENCOUNTER — Other Ambulatory Visit: Payer: Self-pay | Admitting: Nurse Practitioner

## 2023-05-14 DIAGNOSIS — J3489 Other specified disorders of nose and nasal sinuses: Secondary | ICD-10-CM

## 2023-07-03 ENCOUNTER — Ambulatory Visit: Payer: PPO | Admitting: Nurse Practitioner

## 2023-07-03 ENCOUNTER — Encounter: Payer: Self-pay | Admitting: Nurse Practitioner

## 2023-07-03 VITALS — BP 110/60 | HR 70 | Temp 98.5°F | Ht 65.5 in | Wt 132.6 lb

## 2023-07-03 DIAGNOSIS — E7800 Pure hypercholesterolemia, unspecified: Secondary | ICD-10-CM

## 2023-07-03 DIAGNOSIS — I10 Essential (primary) hypertension: Secondary | ICD-10-CM | POA: Diagnosis not present

## 2023-07-03 DIAGNOSIS — F330 Major depressive disorder, recurrent, mild: Secondary | ICD-10-CM | POA: Diagnosis not present

## 2023-07-03 DIAGNOSIS — M810 Age-related osteoporosis without current pathological fracture: Secondary | ICD-10-CM | POA: Diagnosis not present

## 2023-07-03 DIAGNOSIS — E039 Hypothyroidism, unspecified: Secondary | ICD-10-CM | POA: Diagnosis not present

## 2023-07-03 DIAGNOSIS — Z1231 Encounter for screening mammogram for malignant neoplasm of breast: Secondary | ICD-10-CM

## 2023-07-03 DIAGNOSIS — F419 Anxiety disorder, unspecified: Secondary | ICD-10-CM | POA: Diagnosis not present

## 2023-07-03 LAB — CBC WITH DIFFERENTIAL/PLATELET
Basophils Absolute: 0 10*3/uL (ref 0.0–0.1)
Basophils Relative: 0.2 % (ref 0.0–3.0)
Eosinophils Absolute: 0.1 10*3/uL (ref 0.0–0.7)
Eosinophils Relative: 0.8 % (ref 0.0–5.0)
HCT: 37.7 % (ref 36.0–46.0)
Hemoglobin: 13.2 g/dL (ref 12.0–15.0)
Lymphocytes Relative: 14.5 % (ref 12.0–46.0)
Lymphs Abs: 1 10*3/uL (ref 0.7–4.0)
MCHC: 34.9 g/dL (ref 30.0–36.0)
MCV: 96.3 fl (ref 78.0–100.0)
Monocytes Absolute: 0.7 10*3/uL (ref 0.1–1.0)
Monocytes Relative: 9.7 % (ref 3.0–12.0)
Neutro Abs: 5.2 10*3/uL (ref 1.4–7.7)
Neutrophils Relative %: 74.8 % (ref 43.0–77.0)
Platelets: 228 10*3/uL (ref 150.0–400.0)
RBC: 3.92 Mil/uL (ref 3.87–5.11)
RDW: 12.1 % (ref 11.5–15.5)
WBC: 7 10*3/uL (ref 4.0–10.5)

## 2023-07-03 LAB — COMPREHENSIVE METABOLIC PANEL WITH GFR
ALT: 20 U/L (ref 0–35)
AST: 26 U/L (ref 0–37)
Albumin: 4.7 g/dL (ref 3.5–5.2)
Alkaline Phosphatase: 33 U/L — ABNORMAL LOW (ref 39–117)
BUN: 11 mg/dL (ref 6–23)
CO2: 27 meq/L (ref 19–32)
Calcium: 9.5 mg/dL (ref 8.4–10.5)
Chloride: 97 meq/L (ref 96–112)
Creatinine, Ser: 0.78 mg/dL (ref 0.40–1.20)
GFR: 76.12 mL/min (ref >60.00)
Glucose, Bld: 93 mg/dL (ref 70–99)
Potassium: 4.4 meq/L (ref 3.5–5.1)
Sodium: 132 meq/L — ABNORMAL LOW (ref 135–145)
Total Bilirubin: 0.6 mg/dL (ref 0.2–1.2)
Total Protein: 6.7 g/dL (ref 6.0–8.3)

## 2023-07-03 LAB — TSH: TSH: 1.28 u[IU]/mL (ref 0.35–5.50)

## 2023-07-03 MED ORDER — LORAZEPAM 1 MG PO TABS: 0.5000 mg | ORAL_TABLET | Freq: Three times a day (TID) | ORAL | 5 refills | Status: DC | PRN

## 2023-07-03 NOTE — Assessment & Plan Note (Signed)
 Condition is managed with levothyroxine . She reports feeling cold but has no other symptoms. Continue levothyroxine  75 mcg daily. Check TSH today.

## 2023-07-03 NOTE — Assessment & Plan Note (Addendum)
 Managed with Ativan . Unable to tolerate antidepressants due to adverse side effect of hair loss. Continues to struggle with increased anxiety and depression. We will increase Ativan  to three times daily as needed. Encouraged to only use half a tablet. GAD- 10 today. PDMP reviewed. Refills sent. We will continue to monitor.

## 2023-07-03 NOTE — Assessment & Plan Note (Signed)
 Condition is managed with Prolia , with the next injection due at the end of July. She plans to confirm administration with the clinic. Order Prolia  for administration at the clinic and administer the injection at the end of July.

## 2023-07-03 NOTE — Assessment & Plan Note (Signed)
 Condition is managed with pravastatin  without new symptoms. Continue pravastatin  40 mg daily.

## 2023-07-03 NOTE — Assessment & Plan Note (Addendum)
 Blood pressure is well-controlled with the current regimen. No concerning symptoms are reported. Continue lisinopril  20 mg and amlodipine  5 mg once daily. Monitor blood pressure at home and use a second dose of medication if blood pressure is elevated. Patient monitors blood pressures very closely at home. She does not always take the second dose of medications, only as needed for BP > 130/80. Lab work as outlined.

## 2023-07-03 NOTE — Progress Notes (Signed)
 Bluford Burkitt, NP-C Phone: [phone]  Helen Snow is a 73 y.o. female who presents today for follow up.   Discussed the use of AI scribe software for clinical note transcription with the patient, who gave verbal consent to proceed.  History of Present Illness   Helen Snow is a 73 year old female with anxiety and hypertension who presents for a three-month follow-up.  She did not take Effexor  due to concerns about side effects, particularly hair loss. Despite believing antidepressants could be beneficial, she has decided against trying any more due to the hair issue. Ativan  is helpful for managing anxiety symptoms such as crying spells, taking half a tablet initially and a whole tablet if needed. She requests an increase in her Ativan  prescription from two to three tablets.  She mentions ongoing back issues, attributing them to degenerative changes with age. She continues to take Prolia  for bone health, with her last dose in January and plans to take the next dose in July. Her blood pressure is monitored closely at home, with readings typically low in the mornings, sometimes as low as 90s/50s, which makes her feel sluggish. Consuming a teaspoon of brown sugar helps alleviate this feeling. Her blood pressure remains stable throughout the day, occasionally rising to the 130s if she forgets her medication, but she manages it with her prescribed doses of lisinopril  20 mg and amlodipine  5 mg once daily, with an additional dose if needed.  She takes pravastatin  for cholesterol and levothyroxine  for thyroid  management. She reports feeling cold all the time but denies any heart palpitations or other thyroid -related symptoms.  She has an upcoming appointment with an ophthalmologist due to vision changes, suspecting a cataract. She currently uses monovision contacts but has been advised to try using two distance lenses with reading glasses due to blurriness in her reading eye. She also has  progressive glasses but finds them ineffective.  She acknowledges being due for a mammogram and plans to schedule it soon. She has not been sick recently and has avoided COVID-19, attributing this to her use of hand sanitizer and nasal rinses. No chest pain, shortness of breath, dizziness, swelling, or heart palpitations.      Social History   Tobacco Use  Smoking Status Never  Smokeless Tobacco Never    Current Outpatient Medications on File Prior to Visit  Medication Sig Dispense Refill   amLODipine  (NORVASC ) 5 MG tablet Take 1 tablet (5 mg total) by mouth in the morning and at bedtime. If BP >130/>80 200 tablet 3   amoxicillin  (AMOXIL ) 500 MG capsule Take 4 capsules (2,000 mg total) by mouth once as needed for up to 1 dose. 1 hour with food before dental procedure due to hip replacement 20 capsule 0   ascorbic acid  (VITAMIN C) 500 MG tablet Take 500 mg by mouth 2 (two) times daily.     Calcium  Carbonate-Vit D-Min (CALCIUM  1200 PO) Take 1 tablet by mouth daily.      denosumab  (PROLIA ) 60 MG/ML SOSY injection Inject 60 mg into the skin every 6 (six) months. 60 mL 1   finasteride  (PROSCAR ) 5 MG tablet Take 5 mg by mouth daily.     ipratropium (ATROVENT ) 0.06 % nasal spray PLACE 2 SPRAYS INTO BOTH NOSTRILS 3 (THREE) TIMES DAILY 45 mL 1   levothyroxine  (SYNTHROID ) 75 MCG tablet Take 1 tablet (75 mcg total) by mouth daily before breakfast. 30 minutes before food. Do not take with vitamins wait 4 hours after medication before vitamins  100 tablet 3   lisinopril  (ZESTRIL ) 20 MG tablet Take 1 tablet (20 mg total) by mouth in the morning and at bedtime. If BP>130/>80 take a dose hold if  BP <90-100/<60. D/c 10 200 tablet 3   Menatetrenone (VITAMIN K2 ) 100 MCG TABS Take 100 mcg by mouth daily.     minoxidil (LONITEN) 2.5 MG tablet Take 1.25 mg by mouth daily.     Multiple Vitamin (MULTIVITAMIN WITH MINERALS) TABS tablet Take 1 tablet by mouth daily.     pravastatin  (PRAVACHOL ) 40 MG tablet Take 1  tablet (40 mg total) by mouth at bedtime. 100 tablet 3   Current Facility-Administered Medications on File Prior to Visit  Medication Dose Route Frequency Provider Last Rate Last Admin   [START ON 10/02/2023] denosumab  (PROLIA ) injection 60 mg  60 mg Subcutaneous Q6 months Bluford Burkitt, NP         ROS see history of present illness  Objective  Physical Exam Vitals:   07/03/23 1303  BP: 110/60  Pulse: 70  Temp: 98.5 F (36.9 C)  SpO2: 99%    BP Readings from Last 3 Encounters:  07/03/23 110/60  04/04/23 100/62  01/02/23 110/60   Wt Readings from Last 3 Encounters:  07/03/23 132 lb 9.6 oz (60.1 kg)  04/04/23 132 lb 12.8 oz (60.2 kg)  01/02/23 136 lb 9.6 oz (62 kg)    Physical Exam Constitutional:      General: She is not in acute distress.    Appearance: Normal appearance.  HENT:     Head: Normocephalic.  Cardiovascular:     Rate and Rhythm: Normal rate and regular rhythm.     Heart sounds: Normal heart sounds.  Pulmonary:     Effort: Pulmonary effort is normal.     Breath sounds: Normal breath sounds.  Skin:    General: Skin is warm and dry.  Neurological:     General: No focal deficit present.     Mental Status: She is alert.  Psychiatric:        Mood and Affect: Mood normal.        Behavior: Behavior normal.      Assessment/Plan: Please see individual problem list.  Mild episode of recurrent major depressive disorder (HCC) Assessment & Plan: Patient did not try Effexor  XR after reading side effects. She has been unable to tolerate SSRIs or Wellbutrin  due to adverse side effects. She continues to struggle with depression however she is not interested in trying any medications at this time. She will continue Ativan  as needed for her anxiety. PHQ- 7 today. She will contact if symptoms are worsening or she decides to be on medication despite side effect of hair loss.    Anxiety Assessment & Plan: Managed with Ativan . Unable to tolerate antidepressants due  to adverse side effect of hair loss. Continues to struggle with increased anxiety and depression. We will increase Ativan  to three times daily as needed. Encouraged to only use half a tablet. GAD- 10 today. PDMP reviewed. Refills sent. We will continue to monitor.    Orders: -     LORazepam ; Take 0.5-1 tablets (0.5-1 mg total) by mouth every 8 (eight) hours as needed for anxiety.  Dispense: 90 tablet; Refill: 5  Essential hypertension Assessment & Plan: Blood pressure is well-controlled with the current regimen. No concerning symptoms are reported. Continue lisinopril  20 mg and amlodipine  5 mg once daily. Monitor blood pressure at home and use a second dose of medication if blood pressure is  elevated. Patient monitors blood pressures very closely at home. She does not always take the second dose of medications, only as needed for BP > 130/80. Lab work as outlined.   Orders: -     CBC with Differential/Platelet -     Comprehensive metabolic panel with GFR  Hypothyroidism, unspecified type Assessment & Plan: Condition is managed with levothyroxine . She reports feeling cold but has no other symptoms. Continue levothyroxine  75 mcg daily. Check TSH today.   Orders: -     TSH  Pure hypercholesterolemia Assessment & Plan: Condition is managed with pravastatin  without new symptoms. Continue pravastatin  40 mg daily.    Osteoporosis, unspecified osteoporosis type, unspecified pathological fracture presence Assessment & Plan: Condition is managed with Prolia , with the next injection due at the end of July. She plans to confirm administration with the clinic. Order Prolia  for administration at the clinic and administer the injection at the end of July.   Screening mammogram for breast cancer -     3D Screening Mammogram, Left and Right; Future    Return in about 6 months (around 01/02/2024) for Follow up.   Bluford Burkitt, NP-C [facility]

## 2023-07-03 NOTE — Assessment & Plan Note (Signed)
 Patient did not try Effexor  XR after reading side effects. She has been unable to tolerate SSRIs or Wellbutrin  due to adverse side effects. She continues to struggle with depression however she is not interested in trying any medications at this time. She will continue Ativan  as needed for her anxiety. PHQ- 7 today. She will contact if symptoms are worsening or she decides to be on medication despite side effect of hair loss.

## 2023-07-07 DIAGNOSIS — D3131 Benign neoplasm of right choroid: Secondary | ICD-10-CM | POA: Diagnosis not present

## 2023-07-07 DIAGNOSIS — H2513 Age-related nuclear cataract, bilateral: Secondary | ICD-10-CM | POA: Diagnosis not present

## 2023-07-25 ENCOUNTER — Ambulatory Visit: Payer: PPO | Admitting: *Deleted

## 2023-07-25 ENCOUNTER — Encounter: Payer: Self-pay | Admitting: Ophthalmology

## 2023-07-25 VITALS — Ht 65.0 in | Wt 132.4 lb

## 2023-07-25 DIAGNOSIS — Z0000 Encounter for general adult medical examination without abnormal findings: Secondary | ICD-10-CM | POA: Diagnosis not present

## 2023-07-25 NOTE — Anesthesia Preprocedure Evaluation (Addendum)
 Anesthesia Evaluation  Patient identified by MRN, date of birth, ID band Patient awake    Reviewed: Allergy & Precautions, H&P , NPO status , Patient's Chart, lab work & pertinent test results  Airway Mallampati: III  TM Distance: >3 FB Neck ROM: Full    Dental no notable dental hx.    Pulmonary neg pulmonary ROS, asthma    Pulmonary exam normal breath sounds clear to auscultation       Cardiovascular hypertension, Normal cardiovascular exam Rhythm:Regular Rate:Normal     Neuro/Psych  PSYCHIATRIC DISORDERS Anxiety Depression    negative neurological ROS  negative psych ROS   GI/Hepatic negative GI ROS, Neg liver ROS,,,  Endo/Other  negative endocrine ROSHypothyroidism    Renal/GU negative Renal ROS  negative genitourinary   Musculoskeletal negative musculoskeletal ROS (+) Arthritis ,    Abdominal   Peds negative pediatric ROS (+)  Hematology negative hematology ROS (+)   Anesthesia Other Findings Hypertension  Arthritis Postmenopausal  Hyperlipidemia Anxiety  Personal history of chemotherapy Personal history of radiation therapy  Asthma Hypothyroidism  Cancer (HCC) Osteoporosis  Right shoulder pain Depression   Difficult venous access    Reproductive/Obstetrics negative OB ROS                             Anesthesia Physical Anesthesia Plan  ASA: 3  Anesthesia Plan: MAC   Post-op Pain Management:    Induction: Intravenous  PONV Risk Score and Plan:   Airway Management Planned: Natural Airway and Nasal Cannula  Additional Equipment:   Intra-op Plan:   Post-operative Plan:   Informed Consent: I have reviewed the patients History and Physical, chart, labs and discussed the procedure including the risks, benefits and alternatives for the proposed anesthesia with the patient or authorized representative who has indicated his/her understanding and acceptance.      Dental Advisory Given  Plan Discussed with: Anesthesiologist, CRNA and Surgeon  Anesthesia Plan Comments: (Patient consented for risks of anesthesia including but not limited to:  - adverse reactions to medications - damage to eyes, teeth, lips or other oral mucosa - nerve damage due to positioning  - sore throat or hoarseness - Damage to heart, brain, nerves, lungs, other parts of body or loss of life  Patient voiced understanding and assent.)        Anesthesia Quick Evaluation

## 2023-07-25 NOTE — Patient Instructions (Signed)
 Helen Snow , Thank you for taking time out of your busy schedule to complete your Annual Wellness Visit with me. I enjoyed our conversation and look forward to speaking with you again next year. I, as well as your care team,  appreciate your ongoing commitment to your health goals. Please review the following plan we discussed and let me know if I can assist you in the future. Your Game plan/ To Do List    Referrals: If you haven't heard from the office you've been referred to, please reach out to them at the phone provided.  Remmeber to call and schedule your mammogram that was ordered 07/03/23 Follow up Visits: Next Medicare AWV with our clinical staff: 07/28/24 @ 9:30   Have you seen your provider in the last 6 months (3 months if uncontrolled diabetes)? Yes Next Office Visit with your provider: 01/06/24  Clinician Recommendations:  Aim for 30 minutes of exercise or brisk walking, 6-8 glasses of water, and 5 servings of fruits and vegetables each day.       This is a list of the screening recommended for you and due dates:  Health Maintenance  Topic Date Due   COVID-19 Vaccine (6 - 2024-25 season) 11/10/2022   Mammogram  04/19/2023   Flu Shot  10/10/2023   Medicare Annual Wellness Visit  07/24/2024   DTaP/Tdap/Td vaccine (2 - Td or Tdap) 10/02/2027   Pneumonia Vaccine  Completed   DEXA scan (bone density measurement)  Completed   Hepatitis C Screening  Completed   HPV Vaccine  Aged Out   Meningitis B Vaccine  Aged Out   Colon Cancer Screening  Discontinued   Zoster (Shingles) Vaccine  Discontinued    Advanced directives: (In Chart) A copy of your advanced directives are scanned into your chart should your provider ever need it. Advance Care Planning is important because it:  [x]  Makes sure you receive the medical care that is consistent with your values, goals, and preferences  [x]  It provides guidance to your family and loved ones and reduces their decisional burden about whether  or not they are making the right decisions based on your wishes.

## 2023-07-25 NOTE — Progress Notes (Signed)
 Subjective:   Helen Snow is a 73 y.o. who presents for a Medicare Wellness preventive visit.  As a reminder, Annual Wellness Visits don't include a physical exam, and some assessments may be limited, especially if this visit is performed virtually. We may recommend an in-person follow-up visit with your provider if needed.  Visit Complete: Virtual I connected with  Helen Snow on 07/25/23 by a audio enabled telemedicine application and verified that I am speaking with the correct person using two identifiers.  Patient Location: Home  Provider Location: Home Office  I discussed the limitations of evaluation and management by telemedicine. The patient expressed understanding and agreed to proceed.  Vital Signs: Because this visit was a virtual/telehealth visit, some criteria may be missing or patient reported. Any vitals not documented were not able to be obtained and vitals that have been documented are patient reported.  VideoDeclined- This patient declined Librarian, academic. Therefore the visit was completed with audio only.  Persons Participating in Visit: Patient.  AWV Questionnaire: No: Patient Medicare AWV questionnaire was not completed prior to this visit.  Cardiac Risk Factors include: advanced age (>15men, >38 women);dyslipidemia;hypertension     Objective:     Today's Vitals   07/25/23 1013  Weight: 132 lb 6 oz (60 kg)  Height: 5\' 5"  (1.651 m)   Body mass index is 22.03 kg/m.     07/25/2023   10:29 AM 07/24/2022   10:48 AM 09/14/2021   10:48 AM 01/26/2020    2:31 PM 01/26/2020    2:19 PM 01/18/2020    9:39 AM 11/11/2019   10:13 AM  Advanced Directives  Does Patient Have a Medical Advance Directive? Yes Yes Yes  Yes Yes Yes  Type of Estate agent of [place];Living will Healthcare Power of [place];Living will Healthcare Power of [place];Living will Healthcare Power of [place];Living will  Healthcare  Power of [employer] of [place];Living will  Does patient want to make changes to medical advance directive? No - Patient declined No - Guardian declined No - Patient declined No - Patient declined  No - Patient declined No - Patient declined  Copy of Healthcare Power of Attorney in Chart? Yes - validated most recent copy scanned in chart (See row information) Yes - validated most recent copy scanned in chart (See row information) Yes - validated most recent copy scanned in chart (See row information)   Yes - validated most recent copy scanned in chart (See row information) Yes - validated most recent copy scanned in chart (See row information)  Would patient like information on creating a medical advance directive?   No - Patient declined        Current Medications (verified) Outpatient Encounter Medications as of 07/25/2023  Medication Sig   amLODipine  (NORVASC ) 5 MG tablet Take 1 tablet (5 mg total) by mouth in the morning and at bedtime. If BP >130/>80   amoxicillin  (AMOXIL ) 500 MG capsule Take 4 capsules (2,000 mg total) by mouth once as needed for up to 1 dose. 1 hour with food before dental procedure due to hip replacement   ascorbic acid  (VITAMIN C) 500 MG tablet Take 500 mg by mouth 2 (two) times daily.   Calcium  Carbonate-Vit D-Min (CALCIUM  1200 PO) Take 1 tablet by mouth daily.    denosumab  (PROLIA ) 60 MG/ML SOSY injection Inject 60 mg into the skin every 6 (six) months.   finasteride  (PROSCAR ) 5 MG tablet Take 5 mg by mouth daily.  ipratropium (ATROVENT ) 0.06 % nasal spray PLACE 2 SPRAYS INTO BOTH NOSTRILS 3 (THREE) TIMES DAILY   levothyroxine  (SYNTHROID ) 75 MCG tablet Take 1 tablet (75 mcg total) by mouth daily before breakfast. 30 minutes before food. Do not take with vitamins wait 4 hours after medication before vitamins   lisinopril  (ZESTRIL ) 20 MG tablet Take 1 tablet (20 mg total) by mouth in the morning and at bedtime. If BP>130/>80 take a dose hold if  BP  <90-100/<60. D/c 10   LORazepam  (ATIVAN ) 1 MG tablet Take 0.5-1 tablets (0.5-1 mg total) by mouth every 8 (eight) hours as needed for anxiety.   Menatetrenone (VITAMIN K2 ) 100 MCG TABS Take 100 mcg by mouth daily.   minoxidil (LONITEN) 2.5 MG tablet Take 1.25 mg by mouth daily.   Multiple Vitamin (MULTIVITAMIN WITH MINERALS) TABS tablet Take 1 tablet by mouth daily.   Naltrexone HCl POWD Take by mouth. Placed in a capsule at night   pravastatin  (PRAVACHOL ) 40 MG tablet Take 1 tablet (40 mg total) by mouth at bedtime.   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 Silvestre Drum    Hyperlipidemia    Hypertension    Hypothyroidism    Osteoporosis    Personal history of chemotherapy    Personal history of radiation therapy    Postmenopausal    Right shoulder pain    right tendonitis   Past Surgical History:  Procedure Laterality Date   left tonsillar surgery     02/26/16 invasive moderate SCC   TOTAL HIP ARTHROPLASTY Right 01/26/2020   Procedure: TOTAL HIP ARTHROPLASTY;  Surgeon: Arlyne Lame, MD;  Location: [facility] ORS;  Service: Orthopedics;  Laterality: Right;   Family History  Problem Relation Age of Onset   Other Mother        legally blind   Hearing loss Mother    Heart disease Mother        MI   Hyperlipidemia Mother    Hypertension Mother    Kidney disease Mother    Cancer Father        lung smoker   Breast cancer Neg Hx    Social History   Socioeconomic History   Marital status: Widowed    Spouse name: Not on file   Number of children: Not on file   Years of education: Not on file   Highest education level: Not on file  Occupational History    Occupation: Part Time  Tobacco Use   Smoking status: Never   Smokeless tobacco: Never  Vaping Use   Vaping status: Never Used  Substance and Sexual Activity   Alcohol use: Not Currently   Drug use: Never   Sexual activity: Not Currently  Other Topics Concern   Not on file  Social History Narrative   Widowed    No kids    No siblings    Never smoker   No pets   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    Social Drivers of [employer]: Low Risk  (07/25/2023)  Overall Financial Resource Strain (CARDIA)    Difficulty of Paying Living Expenses: Not hard at all  Food Insecurity: No Food Insecurity (07/25/2023)   Hunger Vital Sign    Worried About Running Out of Food in the Last Year: Never true    Ran Out of Food in the Last Year: Never true  Transportation Needs: No Transportation Needs (07/25/2023)   PRAPARE - Administrator, Civil Service (Medical): No    Lack of Transportation (Non-Medical): No  Physical Activity: Inactive (07/25/2023)   Exercise Vital Sign    Days of Exercise per Week: 0 days    Minutes of Exercise per Session: 0 min  Stress: Stress Concern Present (07/25/2023)   [employer] of Occupational Health - Occupational Stress Questionnaire    Feeling of Stress : Very much  Social Connections: Moderately Isolated (07/25/2023)   Social Connection and Isolation Panel [NHANES]    Frequency of Communication with Friends and Family: More than three times a week    Frequency of Social Gatherings with Friends and Family: More than three times a week    Attends Religious Services: More than 4 times per year    Active Member of [employer] or Organizations: No    Attends Banker Meetings: Never    Marital Status: Widowed    Tobacco Counseling Counseling given: Not Answered    Clinical Intake:  Pre-visit preparation completed: Yes  Pain : No/denies pain      BMI - recorded: 22.03 Nutritional Status: BMI of 19-24  Normal Nutritional Risks: None Diabetes: No  No results found for: "HGBA1C"   How often do you need to have someone help you when you read instructions, pamphlets, or other written materials from your doctor or pharmacy?: 1 - Never  Interpreter Needed?: No  Information entered by :: R. Kilyn Maragh LPN   Activities of Daily Living     07/25/2023   10:14 AM  In your present state of health, do you have any difficulty performing the following activities:  Hearing? 0  Vision? 0  Comment glasses, contacts  Difficulty concentrating or making decisions? 0  Walking or climbing stairs? 0  Dressing or bathing? 0  Doing errands, shopping? 0  Preparing Food and eating ? N  Using the Toilet? N  In the past six months, have you accidently leaked urine? Y  Do you have problems with loss of bowel control? N  Managing your Medications? N  Managing your Finances? N  Housekeeping or managing your Housekeeping? N    Patient Care Team: Bluford Burkitt, NP as PCP - General (Nurse Practitioner) Glenis Langdon, MD as Referring Physician (Radiation Oncology)  Indicate any recent Medical Services you may have received from other than [facility] providers in the past year (date may be approximate).     Assessment:    This is a routine wellness examination for [place].  Hearing/Vision screen Hearing Screening - Comments:: No issues Vision Screening - Comments:: Glasses and contatcts   Goals Addressed             This Visit's Progress    Patient Stated       Wants to continue eat well       Depression Screen     07/25/2023   10:22 AM 07/03/2023    1:05 PM 04/04/2023    1:21 PM 01/02/2023    2:39 PM 07/24/2022   10:36 AM 06/28/2022    3:02 PM 03/29/2022    2:12 PM  PHQ  2/9 Scores  PHQ - 2 Score 2 3 2 2 1 2  0  PHQ- 9 Score 4 7 4 4  7      Fall Risk     07/25/2023   10:17 AM 07/24/2022   10:36 AM 06/28/2022    3:02 PM 03/29/2022     2:12 PM 12/14/2021    1:11 PM  Fall Risk   Falls in the past year? 0 0 0 0 0  Number falls in past yr: 0 0 0 0 0  Injury with Fall? 0 0 0 0 0  Risk for fall due to : No Fall Risks No Fall Risks No Fall Risks Impaired balance/gait No Fall Risks  Follow up Falls prevention discussed;Falls evaluation completed Falls evaluation completed Falls evaluation completed Falls evaluation completed Falls evaluation completed    MEDICARE RISK AT HOME:  Medicare Risk at Home Any stairs in or around the home?: No If so, are there any without handrails?: No Home free of loose throw rugs in walkways, pet beds, electrical cords, etc?: Yes Adequate lighting in your home to reduce risk of falls?: Yes Life alert?: Yes Use of a cane, walker or w/c?: No Grab bars in the bathroom?: Yes Shower chair or bench in shower?: Yes Elevated toilet seat or a handicapped toilet?: Yes  TIMED UP AND GO:  Was the test performed?  No  Cognitive Function: 6CIT completed        07/25/2023   10:29 AM 07/24/2022   10:40 AM  6CIT Screen  What Year? 0 points 0 points  What month? 0 points 0 points  What time? 0 points 0 points  Count back from 20 0 points 0 points  Months in reverse 0 points 0 points  Repeat phrase 0 points 0 points  Total Score 0 points 0 points    Immunizations Immunization History  Administered Date(s) Administered   Influenza, High Dose Seasonal PF 12/25/2016, 01/01/2018, 12/29/2018, 12/31/2019, 01/03/2021   Influenza-Unspecified 12/23/2013, 12/22/2014, 12/29/2015, 12/25/2016, 12/29/2018, 01/02/2023   PFIZER Comirnaty(Gray Top)Covid-19 Tri-Sucrose Vaccine 05/09/2020   PFIZER SARS-COV-2 Pediatric Vaccination 5-30yrs 06/04/2019, 06/29/2019   PFIZER(Purple Top)SARS-COV-2 Vaccination 06/04/2019, 06/29/2019   PPD Test 01/28/2020, 02/07/2020   Pfizer Covid-19 Vaccine Bivalent Booster 70yrs & up 11/28/2020   Pneumococcal Conjugate-13 12/22/2018, 01/20/2019   Pneumococcal Polysaccharide-23  06/26/2017   Tdap 10/01/2017    Screening Tests Health Maintenance  Topic Date Due   COVID-19 Vaccine (6 - 2024-25 season) 11/10/2022   MAMMOGRAM  04/19/2023   Medicare Annual Wellness (AWV)  07/24/2023   INFLUENZA VACCINE  10/10/2023   DTaP/Tdap/Td (2 - Td or Tdap) 10/02/2027   Pneumonia Vaccine 27+ Years old  Completed   DEXA SCAN  Completed   Hepatitis C Screening  Completed   HPV VACCINES  Aged Out   Meningococcal B Vaccine  Aged Out   Colonoscopy  Discontinued   Zoster Vaccines- Shingrix  Discontinued    Health Maintenance  Health Maintenance Due  Topic Date Due   COVID-19 Vaccine (6 - 2024-25 season) 11/10/2022   MAMMOGRAM  04/19/2023   Medicare Annual Wellness (AWV)  07/24/2023   Health Maintenance Items Addressed: Patient reminded to call and schedule her mammogram that was ordered 07/03/23. Patient declines covid vaccine. Declines shingles vaccines because she has never had chicken pox.  Additional Screening:  Vision Screening: Recommended annual ophthalmology exams for early detection of glaucoma and other disorders of the eye. Up to date [facility]  Dental Screening: Recommended annual dental exams for proper  oral hygiene  Community Resource Referral / Chronic Care Management: CRR required this visit?  No   CCM required this visit?  No   Plan:    I have personally reviewed and noted the following in the patient's chart:   Medical and social history Use of alcohol, tobacco or illicit drugs  Current medications and supplements including opioid prescriptions. Patient is not currently taking opioid prescriptions. Functional ability and status Nutritional status Physical activity Advanced directives List of other physicians Hospitalizations, surgeries, and ER visits in previous 12 months Vitals Screenings to include cognitive, depression, and falls Referrals and appointments  In addition, I have reviewed and discussed with patient certain  preventive protocols, quality metrics, and best practice recommendations. A written personalized care plan for preventive services as well as general preventive health recommendations were provided to patient.   Felicitas Horse, LPN   1/61/0960   After Visit Summary: (MyChart) Due to this being a telephonic visit, the after visit summary with patients personalized plan was offered to patient via MyChart   Notes: Nothing significant to report at this time.

## 2023-07-25 NOTE — Discharge Instructions (Signed)

## 2023-07-28 DIAGNOSIS — H2513 Age-related nuclear cataract, bilateral: Secondary | ICD-10-CM | POA: Diagnosis not present

## 2023-07-28 DIAGNOSIS — H18823 Corneal disorder due to contact lens, bilateral: Secondary | ICD-10-CM | POA: Diagnosis not present

## 2023-07-28 DIAGNOSIS — H2512 Age-related nuclear cataract, left eye: Secondary | ICD-10-CM | POA: Diagnosis not present

## 2023-07-28 DIAGNOSIS — H2511 Age-related nuclear cataract, right eye: Secondary | ICD-10-CM | POA: Diagnosis not present

## 2023-08-05 ENCOUNTER — Encounter: Payer: Self-pay | Admitting: Ophthalmology

## 2023-08-05 ENCOUNTER — Ambulatory Visit: Payer: Self-pay | Admitting: Anesthesiology

## 2023-08-05 ENCOUNTER — Ambulatory Visit
Admission: RE | Admit: 2023-08-05 | Discharge: 2023-08-05 | Disposition: A | Discharge status: Home / Self Care | Attending: Ophthalmology | Admitting: Ophthalmology

## 2023-08-05 ENCOUNTER — Other Ambulatory Visit: Payer: Self-pay

## 2023-08-05 ENCOUNTER — Encounter
Admission: RE | Disposition: A | Discharge status: Home / Self Care | Payer: Self-pay | Source: Home / Self Care | Attending: Ophthalmology

## 2023-08-05 DIAGNOSIS — M1990 Unspecified osteoarthritis, unspecified site: Secondary | ICD-10-CM | POA: Diagnosis not present

## 2023-08-05 DIAGNOSIS — Z85818 Personal history of malignant neoplasm of other sites of lip, oral cavity, and pharynx: Secondary | ICD-10-CM | POA: Diagnosis not present

## 2023-08-05 DIAGNOSIS — F419 Anxiety disorder, unspecified: Secondary | ICD-10-CM | POA: Insufficient documentation

## 2023-08-05 DIAGNOSIS — Z79890 Hormone replacement therapy (postmenopausal): Secondary | ICD-10-CM | POA: Insufficient documentation

## 2023-08-05 DIAGNOSIS — E785 Hyperlipidemia, unspecified: Secondary | ICD-10-CM | POA: Insufficient documentation

## 2023-08-05 DIAGNOSIS — E039 Hypothyroidism, unspecified: Secondary | ICD-10-CM | POA: Diagnosis not present

## 2023-08-05 DIAGNOSIS — H2511 Age-related nuclear cataract, right eye: Secondary | ICD-10-CM | POA: Diagnosis not present

## 2023-08-05 DIAGNOSIS — Z9221 Personal history of antineoplastic chemotherapy: Secondary | ICD-10-CM | POA: Diagnosis not present

## 2023-08-05 DIAGNOSIS — I10 Essential (primary) hypertension: Secondary | ICD-10-CM | POA: Insufficient documentation

## 2023-08-05 DIAGNOSIS — F32A Depression, unspecified: Secondary | ICD-10-CM | POA: Insufficient documentation

## 2023-08-05 DIAGNOSIS — Z79899 Other long term (current) drug therapy: Secondary | ICD-10-CM | POA: Insufficient documentation

## 2023-08-05 DIAGNOSIS — J45909 Unspecified asthma, uncomplicated: Secondary | ICD-10-CM | POA: Diagnosis not present

## 2023-08-05 DIAGNOSIS — Z923 Personal history of irradiation: Secondary | ICD-10-CM | POA: Diagnosis not present

## 2023-08-05 HISTORY — PX: CATARACT EXTRACTION W/PHACO: SHX586

## 2023-08-05 HISTORY — DX: Depression, unspecified: F32.A

## 2023-08-05 SURGERY — PHACOEMULSIFICATION, CATARACT, WITH IOL INSERTION
Anesthesia: Monitor Anesthesia Care | Site: Eye | Laterality: Right

## 2023-08-05 MED ORDER — MIDAZOLAM HCL 2 MG/2ML IJ SOLN
INTRAMUSCULAR | Status: DC | PRN
  Administered 2023-08-05 (×2): 1 mg via INTRAVENOUS

## 2023-08-05 MED ORDER — SODIUM CHLORIDE 0.9% FLUSH
INTRAVENOUS | Status: DC | PRN
  Administered 2023-08-05 (×2): 10 mL via INTRAVENOUS

## 2023-08-05 MED ORDER — MOXIFLOXACIN HCL 0.5 % OP SOLN
OPHTHALMIC | Status: DC | PRN
  Administered 2023-08-05: .2 mL via OPHTHALMIC

## 2023-08-05 MED ORDER — SIGHTPATH DOSE#1 NA CHONDROIT SULF-NA HYALURON 40-17 MG/ML IO SOLN
INTRAOCULAR | Status: DC | PRN
  Administered 2023-08-05: 1 mL via INTRAOCULAR

## 2023-08-05 MED ORDER — SIGHTPATH DOSE#1 BSS IO SOLN
INTRAOCULAR | Status: DC | PRN
  Administered 2023-08-05: 15 mL via INTRAOCULAR

## 2023-08-05 MED ORDER — EPINEPHRINE PF 1 MG/ML IJ SOLN
INTRAMUSCULAR | Status: DC | PRN
  Administered 2023-08-05: 42 mL via OPHTHALMIC

## 2023-08-05 MED ORDER — TETRACAINE HCL 0.5 % OP SOLN
1.0000 [drp] | OPHTHALMIC | Status: DC | PRN
Start: 2023-08-05 — End: 2023-08-05
  Administered 2023-08-05 (×3): 1 [drp] via OPHTHALMIC

## 2023-08-05 MED ORDER — LIDOCAINE HCL (PF) 2 % IJ SOLN
INTRAOCULAR | Status: DC | PRN
  Administered 2023-08-05: 2 mL

## 2023-08-05 MED ORDER — ARMC OPHTHALMIC DILATING DROPS
1.0000 | OPHTHALMIC | Status: DC | PRN
  Administered 2023-08-05 (×2): 1 via OPHTHALMIC

## 2023-08-05 MED ORDER — BRIMONIDINE TARTRATE-TIMOLOL 0.2-0.5 % OP SOLN
OPHTHALMIC | Status: DC | PRN
  Administered 2023-08-05: 1 [drp] via OPHTHALMIC

## 2023-08-05 MED ORDER — FENTANYL CITRATE (PF) 100 MCG/2ML IJ SOLN
INTRAMUSCULAR | Status: AC
  Filled 2023-08-05: qty 2

## 2023-08-05 MED ORDER — MIDAZOLAM HCL 2 MG/2ML IJ SOLN
INTRAMUSCULAR | Status: AC
Start: 2023-08-05 — End: ?
  Filled 2023-08-05: qty 2

## 2023-08-05 MED ORDER — FENTANYL CITRATE (PF) 100 MCG/2ML IJ SOLN
INTRAMUSCULAR | Status: DC | PRN
  Administered 2023-08-05 (×2): 50 ug via INTRAVENOUS

## 2023-08-05 SURGICAL SUPPLY — 12 items
CATARACT SUITE SIGHTPATH (MISCELLANEOUS) ×1 IMPLANT
CYSTOTOME ANGL RVRS SHRT 25G (CUTTER) ×1 IMPLANT
CYSTOTOME ANGL RVRS SHRT 25GA (CUTTER) ×1 IMPLANT
FEE CATARACT SUITE SIGHTPATH (MISCELLANEOUS) ×1 IMPLANT
GLOVE BIOGEL PI IND STRL 8 (GLOVE) ×1 IMPLANT
GLOVE SURG LX STRL 8.0 MICRO (GLOVE) ×1 IMPLANT
GLOVE SURG PROTEXIS BL SZ6.5 (GLOVE) ×1 IMPLANT
GLOVE SURG SYN 6.5 PF PI BL (GLOVE) ×1 IMPLANT
LENS IOL TECNIS EYHANCE 22.5 (Intraocular Lens) IMPLANT
NDL FILTER BLUNT 18X1 1/2 (NEEDLE) ×1 IMPLANT
NEEDLE FILTER BLUNT 18X1 1/2 (NEEDLE) ×1 IMPLANT
SYR 3ML LL SCALE MARK (SYRINGE) ×1 IMPLANT

## 2023-08-05 NOTE — Transfer of Care (Signed)
 Immediate Anesthesia Transfer of Care Note  Patient: Helen Snow  Procedure(s) Performed: PHACOEMULSIFICATION, CATARACT, WITH IOL INSERTION 6.96 00:40.5 (Right: Eye)  Patient Location: PACU  Anesthesia Type:MAC  Level of Consciousness: awake, alert , and oriented  Airway & Oxygen Therapy: Patient Spontanous Breathing  Post-op Assessment: Report given to RN and Post -op Vital signs reviewed and stable  Post vital signs: Reviewed and stable  Last Vitals:  Vitals Value Taken Time  BP 109/62 08/05/23 0750  Temp 36.5 C 08/05/23 0750  Pulse 65 08/05/23 0751  Resp 18 08/05/23 0751  SpO2 100 % 08/05/23 0751  Vitals shown include unfiled device data.  Last Pain:  Vitals:   08/05/23 0750  TempSrc:   PainSc: 0-No pain      Patients Stated Pain Goal: 0 (08/05/23 0650)  Complications: No notable events documented.

## 2023-08-05 NOTE — Op Note (Signed)
 PREOPERATIVE DIAGNOSIS:  Nuclear sclerotic cataract of the right eye.   POSTOPERATIVE DIAGNOSIS:  Right Eye Cataract   OPERATIVE PROCEDURE:ORPROCALL@   SURGEON:  Clair Crews, MD.   ANESTHESIA:  Anesthesiologist: Emilie Harden, MD CRNA: Karan Osgood, CRNA  1.      Managed anesthesia care. 2.      0.22ml of Shugarcaine was instilled in the eye following the paracentesis.   COMPLICATIONS:  None.   TECHNIQUE:   Stop and chop   DESCRIPTION OF PROCEDURE:  The patient was examined and consented in the preoperative holding area where the aforementioned topical anesthesia was applied to the right eye and then brought back to the Operating Room where the right eye was prepped and draped in the usual sterile ophthalmic fashion and a lid speculum was placed. A paracentesis was created with the side port blade and the anterior chamber was filled with viscoelastic. A near clear corneal incision was performed with the steel keratome. A continuous curvilinear capsulorrhexis was performed with a cystotome followed by the capsulorrhexis forceps. Hydrodissection and hydrodelineation were carried out with BSS on a blunt cannula. The lens was removed in a stop and chop  technique and the remaining cortical material was removed with the irrigation-aspiration handpiece. The capsular bag was inflated with viscoelastic and the Technis [id number]  lens was placed in the capsular bag without complication. The remaining viscoelastic was removed from the eye with the irrigation-aspiration handpiece. The wounds were hydrated. The anterior chamber was flushed with BSS and the eye was inflated to physiologic pressure. 0.1ml of Vigamox was placed in the anterior chamber. The wounds were found to be water tight. The eye was dressed with Combigan. The patient was given protective glasses to wear throughout the day and a shield with which to sleep tonight. The patient was also given drops with which to begin a drop regimen  today and will follow-up with me in one day. Implant Name Type Inv. Item Serial No. Manufacturer Lot No. LRB No. Used Action  LENS IOL TECNIS EYHANCE 22.5 - [id number] Intraocular Lens LENS IOL TECNIS EYHANCE 22.5 [id number] [employer]  Right 1 Implanted   Procedure(s): PHACOEMULSIFICATION, CATARACT, WITH IOL INSERTION 6.96 00:40.5 (Right)  Electronically signed: Clair Crews 08/05/2023 7:48 AM

## 2023-08-05 NOTE — Anesthesia Postprocedure Evaluation (Signed)
 Anesthesia Post Note  Patient: Helen Snow  Procedure(s) Performed: PHACOEMULSIFICATION, CATARACT, WITH IOL INSERTION 6.96 00:40.5 (Right: Eye)  Patient location during evaluation: PACU Anesthesia Type: MAC Level of consciousness: awake and alert Pain management: pain level controlled Vital Signs Assessment: post-procedure vital signs reviewed and stable Respiratory status: spontaneous breathing, nonlabored ventilation, respiratory function stable and patient connected to nasal cannula oxygen Cardiovascular status: stable and blood pressure returned to baseline Postop Assessment: no apparent nausea or vomiting Anesthetic complications: no   No notable events documented.   Last Vitals:  Vitals:   08/05/23 0750 08/05/23 0755  BP: 109/62 124/62  Pulse: 63 63  Resp: (!) 9 15  Temp: 36.5 C 36.5 C  SpO2: 99% 100%    Last Pain:  Vitals:   08/05/23 0755  TempSrc:   PainSc: 0-No pain                 Emilie Harden

## 2023-08-05 NOTE — H&P (Signed)
 [facility]   Primary Care Physician:  Bluford Burkitt, NP Ophthalmologist: Dr. Merrell Abate  Pre-Procedure History & Physical: HPI:  Helen Snow is a 73 y.o. female here for cataract surgery.   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 Silvestre Drum    Depression    Hyperlipidemia    Hypertension    Hypothyroidism    Osteoporosis    Personal history of chemotherapy    Personal history of radiation therapy    Postmenopausal    Right shoulder pain    right tendonitis    Past Surgical History:  Procedure Laterality Date   left tonsillar surgery     02/26/16 invasive moderate SCC   TOTAL HIP ARTHROPLASTY Right 01/26/2020   Procedure: TOTAL HIP ARTHROPLASTY;  Surgeon: Arlyne Lame, MD;  Location: [facility] ORS;  Service: Orthopedics;  Laterality: Right;    Prior to Admission medications   Medication Sig Start Date End Date Taking? Authorizing Provider  amLODipine  (NORVASC ) 5 MG tablet Take 1 tablet (5 mg total) by mouth in the morning and at bedtime. If BP >130/>80 01/02/23  Yes Bluford Burkitt, NP  amoxicillin  (AMOXIL ) 500 MG capsule Take 4 capsules (2,000 mg total) by mouth once as needed for up to 1 dose. 1 hour with food before dental procedure due to hip replacement 01/02/23  Yes Bluford Burkitt, NP  ascorbic acid  (VITAMIN C) 500 MG tablet Take 500 mg by mouth 2 (two) times daily.   Yes [provider]  Calcium  Carbonate-Vit D-Min (CALCIUM  1200 PO) Take 1 tablet by mouth daily.    Yes [provider]  denosumab  (PROLIA ) 60 MG/ML SOSY injection Inject 60 mg into the skin every 6 (six) months. 03/25/23  Yes Bluford Burkitt, NP  finasteride  (PROSCAR ) 5 MG tablet Take 5 mg by mouth daily.   Yes [provider]  ipratropium (ATROVENT ) 0.06 % nasal spray PLACE 2 SPRAYS INTO BOTH NOSTRILS 3 (THREE) TIMES DAILY 05/14/23  Yes Bluford Burkitt, NP  levothyroxine  (SYNTHROID ) 75 MCG tablet Take 1 tablet (75 mcg total) by mouth daily before breakfast. 30 minutes before food. Do not take with vitamins wait 4 hours after medication before vitamins 01/02/23  Yes Bluford Burkitt, NP  lisinopril  (ZESTRIL ) 20 MG tablet Take 1 tablet (20 mg total) by mouth in the morning and at bedtime. If BP>130/>80 take a dose hold if  BP <90-100/<60. D/c 10 01/02/23  Yes Bluford Burkitt, NP  LORazepam  (ATIVAN ) 1 MG tablet Take 0.5-1 tablets (0.5-1 mg total) by mouth every 8 (eight) hours as needed for anxiety. 07/03/23  Yes Bluford Burkitt, NP  Menatetrenone (VITAMIN K2 ) 100 MCG TABS Take 100 mcg by mouth daily.   Yes [provider]  minoxidil (LONITEN) 2.5 MG tablet Take 1.25 mg by mouth daily.   Yes [provider]  Multiple Vitamin (MULTIVITAMIN WITH MINERALS) TABS tablet Take 1 tablet by mouth daily.   Yes [provider]  Naltrexone HCl POWD Take by mouth. Placed in a capsule at night   Yes [provider]  pravastatin  (PRAVACHOL ) 40 MG tablet Take 1 tablet (40 mg total) by mouth at bedtime. 01/02/23  Yes Bluford Burkitt, NP    Allergies as of 07/11/2023 - Review Complete 07/03/2023  Allergen Reaction Noted   Pioglitazone Swelling 02/25/2014    Family History  Problem Relation Age of Onset   Other Mother        legally blind   Hearing loss Mother    Heart disease Mother        MI   Hyperlipidemia Mother    Hypertension Mother    Kidney disease Mother    Cancer Father        lung smoker   Breast cancer Neg Hx     Social History   Socioeconomic History   Marital status: Widowed    Spouse name: Not on file   Number of children: Not on file   Years of education: Not on file   Highest education level: Not on file  Occupational History   Occupation: Part Time  Tobacco Use   Smoking status: Never   Smokeless tobacco: Never  Vaping Use   Vaping status: Never Used  Substance and Sexual Activity   Alcohol  use: Not Currently   Drug use: Never   Sexual activity: Not Currently  Other Topics Concern   Not on file  Social History Narrative   Widowed    No kids    No siblings    Never smoker   No pets   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 [employer]    Social Drivers of [employer]: Low Risk  (07/25/2023)   Overall Financial Resource Strain (CARDIA)    Difficulty of Paying Living Expenses: Not hard at all  Food Insecurity: No Food Insecurity (07/25/2023)   Hunger Vital Sign    Worried About Running Out of Food in the Last Year: Never true    Ran Out of Food in the Last Year: Never true  Transportation Needs: No Transportation Needs (07/25/2023)   PRAPARE - Administrator, Civil Service (Medical): No    Lack of Transportation (Non-Medical): No  Physical Activity: Inactive (07/25/2023)   Exercise Vital Sign    Days of Exercise per Week: 0 days    Minutes of Exercise per Session: 0 min  Stress: Stress Concern Present (07/25/2023)   [employer] of Occupational Health - Occupational Stress Questionnaire    Feeling of Stress : Very much  Social Connections: Moderately Isolated (07/25/2023)   Social Connection and Isolation Panel [NHANES]    Frequency of Communication with Friends and Family: More than three times a week    Frequency of Social Gatherings with Friends and Family: More than three times a week    Attends Religious Services: More than 4 times per year    Active Member of [employer] or Organizations: No    Attends Banker Meetings: Never    Marital Status: Widowed  Intimate Partner Violence: Not At Risk (07/25/2023)   Humiliation, Afraid, Rape, and Kick questionnaire    Fear of Current or Ex-Partner: No    Emotionally Abused: No    Physically Abused: No    Sexually Abused: No    Review of Systems: See HPI, otherwise negative ROS  Physical Exam: BP (!) 146/67   Pulse 73    Temp (!) 97.4 F (36.3 C) (Temporal)   Resp 16   Ht 5\' 5"  (1.651 m)   Wt 59 kg   SpO2 99%   BMI 21.63 kg/m  General:   Alert, cooperative. Head:  Normocephalic and atraumatic. Respiratory:  Normal work of breathing. Cardiovascular:  NAD  Impression/Plan: Helen Snow is here for  cataract surgery.  Risks, benefits, limitations, and alternatives regarding cataract surgery have been reviewed with the patient.  Questions have been answered.  All parties agreeable.   Clair Crews, MD  08/05/2023, 7:18 AM

## 2023-08-06 ENCOUNTER — Encounter: Payer: Self-pay | Admitting: Ophthalmology

## 2023-08-06 DIAGNOSIS — H2512 Age-related nuclear cataract, left eye: Secondary | ICD-10-CM | POA: Diagnosis not present

## 2023-08-06 NOTE — Anesthesia Preprocedure Evaluation (Addendum)
 Anesthesia Evaluation  Patient identified by MRN, date of birth, ID band Patient awake    Reviewed: Allergy & Precautions, H&P , NPO status , Patient's Chart, lab work & pertinent test results  Airway Mallampati: III  TM Distance: >3 FB Neck ROM: Full    Dental no notable dental hx.    Pulmonary neg pulmonary ROS, asthma    Pulmonary exam normal breath sounds clear to auscultation       Cardiovascular hypertension, negative cardio ROS Normal cardiovascular exam Rhythm:Regular Rate:Normal     Neuro/Psych  PSYCHIATRIC DISORDERS Anxiety Depression    negative neurological ROS  negative psych ROS   GI/Hepatic negative GI ROS, Neg liver ROS,,,  Endo/Other  negative endocrine ROSHypothyroidism    Renal/GU negative Renal ROS  negative genitourinary   Musculoskeletal negative musculoskeletal ROS (+) Arthritis ,    Abdominal   Peds negative pediatric ROS (+)  Hematology negative hematology ROS (+)   Anesthesia Other Findings Previous cataract surgery 08-05-23 Dr. Aldo Amble   Hypertension             Arthritis Postmenopausal             Hyperlipidemia Anxiety             Personal history of chemotherapy Personal history of radiation therapy     Asthma Hypothyroidism             Cancer (HCC) Osteoporosis             Right shoulder pain Depression               Difficult venous access   Reproductive/Obstetrics negative OB ROS                             Anesthesia Physical Anesthesia Plan  ASA: 3  Anesthesia Plan: MAC   Post-op Pain Management:    Induction: Intravenous  PONV Risk Score and Plan:   Airway Management Planned: Natural Airway and Nasal Cannula  Additional Equipment:   Intra-op Plan:   Post-operative Plan:   Informed Consent: I have reviewed the patients History and Physical, chart, labs and discussed the procedure including the risks, benefits and alternatives  for the proposed anesthesia with the patient or authorized representative who has indicated his/her understanding and acceptance.     Dental Advisory Given  Plan Discussed with: Anesthesiologist, CRNA and Surgeon  Anesthesia Plan Comments: (Patient consented for risks of anesthesia including but not limited to:  - adverse reactions to medications - damage to eyes, teeth, lips or other oral mucosa - nerve damage due to positioning  - sore throat or hoarseness - Damage to heart, brain, nerves, lungs, other parts of body or loss of life  Patient voiced understanding and assent.)        Anesthesia Quick Evaluation

## 2023-08-07 ENCOUNTER — Other Ambulatory Visit: Payer: Self-pay | Admitting: Nurse Practitioner

## 2023-08-07 DIAGNOSIS — J3489 Other specified disorders of nose and nasal sinuses: Secondary | ICD-10-CM

## 2023-08-14 NOTE — Discharge Instructions (Signed)

## 2023-08-19 ENCOUNTER — Other Ambulatory Visit: Payer: Self-pay

## 2023-08-19 ENCOUNTER — Encounter
Admission: RE | Disposition: A | Discharge status: Home / Self Care | Payer: Self-pay | Source: Home / Self Care | Attending: Ophthalmology

## 2023-08-19 ENCOUNTER — Ambulatory Visit: Payer: Self-pay | Admitting: Anesthesiology

## 2023-08-19 ENCOUNTER — Encounter: Payer: Self-pay | Admitting: Ophthalmology

## 2023-08-19 ENCOUNTER — Ambulatory Visit
Admission: RE | Admit: 2023-08-19 | Discharge: 2023-08-19 | Disposition: A | Discharge status: Home / Self Care | Attending: Ophthalmology | Admitting: Ophthalmology

## 2023-08-19 DIAGNOSIS — J45909 Unspecified asthma, uncomplicated: Secondary | ICD-10-CM | POA: Diagnosis not present

## 2023-08-19 DIAGNOSIS — I10 Essential (primary) hypertension: Secondary | ICD-10-CM | POA: Diagnosis not present

## 2023-08-19 DIAGNOSIS — H2512 Age-related nuclear cataract, left eye: Secondary | ICD-10-CM | POA: Insufficient documentation

## 2023-08-19 HISTORY — PX: CATARACT EXTRACTION W/PHACO: SHX586

## 2023-08-19 SURGERY — PHACOEMULSIFICATION, CATARACT, WITH IOL INSERTION
Anesthesia: Monitor Anesthesia Care | Site: Eye | Laterality: Left

## 2023-08-19 MED ORDER — TETRACAINE HCL 0.5 % OP SOLN
OPHTHALMIC | Status: AC
  Filled 2023-08-19: qty 4

## 2023-08-19 MED ORDER — SIGHTPATH DOSE#1 BSS IO SOLN
INTRAOCULAR | Status: DC | PRN
  Administered 2023-08-19: 48 mL via OPHTHALMIC

## 2023-08-19 MED ORDER — LACTATED RINGERS IV SOLN: INTRAVENOUS | Status: DC

## 2023-08-19 MED ORDER — FENTANYL CITRATE (PF) 100 MCG/2ML IJ SOLN
INTRAMUSCULAR | Status: DC | PRN
  Administered 2023-08-19 (×2): 50 ug via INTRAVENOUS

## 2023-08-19 MED ORDER — FENTANYL CITRATE (PF) 100 MCG/2ML IJ SOLN
INTRAMUSCULAR | Status: AC
Start: 2023-08-19 — End: ?
  Filled 2023-08-19: qty 2

## 2023-08-19 MED ORDER — ARMC OPHTHALMIC DILATING DROPS
1.0000 | OPHTHALMIC | Status: DC | PRN
Start: 2023-08-19 — End: 2023-08-19
  Administered 2023-08-19 (×3): 1 via OPHTHALMIC

## 2023-08-19 MED ORDER — SIGHTPATH DOSE#1 BSS IO SOLN
INTRAOCULAR | Status: DC | PRN
  Administered 2023-08-19: 15 mL via INTRAOCULAR

## 2023-08-19 MED ORDER — MOXIFLOXACIN HCL 0.5 % OP SOLN
OPHTHALMIC | Status: DC | PRN
Start: 2023-08-19 — End: 2023-08-19
  Administered 2023-08-19: .2 mL via OPHTHALMIC

## 2023-08-19 MED ORDER — BRIMONIDINE TARTRATE-TIMOLOL 0.2-0.5 % OP SOLN
OPHTHALMIC | Status: DC | PRN
  Administered 2023-08-19: 1 [drp] via OPHTHALMIC

## 2023-08-19 MED ORDER — SIGHTPATH DOSE#1 NA CHONDROIT SULF-NA HYALURON 40-17 MG/ML IO SOLN
INTRAOCULAR | Status: DC | PRN
Start: 2023-08-19 — End: 2023-08-19
  Administered 2023-08-19: 1 mL via INTRAOCULAR

## 2023-08-19 MED ORDER — MIDAZOLAM HCL 2 MG/2ML IJ SOLN
INTRAMUSCULAR | Status: AC
Start: 2023-08-19 — End: ?
  Filled 2023-08-19: qty 2

## 2023-08-19 MED ORDER — LIDOCAINE HCL (PF) 2 % IJ SOLN
INTRAOCULAR | Status: DC | PRN
  Administered 2023-08-19: 2 mL

## 2023-08-19 MED ORDER — MIDAZOLAM HCL 2 MG/2ML IJ SOLN
INTRAMUSCULAR | Status: DC | PRN
Start: 2023-08-19 — End: 2023-08-19
  Administered 2023-08-19: 2 mg via INTRAVENOUS

## 2023-08-19 MED ORDER — ARMC OPHTHALMIC DILATING DROPS
OPHTHALMIC | Status: AC
  Filled 2023-08-19: qty 0.5

## 2023-08-19 MED ORDER — TETRACAINE HCL 0.5 % OP SOLN
1.0000 [drp] | OPHTHALMIC | Status: DC | PRN
Start: 2023-08-19 — End: 2023-08-19
  Administered 2023-08-19 (×3): 1 [drp] via OPHTHALMIC

## 2023-08-19 SURGICAL SUPPLY — 13 items
CATARACT SUITE SIGHTPATH (MISCELLANEOUS) ×1 IMPLANT
CYSTOTOME ANGL RVRS SHRT 25G (CUTTER) ×1 IMPLANT
CYSTOTOME ANGL RVRS SHRT 25GA (CUTTER) ×1 IMPLANT
FEE CATARACT SUITE SIGHTPATH (MISCELLANEOUS) ×1 IMPLANT
GAUZE SPONGE 4X4 12PLY STRL (GAUZE/BANDAGES/DRESSINGS) IMPLANT
GLOVE BIOGEL PI IND STRL 8 (GLOVE) ×1 IMPLANT
GLOVE SURG LX STRL 8.0 MICRO (GLOVE) ×1 IMPLANT
GLOVE SURG PROTEXIS BL SZ6.5 (GLOVE) ×1 IMPLANT
GLOVE SURG SYN 6.5 PF PI BL (GLOVE) ×1 IMPLANT
LENS IOL TECNIS EYHANCE 22.0 (Intraocular Lens) IMPLANT
NDL FILTER BLUNT 18X1 1/2 (NEEDLE) ×1 IMPLANT
NEEDLE FILTER BLUNT 18X1 1/2 (NEEDLE) ×1 IMPLANT
SYR 3ML LL SCALE MARK (SYRINGE) ×1 IMPLANT

## 2023-08-19 NOTE — Anesthesia Postprocedure Evaluation (Signed)
 Anesthesia Post Note  Patient: Helen Snow  Procedure(s) Performed: PHACOEMULSIFICATION, CATARACT, WITH IOL INSERTION 5.57 00:36.9 (Left: Eye)  Patient location during evaluation: PACU Anesthesia Type: MAC Level of consciousness: awake and alert Pain management: pain level controlled Vital Signs Assessment: post-procedure vital signs reviewed and stable Respiratory status: spontaneous breathing, nonlabored ventilation, respiratory function stable and patient connected to nasal cannula oxygen Cardiovascular status: stable and blood pressure returned to baseline Postop Assessment: no apparent nausea or vomiting Anesthetic complications: no   No notable events documented.   Last Vitals:  Vitals:   08/19/23 0856 08/19/23 0901  BP: 113/61 117/62  Pulse: (!) 57 (!) 58  Resp: 13 17  Temp: (!) 36.1 C (!) 36.1 C  SpO2: 100% 100%    Last Pain:  Vitals:   08/19/23 0901  TempSrc:   PainSc: 0-No pain                 Ivannah Zody C Lashara Urey

## 2023-08-19 NOTE — Op Note (Signed)
 PREOPERATIVE DIAGNOSIS:  Nuclear sclerotic cataract of the left eye.   POSTOPERATIVE DIAGNOSIS:  Nuclear sclerotic cataract of the left eye.   OPERATIVE PROCEDURE:ORPROCALL@   SURGEON:  Clair Crews, MD.   ANESTHESIA:  Anesthesiologist: Emilie Harden, MD CRNA: Sherrlyn Dolores, CRNA  1.      Managed anesthesia care. 2.     0.2ml of Shugarcaine was instilled following the paracentesis   COMPLICATIONS:  None.   TECHNIQUE:   Stop and chop   DESCRIPTION OF PROCEDURE:  The patient was examined and consented in the preoperative holding area where the aforementioned topical anesthesia was applied to the left eye and then brought back to the Operating Room where the left eye was prepped and draped in the usual sterile ophthalmic fashion and a lid speculum was placed. A paracentesis was created with the side port blade and the anterior chamber was filled with viscoelastic. A near clear corneal incision was performed with the steel keratome. A continuous curvilinear capsulorrhexis was performed with a cystotome followed by the capsulorrhexis forceps. Hydrodissection and hydrodelineation were carried out with BSS on a blunt cannula. The lens was removed in a stop and chop  technique and the remaining cortical material was removed with the irrigation-aspiration handpiece. The capsular bag was inflated with viscoelastic and the Technis [id number] lens was placed in the capsular bag without complication. The remaining viscoelastic was removed from the eye with the irrigation-aspiration handpiece. The wounds were hydrated. The anterior chamber was flushed with BSS and the eye was inflated to physiologic pressure. 0.26ml Vigamox  was placed in the anterior chamber. The wounds were found to be water tight. The eye was dressed with Combigan . The patient was given protective glasses to wear throughout the day and a shield with which to sleep tonight. The patient was also given drops with which to begin a drop regimen  today and will follow-up with me in one day. Implant Name Type Inv. Item Serial No. Manufacturer Lot No. LRB No. Used Action  LENS IOL TECNIS EYHANCE 22.0 - [id number] Intraocular Lens LENS IOL TECNIS EYHANCE 22.0 [id number] SIGHT[employer]  Left 1 Implanted    Procedure(s): PHACOEMULSIFICATION, CATARACT, WITH IOL INSERTION 5.57 00:36.9 (Left)  Electronically signed: Clair Crews 08/19/2023 8:53 AM

## 2023-08-19 NOTE — Transfer of Care (Signed)
 Immediate Anesthesia Transfer of Care Note  Patient: Helen Snow  Procedure(s) Performed: PHACOEMULSIFICATION, CATARACT, WITH IOL INSERTION 5.57 00:36.9 (Left: Eye)  Patient Location: PACU  Anesthesia Type: MAC  Level of Consciousness: awake, alert  and patient cooperative  Airway and Oxygen Therapy: Patient Spontanous Breathing and Patient connected to supplemental oxygen  Post-op Assessment: Post-op Vital signs reviewed, Patient's Cardiovascular Status Stable, Respiratory Function Stable, Patent Airway and No signs of Nausea or vomiting  Post-op Vital Signs: Reviewed and stable  Complications: No notable events documented.

## 2023-08-19 NOTE — H&P (Signed)
 [facility]   Primary Care Physician:  Bluford Burkitt, NP Ophthalmologist: Dr. Merrell Abate  Pre-Procedure History & Physical: HPI:  Helen Snow is a 73 y.o. female here for cataract surgery.   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 Silvestre Drum    Depression    Hyperlipidemia    Hypertension    Hypothyroidism    Osteoporosis    Personal history of chemotherapy    Personal history of radiation therapy    Postmenopausal    Right shoulder pain    right tendonitis    Past Surgical History:  Procedure Laterality Date   CATARACT EXTRACTION W/PHACO Right 08/05/2023   Procedure: PHACOEMULSIFICATION, CATARACT, WITH IOL INSERTION 6.96 00:40.5;  Surgeon: Clair Crews, MD;  Location: [facility];  Service: Ophthalmology;  Laterality: Right;   left tonsillar surgery     02/26/16 invasive moderate SCC   TOTAL HIP ARTHROPLASTY Right 01/26/2020   Procedure: TOTAL HIP ARTHROPLASTY;  Surgeon: Arlyne Lame, MD;  Location: [facility] ORS;  Service: Orthopedics;  Laterality: Right;    Prior to Admission medications   Medication Sig Start Date End Date Taking? Authorizing Provider  amLODipine  (NORVASC ) 5 MG tablet Take 1 tablet (5 mg total) by mouth in the morning and at bedtime. If BP >130/>80 01/02/23  Yes Bluford Burkitt, NP  amoxicillin  (AMOXIL ) 500 MG capsule Take 4 capsules (2,000 mg total) by mouth once as needed for up to 1 dose. 1 hour with food before dental procedure due to hip replacement 01/02/23  Yes Bluford Burkitt, NP  ascorbic acid  (VITAMIN C) 500 MG tablet Take 500 mg by mouth 2 (two) times daily.   Yes [provider]  Calcium  Carbonate-Vit D-Min (CALCIUM  1200 PO) Take 1 tablet by mouth daily.    Yes [provider]  denosumab  (PROLIA ) 60 MG/ML SOSY injection Inject 60 mg into the skin every 6 (six) months. 03/25/23  Yes  Bluford Burkitt, NP  finasteride  (PROSCAR ) 5 MG tablet Take 5 mg by mouth daily.   Yes [provider]  ipratropium (ATROVENT ) 0.06 % nasal spray PLACE 2 SPRAYS INTO BOTH NOSTRILS 3 (THREE) TIMES DAILY 08/07/23  Yes Bluford Burkitt, NP  levothyroxine  (SYNTHROID ) 75 MCG tablet Take 1 tablet (75 mcg total) by mouth daily before breakfast. 30 minutes before food. Do not take with vitamins wait 4 hours after medication before vitamins 01/02/23  Yes Bluford Burkitt, NP  lisinopril  (ZESTRIL ) 20 MG tablet Take 1 tablet (20 mg total) by mouth in the morning and at bedtime. If BP>130/>80 take a dose hold if  BP <90-100/<60. D/c 10 01/02/23  Yes Bluford Burkitt, NP  LORazepam  (ATIVAN ) 1 MG tablet Take 0.5-1 tablets (0.5-1 mg total) by mouth every 8 (eight) hours as needed for anxiety. 07/03/23  Yes Bluford Burkitt, NP  Menatetrenone (VITAMIN K2 ) 100 MCG TABS Take 100 mcg by mouth daily.   Yes [provider]  minoxidil (LONITEN) 2.5 MG tablet Take 1.25 mg by mouth daily.   Yes [provider]  Multiple Vitamin (MULTIVITAMIN WITH MINERALS) TABS tablet Take 1 tablet by mouth daily.   Yes [provider]  Naltrexone HCl POWD Take by mouth. Placed in a capsule at night   Yes [provider]  pravastatin  (PRAVACHOL ) 40 MG tablet Take 1 tablet (40 mg total) by mouth  at bedtime. 01/02/23  Yes Bluford Burkitt, NP    Allergies as of 07/11/2023 - Review Complete 07/03/2023  Allergen Reaction Noted   Pioglitazone Swelling 02/25/2014    Family History  Problem Relation Age of Onset   Other Mother        legally blind   Hearing loss Mother    Heart disease Mother        MI   Hyperlipidemia Mother    Hypertension Mother    Kidney disease Mother    Cancer Father        lung smoker   Breast cancer Neg Hx     Social History   Socioeconomic History   Marital status: Widowed    Spouse name: Not on file   Number of children: Not on file   Years of education: Not on file   Highest  education level: Not on file  Occupational History   Occupation: Part Time  Tobacco Use   Smoking status: Never   Smokeless tobacco: Never  Vaping Use   Vaping status: Never Used  Substance and Sexual Activity   Alcohol use: Not Currently   Drug use: Never   Sexual activity: Not Currently  Other Topics Concern   Not on file  Social History Narrative   Widowed    No kids    No siblings    Never smoker   No pets   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 [employer]    Social Drivers of [employer]: Low Risk  (07/25/2023)   Overall Financial Resource Strain (CARDIA)    Difficulty of Paying Living Expenses: Not hard at all  Food Insecurity: No Food Insecurity (07/25/2023)   Hunger Vital Sign    Worried About Running Out of Food in the Last Year: Never true    Ran Out of Food in the Last Year: Never true  Transportation Needs: No Transportation Needs (07/25/2023)   PRAPARE - Administrator, Civil Service (Medical): No    Lack of Transportation (Non-Medical): No  Physical Activity: Inactive (07/25/2023)   Exercise Vital Sign    Days of Exercise per Week: 0 days    Minutes of Exercise per Session: 0 min  Stress: Stress Concern Present (07/25/2023)   [employer] of Occupational Health - Occupational Stress Questionnaire    Feeling of Stress : Very much  Social Connections: Moderately Isolated (07/25/2023)   Social Connection and Isolation Panel [NHANES]    Frequency of Communication with Friends and Family: More than three times a week    Frequency of Social Gatherings with Friends and Family: More than three times a week    Attends Religious Services: More than 4 times per year    Active Member of [employer] or Organizations: No    Attends Banker Meetings: Never    Marital Status: Widowed  Intimate Partner Violence: Not At Risk (07/25/2023)   Humiliation, Afraid, Rape, and Kick  questionnaire    Fear of Current or Ex-Partner: No    Emotionally Abused: No    Physically Abused: No    Sexually Abused: No    Review of Systems: See HPI, otherwise negative ROS  Physical Exam: BP 135/66   Pulse 60   Temp 97.9 F (36.6 C) (Temporal)   Resp 20   Wt 59.9 kg   SpO2 100%   BMI 21.97 kg/m  General:  Alert, cooperative. Head:  Normocephalic and atraumatic. Respiratory:  Normal work of breathing. Cardiovascular:  NAD  Impression/Plan: Helen Snow is here for cataract surgery.  Risks, benefits, limitations, and alternatives regarding cataract surgery have been reviewed with the patient.  Questions have been answered.  All parties agreeable.   Clair Crews, MD  08/19/2023, 8:35 AM

## 2023-09-02 ENCOUNTER — Telehealth: Payer: Self-pay

## 2023-09-02 ENCOUNTER — Other Ambulatory Visit (HOSPITAL_COMMUNITY): Payer: Self-pay

## 2023-09-02 NOTE — Telephone Encounter (Signed)
 Pt ready for scheduling for PROLIA  on or after : 10/02/23  Option# 1: Buy/Bill (Office supplied medication)  Out-of-pocket cost due at time of clinic visit: $332  Number of injection/visits approved: ---  Primary: [employer] Prolia  co-insurance: 20% Admin fee co-insurance: 0%  Secondary: --- Prolia  co-insurance:  Admin fee co-insurance:   Medical Benefit Details: Date Benefits were checked: 08/06/23 Deductible: NO/ Coinsurance: 20%/ Admin Fee: 20%  Prior Auth: N/A PA# Expiration Date:   # of doses approved: ----------------------------------------------------------------------- Option# 2- Med Obtained from pharmacy:  Pharmacy benefit: Copay $250 (Paid to pharmacy) Admin Fee: 0% (Pay at clinic)  Prior Auth: N/A PA# Expiration Date:   # of doses approved:   If patient wants fill through the pharmacy benefit please send prescription to: [employer] [facility], and include estimated need by date in rx notes. Pharmacy will ship medication directly to the office.  Patient NOT eligible for Prolia  Copay Card. Copay Card can make patient's cost as little as $25. Link to apply: [email or web address]://[email or web address]  ** This summary of benefits is an estimation of the patient's out-of-pocket cost. Exact cost may very based on individual plan coverage.

## 2023-09-11 ENCOUNTER — Other Ambulatory Visit: Payer: Self-pay | Admitting: *Deleted

## 2023-09-11 ENCOUNTER — Encounter: Payer: Self-pay | Admitting: *Deleted

## 2023-09-11 DIAGNOSIS — E559 Vitamin D deficiency, unspecified: Secondary | ICD-10-CM

## 2023-09-11 DIAGNOSIS — M810 Age-related osteoporosis without current pathological fracture: Secondary | ICD-10-CM

## 2023-09-26 DIAGNOSIS — L6611 Classic lichen planopilaris: Secondary | ICD-10-CM | POA: Diagnosis not present

## 2023-11-13 ENCOUNTER — Other Ambulatory Visit: Payer: Self-pay | Admitting: Nurse Practitioner

## 2023-11-13 DIAGNOSIS — E7800 Pure hypercholesterolemia, unspecified: Secondary | ICD-10-CM

## 2024-01-06 ENCOUNTER — Encounter: Payer: Self-pay | Admitting: Nurse Practitioner

## 2024-01-06 ENCOUNTER — Ambulatory Visit: Admitting: Nurse Practitioner

## 2024-01-06 VITALS — BP 116/68 | HR 60 | Temp 98.4°F | Ht 65.0 in | Wt 132.0 lb

## 2024-01-06 DIAGNOSIS — M810 Age-related osteoporosis without current pathological fracture: Secondary | ICD-10-CM | POA: Diagnosis not present

## 2024-01-06 DIAGNOSIS — I10 Essential (primary) hypertension: Secondary | ICD-10-CM | POA: Diagnosis not present

## 2024-01-06 DIAGNOSIS — E039 Hypothyroidism, unspecified: Secondary | ICD-10-CM | POA: Diagnosis not present

## 2024-01-06 DIAGNOSIS — F419 Anxiety disorder, unspecified: Secondary | ICD-10-CM | POA: Diagnosis not present

## 2024-01-06 DIAGNOSIS — E7800 Pure hypercholesterolemia, unspecified: Secondary | ICD-10-CM | POA: Diagnosis not present

## 2024-01-06 LAB — LIPID PANEL
Cholesterol: 166 mg/dL (ref 0–200)
HDL: 78.5 mg/dL (ref >39.00)
LDL Cholesterol: 76 mg/dL (ref 0–99)
NonHDL: 87.97
Total CHOL/HDL Ratio: 2
Triglycerides: 58 mg/dL (ref 0.0–149.0)
VLDL: 11.6 mg/dL (ref 0.0–40.0)

## 2024-01-06 LAB — COMPREHENSIVE METABOLIC PANEL WITH GFR
ALT: 21 U/L (ref 0–35)
AST: 29 U/L (ref 0–37)
Albumin: 4.6 g/dL (ref 3.5–5.2)
Alkaline Phosphatase: 41 U/L (ref 39–117)
BUN: 15 mg/dL (ref 6–23)
CO2: 31 meq/L (ref 19–32)
Calcium: 9.3 mg/dL (ref 8.4–10.5)
Chloride: 96 meq/L (ref 96–112)
Creatinine, Ser: 0.85 mg/dL (ref 0.40–1.20)
GFR: 68.41 mL/min (ref >60.00)
Glucose, Bld: 93 mg/dL (ref 70–99)
Potassium: 4.8 meq/L (ref 3.5–5.1)
Sodium: 134 meq/L — ABNORMAL LOW (ref 135–145)
Total Bilirubin: 0.6 mg/dL (ref 0.2–1.2)
Total Protein: 6.6 g/dL (ref 6.0–8.3)

## 2024-01-06 LAB — VITAMIN D 25 HYDROXY (VIT D DEFICIENCY, FRACTURES): VITD: 46.16 ng/mL (ref 30.00–100.00)

## 2024-01-06 LAB — TSH: TSH: 1.36 u[IU]/mL (ref 0.35–5.50)

## 2024-01-06 MED ORDER — PRAVASTATIN SODIUM 40 MG PO TABS: 40.0000 mg | ORAL_TABLET | Freq: Every day | ORAL | 3 refills | Status: AC

## 2024-01-06 MED ORDER — LEVOTHYROXINE SODIUM 75 MCG PO TABS: 75.0000 ug | ORAL_TABLET | Freq: Every day | ORAL | 3 refills | Status: AC

## 2024-01-06 MED ORDER — LORAZEPAM 1 MG PO TABS
0.5000 mg | ORAL_TABLET | Freq: Three times a day (TID) | ORAL | 5 refills | Status: AC | PRN
Start: 2024-01-06 — End: ?

## 2024-01-06 MED ORDER — LISINOPRIL 20 MG PO TABS: 20.0000 mg | ORAL_TABLET | Freq: Two times a day (BID) | ORAL | 3 refills | Status: AC

## 2024-01-06 MED ORDER — AMLODIPINE BESYLATE 5 MG PO TABS: 5.0000 mg | ORAL_TABLET | Freq: Two times a day (BID) | ORAL | 3 refills | Status: AC

## 2024-01-06 MED ORDER — DENOSUMAB-BBDZ 60 MG/ML ~~LOC~~ SOSY: 60.0000 mg | PREFILLED_SYRINGE | SUBCUTANEOUS | 1 refills | Status: AC

## 2024-01-06 NOTE — Assessment & Plan Note (Signed)
 Blood pressure is well-controlled with the current regimen. No concerning symptoms are reported. Continue lisinopril  20 mg and amlodipine  5 mg once daily. Monitor blood pressure at home and use a second dose of medication if blood pressure is elevated. Patient monitors blood pressures very closely at home. She does not always take the second dose of medications, only as needed for BP > 130/80. Lab work as outlined.

## 2024-01-06 NOTE — Assessment & Plan Note (Signed)
 Condition is managed with pravastatin  without new symptoms. Continue pravastatin  40 mg daily. Check lipid panel today.

## 2024-01-06 NOTE — Assessment & Plan Note (Signed)
 Managed with Ativan . Unable to tolerate antidepressants due to adverse side effect of hair loss. Symptoms stable at this time. Continue Ativan  0.5-1 mg every 8 hours as needed. PDMP reviewed. Refills sent. We will continue to monitor.

## 2024-01-06 NOTE — Assessment & Plan Note (Signed)
 Switch from Prolia  to Jubbonti due to insurance. Due for next injection in January. Check vitamin D  level today.

## 2024-01-06 NOTE — Assessment & Plan Note (Signed)
 Condition is managed with levothyroxine . She reports feeling cold but has no other symptoms. Continue levothyroxine  75 mcg daily. Check TSH today.

## 2024-01-06 NOTE — Progress Notes (Signed)
 Leron Glance, NP-C Phone: [phone]  Helen Snow is a 73 y.o. female who presents today for follow up.   Discussed the use of AI scribe software for clinical note transcription with the patient, who gave verbal consent to proceed.  History of Present Illness   Helen Snow is a 73 year old female who presents for medication refills and follow-up on her cataract surgery.  She recently underwent cataract surgery, resulting in significant improvement in her vision. She can now read without glasses, although she occasionally uses readers. Prior to the surgery, her vision was so impaired that she could not see clearly across the bathroom vanity.  She experiences ongoing hair loss, which she describes as worsening. She plans to consult with a dermatologist to address this issue. She avoids taking antidepressants due to concerns about hair loss.  She is currently taking lorazepam  for anxiety and crying spells but has been out of it for about two weeks. She also takes pravastatin  daily for cholesterol management and is out of refills for this medication. No abdominal pain is reported.  She takes levothyroxine  for thyroid  management and reports no skin or nail issues, heart palpitations, or significant temperature sensitivity, although she mentions feeling cold frequently.  She is on lisinopril  and amlodipine  for blood pressure management, which she states is well-controlled and often runs low. No chest pain, shortness of breath, dizziness, or swelling is reported.  She has been receiving Prolia  for osteoporosis but notes changes in her insurance coverage that will affect her ability to obtain it. She took her last dose in July and plans to receive her next dose in January.  Her vitamin D  levels have historically been low, and she takes approximately 3000 units daily. She recalls a time when her levels were below 30, requiring a prescription of high-dose vitamin D . She also mentions that her sodium  levels tend to run low.  Her diet includes oatmeal, vegetable sausage, turnip greens, and barbecue. She notes that she gets full quickly and prefers to eat lightly. She practices nasal rinsing with saline and filtered water, which she believes helps prevent illness.      Social History   Tobacco Use  Smoking Status Never  Smokeless Tobacco Never    Current Outpatient Medications on File Prior to Visit  Medication Sig Dispense Refill   amoxicillin  (AMOXIL ) 500 MG capsule Take 4 capsules (2,000 mg total) by mouth once as needed for up to 1 dose. 1 hour with food before dental procedure due to hip replacement 20 capsule 0   ascorbic acid  (VITAMIN C) 500 MG tablet Take 500 mg by mouth 2 (two) times daily.     Calcium  Carbonate-Vit D-Min (CALCIUM  1200 PO) Take 1 tablet by mouth daily.      finasteride  (PROSCAR ) 5 MG tablet Take 5 mg by mouth daily.     ipratropium (ATROVENT ) 0.06 % nasal spray PLACE 2 SPRAYS INTO BOTH NOSTRILS 3 (THREE) TIMES DAILY 15 mL 1   Menatetrenone (VITAMIN K2 ) 100 MCG TABS Take 100 mcg by mouth daily.     minoxidil (LONITEN) 2.5 MG tablet Take 1.25 mg by mouth daily.     Multiple Vitamin (MULTIVITAMIN WITH MINERALS) TABS tablet Take 1 tablet by mouth daily.     Naltrexone HCl POWD Take by mouth. Placed in a capsule at night     No current facility-administered medications on file prior to visit.     ROS see history of present illness  Objective  Physical Exam Vitals:  01/06/24 1313  BP: 116/68  Pulse: 60  Temp: 98.4 F (36.9 C)  SpO2: 99%    BP Readings from Last 3 Encounters:  01/06/24 116/68  08/19/23 117/62  08/05/23 124/62   Wt Readings from Last 3 Encounters:  01/06/24 132 lb (59.9 kg)  08/19/23 132 lb (59.9 kg)  08/05/23 130 lb (59 kg)    Physical Exam Constitutional:      General: She is not in acute distress.    Appearance: Normal appearance.  HENT:     Head: Normocephalic.  Cardiovascular:     Rate and Rhythm: Normal rate and  regular rhythm.     Heart sounds: Normal heart sounds.  Pulmonary:     Effort: Pulmonary effort is normal.     Breath sounds: Normal breath sounds.  Skin:    General: Skin is warm and dry.  Neurological:     General: No focal deficit present.     Mental Status: She is alert.  Psychiatric:        Mood and Affect: Mood normal.        Behavior: Behavior normal.      Assessment/Plan: Please see individual problem list.  Hypothyroidism, unspecified type Assessment & Plan: Condition is managed with levothyroxine . She reports feeling cold but has no other symptoms. Continue levothyroxine  75 mcg daily. Check TSH today.   Orders: -     TSH -     Levothyroxine  Sodium; Take 1 tablet (75 mcg total) by mouth daily before breakfast. 30 minutes before food. Do not take with vitamins wait 4 hours after medication before vitamins  Dispense: 100 tablet; Refill: 3  Essential hypertension Assessment & Plan: Blood pressure is well-controlled with the current regimen. No concerning symptoms are reported. Continue lisinopril  20 mg and amlodipine  5 mg once daily. Monitor blood pressure at home and use a second dose of medication if blood pressure is elevated. Patient monitors blood pressures very closely at home. She does not always take the second dose of medications, only as needed for BP > 130/80. Lab work as outlined.   Orders: -     Comprehensive metabolic panel with GFR -     amLODIPine  Besylate; Take 1 tablet (5 mg total) by mouth in the morning and at bedtime. If BP >130/>80  Dispense: 200 tablet; Refill: 3 -     Lisinopril ; Take 1 tablet (20 mg total) by mouth in the morning and at bedtime. If BP>130/>80 take a dose hold if  BP <90-100/<60. D/c 10  Dispense: 200 tablet; Refill: 3  Osteoporosis, unspecified osteoporosis type, unspecified pathological fracture presence Assessment & Plan: Switch from Prolia  to Jubbonti due to insurance. Due for next injection in January. Check vitamin D  level  today.  Orders: -     VITAMIN D  25 Hydroxy (Vit-D Deficiency, Fractures) -     Denosumab -bbdz; Inject 60 mg into the skin every 6 (six) months.  Dispense: 1 mL; Refill: 1  Anxiety Assessment & Plan: Managed with Ativan . Unable to tolerate antidepressants due to adverse side effect of hair loss. Symptoms stable at this time. Continue Ativan  0.5-1 mg every 8 hours as needed. PDMP reviewed. Refills sent. We will continue to monitor.   Orders: -     LORazepam ; Take 0.5-1 tablets (0.5-1 mg total) by mouth every 8 (eight) hours as needed for anxiety.  Dispense: 90 tablet; Refill: 5  Pure hypercholesterolemia Assessment & Plan: Condition is managed with pravastatin  without new symptoms. Continue pravastatin  40 mg daily. Check lipid  panel today.   Orders: -     Lipid panel -     Pravastatin  Sodium; Take 1 tablet (40 mg total) by mouth at bedtime.  Dispense: 100 tablet; Refill: 3      Return in about 6 months (around 07/06/2024) for Follow up.   Leron Glance, NP-C [facility]

## 2024-01-07 ENCOUNTER — Ambulatory Visit: Payer: Self-pay | Admitting: Nurse Practitioner

## 2024-01-07 ENCOUNTER — Ambulatory Visit
Admission: RE | Admit: 2024-01-07 | Discharge: 2024-01-07 | Disposition: A | Discharge status: Home / Self Care | Source: Ambulatory Visit | Attending: Nurse Practitioner | Admitting: Nurse Practitioner

## 2024-01-07 DIAGNOSIS — Z1231 Encounter for screening mammogram for malignant neoplasm of breast: Secondary | ICD-10-CM | POA: Diagnosis not present

## 2024-01-13 ENCOUNTER — Ambulatory Visit: Payer: Self-pay | Admitting: Nurse Practitioner

## 2024-01-28 DIAGNOSIS — L538 Other specified erythematous conditions: Secondary | ICD-10-CM | POA: Diagnosis not present

## 2024-01-28 DIAGNOSIS — L6612 Frontal fibrosing alopecia: Secondary | ICD-10-CM | POA: Diagnosis not present

## 2024-01-28 DIAGNOSIS — L6611 Classic lichen planopilaris: Secondary | ICD-10-CM | POA: Diagnosis not present

## 2024-07-07 ENCOUNTER — Ambulatory Visit: Admitting: Nurse Practitioner

## 2024-07-28 ENCOUNTER — Ambulatory Visit
